# Patient Record
Sex: Female | Born: 1960 | Race: White | Hispanic: No | Smoking: Former smoker
Health system: Southern US, Community
[De-identification: ages and names within clinical notes are randomized; demographics above are authoritative.]

## PROBLEM LIST (undated history)

## (undated) DIAGNOSIS — I499 Cardiac arrhythmia, unspecified: Secondary | ICD-10-CM

## (undated) DIAGNOSIS — K589 Irritable bowel syndrome without diarrhea: Secondary | ICD-10-CM

## (undated) DIAGNOSIS — K5790 Diverticulosis of intestine, part unspecified, without perforation or abscess without bleeding: Secondary | ICD-10-CM

## (undated) DIAGNOSIS — E119 Type 2 diabetes mellitus without complications: Secondary | ICD-10-CM

## (undated) DIAGNOSIS — I10 Essential (primary) hypertension: Secondary | ICD-10-CM

## (undated) DIAGNOSIS — K219 Gastro-esophageal reflux disease without esophagitis: Secondary | ICD-10-CM

## (undated) DIAGNOSIS — E7800 Pure hypercholesterolemia, unspecified: Secondary | ICD-10-CM

## (undated) DIAGNOSIS — M797 Fibromyalgia: Secondary | ICD-10-CM

## (undated) DIAGNOSIS — I509 Heart failure, unspecified: Secondary | ICD-10-CM

## (undated) DIAGNOSIS — Q631 Lobulated, fused and horseshoe kidney: Secondary | ICD-10-CM

## (undated) DIAGNOSIS — IMO0002 Reserved for concepts with insufficient information to code with codable children: Secondary | ICD-10-CM

## (undated) DIAGNOSIS — F419 Anxiety disorder, unspecified: Secondary | ICD-10-CM

## (undated) HISTORY — PX: TONSILLECTOMY: SUR1361

## (undated) HISTORY — DX: Type 2 diabetes mellitus without complications: E11.9

## (undated) HISTORY — DX: Pure hypercholesterolemia, unspecified: E78.00

## (undated) HISTORY — DX: Lobulated, fused and horseshoe kidney: Q63.1

## (undated) HISTORY — PX: TUBAL LIGATION: SHX77

## (undated) HISTORY — PX: WRIST SURGERY: SHX841

## (undated) HISTORY — DX: Reserved for concepts with insufficient information to code with codable children: IMO0002

## (undated) HISTORY — PX: OTHER SURGICAL HISTORY: SHX169

---

## 2001-09-16 ENCOUNTER — Inpatient Hospital Stay (HOSPITAL_COMMUNITY): Admission: EM | Admit: 2001-09-16 | Discharge: 2001-09-19 | Payer: Self-pay | Admitting: Emergency Medicine

## 2001-09-16 ENCOUNTER — Encounter: Payer: Self-pay | Admitting: Emergency Medicine

## 2001-11-27 ENCOUNTER — Inpatient Hospital Stay (HOSPITAL_COMMUNITY): Admission: EM | Admit: 2001-11-27 | Discharge: 2001-11-28 | Payer: Self-pay | Admitting: Emergency Medicine

## 2001-11-28 ENCOUNTER — Encounter: Payer: Self-pay | Admitting: *Deleted

## 2001-12-31 ENCOUNTER — Ambulatory Visit (HOSPITAL_COMMUNITY): Admission: RE | Admit: 2001-12-31 | Discharge: 2001-12-31 | Payer: Self-pay | Admitting: Family Medicine

## 2001-12-31 ENCOUNTER — Encounter: Payer: Self-pay | Admitting: Family Medicine

## 2002-01-23 ENCOUNTER — Ambulatory Visit (HOSPITAL_COMMUNITY): Admission: RE | Admit: 2002-01-23 | Discharge: 2002-01-23 | Payer: Self-pay | Admitting: *Deleted

## 2002-01-23 ENCOUNTER — Encounter: Payer: Self-pay | Admitting: *Deleted

## 2002-04-23 ENCOUNTER — Encounter: Payer: Self-pay | Admitting: Family Medicine

## 2002-04-23 ENCOUNTER — Ambulatory Visit (HOSPITAL_COMMUNITY): Admission: RE | Admit: 2002-04-23 | Discharge: 2002-04-23 | Payer: Self-pay | Admitting: Family Medicine

## 2002-08-01 ENCOUNTER — Emergency Department (HOSPITAL_COMMUNITY): Admission: EM | Admit: 2002-08-01 | Discharge: 2002-08-02 | Payer: Self-pay

## 2003-01-26 ENCOUNTER — Encounter: Payer: Self-pay | Admitting: *Deleted

## 2003-01-26 ENCOUNTER — Emergency Department (HOSPITAL_COMMUNITY): Admission: EM | Admit: 2003-01-26 | Discharge: 2003-01-26 | Payer: Self-pay | Admitting: *Deleted

## 2003-06-06 ENCOUNTER — Emergency Department (HOSPITAL_COMMUNITY): Admission: EM | Admit: 2003-06-06 | Discharge: 2003-06-07 | Payer: Self-pay | Admitting: *Deleted

## 2004-02-09 ENCOUNTER — Emergency Department (HOSPITAL_COMMUNITY): Admission: EM | Admit: 2004-02-09 | Discharge: 2004-02-09 | Payer: Self-pay | Admitting: Emergency Medicine

## 2005-09-01 ENCOUNTER — Ambulatory Visit (HOSPITAL_COMMUNITY): Admission: RE | Admit: 2005-09-01 | Discharge: 2005-09-01 | Payer: Self-pay | Admitting: Emergency Medicine

## 2006-08-10 ENCOUNTER — Emergency Department (HOSPITAL_COMMUNITY): Admission: EM | Admit: 2006-08-10 | Discharge: 2006-08-11 | Payer: Self-pay | Admitting: Emergency Medicine

## 2009-06-29 ENCOUNTER — Encounter: Payer: Self-pay | Admitting: Gastroenterology

## 2009-06-29 ENCOUNTER — Ambulatory Visit: Payer: Self-pay | Admitting: Gastroenterology

## 2009-06-29 DIAGNOSIS — K219 Gastro-esophageal reflux disease without esophagitis: Secondary | ICD-10-CM | POA: Insufficient documentation

## 2009-06-29 DIAGNOSIS — J019 Acute sinusitis, unspecified: Secondary | ICD-10-CM | POA: Insufficient documentation

## 2009-06-29 DIAGNOSIS — K648 Other hemorrhoids: Secondary | ICD-10-CM

## 2009-06-29 DIAGNOSIS — K589 Irritable bowel syndrome without diarrhea: Secondary | ICD-10-CM

## 2009-06-29 DIAGNOSIS — R109 Unspecified abdominal pain: Secondary | ICD-10-CM | POA: Insufficient documentation

## 2009-06-30 ENCOUNTER — Telehealth (INDEPENDENT_AMBULATORY_CARE_PROVIDER_SITE_OTHER): Payer: Self-pay | Admitting: *Deleted

## 2009-07-01 ENCOUNTER — Ambulatory Visit: Payer: Self-pay | Admitting: Gastroenterology

## 2009-07-01 ENCOUNTER — Ambulatory Visit (HOSPITAL_COMMUNITY): Admission: RE | Admit: 2009-07-01 | Discharge: 2009-07-01 | Payer: Self-pay | Admitting: Gastroenterology

## 2009-07-01 HISTORY — PX: ESOPHAGOGASTRODUODENOSCOPY: SHX1529

## 2009-07-01 HISTORY — PX: COLONOSCOPY: SHX174

## 2009-08-17 ENCOUNTER — Encounter: Payer: Self-pay | Admitting: Gastroenterology

## 2009-10-29 ENCOUNTER — Ambulatory Visit (HOSPITAL_COMMUNITY): Admission: RE | Admit: 2009-10-29 | Discharge: 2009-10-29 | Payer: Self-pay | Admitting: Internal Medicine

## 2009-11-10 ENCOUNTER — Ambulatory Visit (HOSPITAL_COMMUNITY): Admission: RE | Admit: 2009-11-10 | Discharge: 2009-11-10 | Payer: Self-pay | Admitting: Internal Medicine

## 2009-11-12 ENCOUNTER — Emergency Department (HOSPITAL_COMMUNITY): Admission: EM | Admit: 2009-11-12 | Discharge: 2009-11-12 | Payer: Self-pay | Admitting: Emergency Medicine

## 2010-08-09 NOTE — Letter (Signed)
Summary: [facility]  NOTES [facility]   Imported By: Diana Eves 08/17/2009 16:07:03  _____________________________________________________________________  External Attachment:    Type:   Image     Comment:   External Document

## 2010-10-10 LAB — BASIC METABOLIC PANEL
BUN: 8 mg/dL (ref 6–23)
CO2: 24 mEq/L (ref 19–32)
Calcium: 9.5 mg/dL (ref 8.4–10.5)
Chloride: 108 mEq/L (ref 96–112)
Creatinine, Ser: 0.79 mg/dL (ref 0.4–1.2)
GFR calc Af Amer: >60 mL/min (ref >60)
GFR calc non Af Amer: >60 mL/min (ref >60)
Glucose, Bld: 165 mg/dL — ABNORMAL HIGH (ref 70–99)
Potassium: 3.8 mEq/L (ref 3.5–5.1)
Sodium: 139 mEq/L (ref 135–145)

## 2010-10-10 LAB — HEMOGLOBIN AND HEMATOCRIT, BLOOD
HCT: 35.8 % — ABNORMAL LOW (ref 36.0–46.0)
Hemoglobin: 12.3 g/dL (ref 12.0–15.0)

## 2010-11-23 ENCOUNTER — Emergency Department (HOSPITAL_COMMUNITY)
Admission: EM | Admit: 2010-11-23 | Discharge: 2010-11-23 | Disposition: A | Discharge status: Home / Self Care | Payer: Self-pay | Attending: Emergency Medicine | Admitting: Emergency Medicine

## 2010-11-23 DIAGNOSIS — F329 Major depressive disorder, single episode, unspecified: Secondary | ICD-10-CM | POA: Insufficient documentation

## 2010-11-23 DIAGNOSIS — Z79899 Other long term (current) drug therapy: Secondary | ICD-10-CM | POA: Insufficient documentation

## 2010-11-23 DIAGNOSIS — F411 Generalized anxiety disorder: Secondary | ICD-10-CM | POA: Insufficient documentation

## 2010-11-23 DIAGNOSIS — K589 Irritable bowel syndrome without diarrhea: Secondary | ICD-10-CM | POA: Insufficient documentation

## 2010-11-23 DIAGNOSIS — F3289 Other specified depressive episodes: Secondary | ICD-10-CM | POA: Insufficient documentation

## 2010-11-23 LAB — DIFFERENTIAL
Basophils Absolute: 0 10*3/uL (ref 0.0–0.1)
Basophils Relative: 0 % (ref 0–1)
Eosinophils Absolute: 0.1 10*3/uL (ref 0.0–0.7)
Lymphs Abs: 1.9 10*3/uL (ref 0.7–4.0)
Monocytes Absolute: 0.6 10*3/uL (ref 0.1–1.0)
Neutro Abs: 5 10*3/uL (ref 1.7–7.7)

## 2010-11-23 LAB — BASIC METABOLIC PANEL
BUN: 11 mg/dL (ref 6–23)
Calcium: 10.9 mg/dL — ABNORMAL HIGH (ref 8.4–10.5)
Chloride: 105 mEq/L (ref 96–112)
Creatinine, Ser: 0.82 mg/dL (ref 0.4–1.2)
GFR calc Af Amer: >60 mL/min (ref >60)
GFR calc non Af Amer: >60 mL/min (ref >60)
Glucose, Bld: 108 mg/dL — ABNORMAL HIGH (ref 70–99)

## 2010-11-23 LAB — CBC
Hemoglobin: 13 g/dL (ref 12.0–15.0)
MCH: 27.7 pg (ref 26.0–34.0)
RBC: 4.69 MIL/uL (ref 3.87–5.11)
WBC: 7.6 10*3/uL (ref 4.0–10.5)

## 2010-11-23 LAB — RAPID URINE DRUG SCREEN, HOSP PERFORMED
Barbiturates: NOT DETECTED
Benzodiazepines: POSITIVE — AB
Tetrahydrocannabinol: NOT DETECTED

## 2010-11-23 LAB — ETHANOL: Alcohol, Ethyl (B): <11 mg/dL — ABNORMAL HIGH (ref 0–10)

## 2010-11-25 NOTE — H&P (Signed)
[facility]  Patient:    [name], [name] Visit Number: [id number] MRN: [id number]          Service Type: MED Location: 3A Y782 01 Attending Physician:  Hilario Quarry Dictated by:   Patrica Duel, M.D. Admit Date:  09/16/2001                           History and Physical  CHIEF COMPLAINT:  Flank pain.  HISTORY OF PRESENT ILLNESS:  This is a 50 year old female with a history of irritable bowel syndrome, cervical dysplasia, horseshoe kidney, status post tonsillectomy in the distant past.  The patient presented to the emergency department with a 10- to 14-day history of increasingly severe left flank pain.  She denies significant GI symptoms except for scant hematochezia.  There is no nausea, vomiting, diarrhea, melena, hematemesis, fever, chills or urinary symptoms.  She also denies headache, neurologic deficits, chest pain, shortness of breath, cough.  Emergency room workup was essentially benign with normal laboratory which included liver functions, amylase, lipase, CBC, MET-7, etc.  A CT scan with contrast was obtained.  No urologic abnormalities were noted other than horseshoe kidney.  No evidence of obstruction.  The verbal report I received stated that there was a possible adenitis involving the left retroperitoneum.  The patient is admitted for full workup of ongoing flank and abdominal pain. Of note, stool is heme-positive in ED.  CURRENT MEDICATIONS:  None.  ALLERGIES:  CODEINE.  PAST MEDICAL HISTORY:  As noted above.  SOCIAL HISTORY:  Nonsmoker (quit three years ago).  No alcohol or illicit drugs.  FAMILY HISTORY:  Positive for colorectal cancer.  REVIEW OF SYSTEMS:  Negative except as mentioned.  PHYSICAL EXAMINATION:  GENERAL:  A very pleasant, fully alert female in no acute distress.  VITAL SIGNS:  Temperature 97.6, pulse 98 and regular, respirations 24, blood pressure 115/59.  HEENT:  Normocephalic, atraumatic.  The  pupils are equal.  There is no scleral icterus.  Ears, nose and throat are benign.  Mucous membranes are moist.  NECK:  Supple without thyromegaly or lymphadenopathy or bruits.  LUNGS:  Clear to A&P.  HEART:  Normal without murmurs, rubs, or gallops.  ABDOMEN:  Soft.  There is moderate tenderness in the left upper quadrant.  She also has some tenderness in the costochondral margin on the left.  EXTREMITIES:  No clubbing, cyanosis, or edema.  PELVIC:  Pelvic exam performed by Dr. Margarita Grizzle reportedly benign.  ASSESSMENT:  Flank and abdominal pain of questionable etiology.  Mesenteric adenitis a possibility.  Note:  Colonoscopy done by Dr. Vania Rea. Patterson in 1996 benign.  PLAN:  Admit for further workup, empiric antibiotics, GI consult.  Will follow and treat expectantly. Dictated by:   Patrica Duel, M.D. Attending Physician:  Hilario Quarry DD:  09/16/01 TD:  09/17/01 Job: [id number]

## 2010-11-25 NOTE — Procedures (Signed)
[facility]  Patient:    [name], [name] Visit Number: [id number] MRN: [id number]          Service Type: OUT Location: RAD Attending Physician:  Netta Cedars Dictated by:   Annamaria Boots, M.D. Proc. Date: 01/23/02 Admit Date:  01/23/2002 Discharge Date: 01/23/2002   CC:         Patrica Duel, M.D.   Stress Test  PROCEDURE:  Stress portion of nuclear stress test.  INDICATIONS:  [name] is a 50 year old white female who had been experiencing chest discomfort, palpitations, and fatigue.  She is subsequently referred for nuclear stress test to evaluate for the possibility of cardiac ischemia.  Persantine Technetium-Based 36m Cardiolite Myocardial perfusion Study:  The patient performed the standard  exercise/pharmocologic stress protocol.  Electrocardiographic and Hematologic Data:  Resting blood pressure was 102/76 while the heart rate was 88 beats per minute.  Baseline 12-lead electrocardiogram revealed normal sinus rhythm at 88 beats per minute with nonspecific ST changes.  Her blood pressure remained stable during the procedure.  There were no additional electrocardiographic changes during the procedure.  She did have some chest discomfort with the Persantine.  These symptoms resolved with admission of IV Aminophylline.  IMPRESSION: 1. Stress portion of nuclear stress test performed without difficulty. 2. Clinically positive for chest discomfort likely secondary to Persantine. 3. Blood pressure and heart rate remained stable. 4. Cineographic images are pending. Dictated by:   Annamaria Boots, M.D. Attending Physician:  Netta Cedars DD:  01/23/02 TD:  01/28/02 Job: [id number]

## 2010-11-25 NOTE — Discharge Summary (Signed)
[facility]  Patient:    [name], [name] Visit Number: [id number] MRN: [id number]          Service Type: MED Location: 3A A319 01 Attending Physician:  Patrica Duel Dictated by:   Patrica Duel, M.D. Admit Date:  09/16/2001 Discharge Date: 09/19/2001                             Discharge Summary  DISCHARGE DIAGNOSES: 1. Flank and abdominal pain of questionable etiology. 2. Mesenteric stranding noted on the CT. 3. Possible panniculitis, responsive to antibiotics.  BRIEF HISTORY:  Details regarding admission please refer to admitting H&P. This 50 year old female with a history of irritable bowel syndrome, cervical dysplasia, horseshoe kidney, status post tonsillectomy in the distant past presented to the emergency department with a 10-14 day history of increasingly severe left flank pain. There were no significant GI symptoms except for scant hematochezia. There was no nausea, vomiting, diarrhea, melena, hematemesis, fever, chills, and urinary symptoms. She also denies headache, neurologic deficits, chest pain, shortness of breath, and cough.  In the emergency department, her workup was essentially benign with normal labs which included liver function, amylase, lipase, cbc, MET-7, etc. A CT scan with contrast was obtained. No urologic abnormalities were noted other than horseshoe kidney and there was no evidence of obstruction. The verbal report I received stated there was a possible adenitis involving the left retroperitoneum. She was admitted for full workup of ongoing flank and abdominal pain. Of note, stool is heme-positive in ED. She also has a history of colorectal carcinoma.  HOSPITAL COURSE:  The patient was admitted and placed on empiric Cipro and Flagyl. She was also placed on PPI. She improved though she did require frequent parenteral narcotics for pain relief. Dr. Karilyn Cota was consulted, and he felt that it could possible by focal  panniculitis. Other possibilities include diverticulitis, pancreatitis, or focal hemorrhage in this area.  The patient continued to improve and was discharged on the third hospital day in much improved condition.  DISPOSITION:  Continue Cipro and Flagyl and Tylox p.r.n. She will be seen by Dr. Karilyn Cota for colonoscopy in the near future. Will follow and treat expectantly as an outpatient. Dictated by:   Patrica Duel, M.D. Attending Physician:  Patrica Duel DD:  10/22/01 TD:  10/22/01 Job: [id number]

## 2010-11-25 NOTE — Discharge Summary (Signed)
[facility]  Patient:    [name], [name] Visit Number: [id number] MRN: [id number]          Service Type: MED Location: 2A A202 01 Attending Physician:  Cassell Smiles. Dictated by:   Elfredia Nevins, M.D. Admit Date:  11/27/2001 Discharge Date: 11/28/2001                             Discharge Summary  DISCHARGE DIAGNOSES:  Chest pain.  DISCHARGE MEDICATIONS: 1. Enteric-coated aspirin 325 mg p.o. q.d. 2. Lopressor 12.5 mg p.o. b.i.d. 3. Nitroglycerin sublingual p.r.n.  SUMMARY:  The patient was admitted with chest discomfort described as intermittent and somewhat exertional in nature.  She was seen in the emergency department after being sent in by the Health Department where she normally receives her care.  She also had complaints of palpitations associated with this.  The patient ruled out effectively with serial enzymes and was seen in consultation by cardiology who agreed with discharge for subsequent outpatient stress test. Dictated by:   Elfredia Nevins, M.D. Attending Physician:  Cassell Smiles DD:  12/13/01 TD:  12/16/01 Job: [id number]

## 2010-11-25 NOTE — H&P (Signed)
[facility]  Patient:    SHARYN, BRILLIANT Visit Number: [id number] MRN: [id number]          Service Type: MED Location: 2A A202 01 Attending Physician:  Cassell Smiles. Dictated by:   Elfredia Nevins, M.D. Admit Date:  11/27/2001 Discharge Date: 11/28/2001                           History and Physical  DATE OF BIRTH:  26-Dec-1960  CHIEF COMPLAINT:  Chest pain.  HISTORY OF PRESENT ILLNESS:  The patient had chest discomfort that has been intermittent and somewhat exertional in nature.  She presented to the emergency department today after being sent in by the health department, where she normally receives her care, with complaints of an irregular heartbeat and exertional pain while mowing the lawn.  She had some shortness of breath associated with this.  No diaphoresis or syncope.  No vomiting.  PAST MEDICAL HISTORY:  Questionable arrhythmia, not documented or worked up at present.  Otherwise, benign.  SOCIAL HISTORY:  Former cigarette smoking, has not smoked in multiple years, x4.  No alcohol use.  No drug use.  FAMILY HISTORY:  Positive for coronary artery disease in several maternal and paternal grandparents.  REVIEW OF SYSTEMS:  Otherwise negative.  PHYSICAL EXAMINATION:  SKIN:  Unremarkable.  HEENT AND NECK:  No JVD or adenopathy.  Neck is supple.  CHEST:  Clear.  CARDIAC:  Regular rhythm without murmur, gallop, or rub.  ABDOMEN:  Soft.  No organomegaly or masses.  EXTREMITIES:  Without clubbing, cyanosis, or edema.  NEUROLOGIC:  Nonfocal.  LABORATORY DATA:  Initial cardiac enzymes negative.  Normal CBC.  Electrolytes unrevealing, normal calcium.  Electrocardiogram reveals normal sinus rhythm with mild nonspecific ST and T-wave changes in V1, 2, and 3.  No definite ischemic or infarction changes. No old EKG was available for comparison.  Chest x-ray report to me is negative by Dr. Rosalia Hammers.  Will review when  available.  IMPRESSION:  Chest pain, exertional precipitation, with positive risk factors. Admit for rule out MI protocol.  Close observation.  Cardiology consultation. Dictated by:   Elfredia Nevins, M.D. Attending Physician:  Cassell Smiles DD:  11/27/01 TD:  11/29/01 Job: [id number]

## 2010-12-09 ENCOUNTER — Inpatient Hospital Stay (HOSPITAL_COMMUNITY)
Admission: EM | Admit: 2010-12-09 | Discharge: 2010-12-10 | DRG: 603 | Disposition: A | Discharge status: Home / Self Care | Payer: Self-pay | Attending: Internal Medicine | Admitting: Internal Medicine

## 2010-12-09 ENCOUNTER — Emergency Department (HOSPITAL_COMMUNITY): Payer: Self-pay

## 2010-12-09 DIAGNOSIS — F329 Major depressive disorder, single episode, unspecified: Secondary | ICD-10-CM | POA: Diagnosis present

## 2010-12-09 DIAGNOSIS — T368X5A Adverse effect of other systemic antibiotics, initial encounter: Secondary | ICD-10-CM | POA: Diagnosis not present

## 2010-12-09 DIAGNOSIS — S61409A Unspecified open wound of unspecified hand, initial encounter: Secondary | ICD-10-CM | POA: Diagnosis present

## 2010-12-09 DIAGNOSIS — W268XXA Contact with other sharp object(s), not elsewhere classified, initial encounter: Secondary | ICD-10-CM | POA: Diagnosis present

## 2010-12-09 DIAGNOSIS — F411 Generalized anxiety disorder: Secondary | ICD-10-CM | POA: Diagnosis present

## 2010-12-09 DIAGNOSIS — L270 Generalized skin eruption due to drugs and medicaments taken internally: Secondary | ICD-10-CM | POA: Diagnosis not present

## 2010-12-09 DIAGNOSIS — L02519 Cutaneous abscess of unspecified hand: Principal | ICD-10-CM | POA: Diagnosis present

## 2010-12-09 DIAGNOSIS — F3289 Other specified depressive episodes: Secondary | ICD-10-CM | POA: Diagnosis present

## 2010-12-09 LAB — DIFFERENTIAL
Eosinophils Relative: 2 % (ref 0–5)
Lymphocytes Relative: 27 % (ref 12–46)
Lymphs Abs: 1.9 10*3/uL (ref 0.7–4.0)
Neutro Abs: 4.4 10*3/uL (ref 1.7–7.7)
Neutrophils Relative %: 63 % (ref 43–77)

## 2010-12-09 LAB — CBC
HCT: 37.6 % (ref 36.0–46.0)
Hemoglobin: 12.6 g/dL (ref 12.0–15.0)
MCH: 28.2 pg (ref 26.0–34.0)
Platelets: 145 10*3/uL — ABNORMAL LOW (ref 150–400)

## 2010-12-09 LAB — COMPREHENSIVE METABOLIC PANEL
BUN: 13 mg/dL (ref 6–23)
CO2: 27 mEq/L (ref 19–32)
Creatinine, Ser: 0.94 mg/dL (ref 0.4–1.2)
GFR calc Af Amer: >60 mL/min (ref >60)
Glucose, Bld: 97 mg/dL (ref 70–99)
Potassium: 3.7 mEq/L (ref 3.5–5.1)
Sodium: 139 mEq/L (ref 135–145)
Total Bilirubin: 0.3 mg/dL (ref 0.3–1.2)
Total Protein: 7 g/dL (ref 6.0–8.3)

## 2010-12-10 LAB — BASIC METABOLIC PANEL
BUN: 11 mg/dL (ref 6–23)
CO2: 25 mEq/L (ref 19–32)
Calcium: 9.9 mg/dL (ref 8.4–10.5)
GFR calc non Af Amer: >60 mL/min (ref >60)
Glucose, Bld: 141 mg/dL — ABNORMAL HIGH (ref 70–99)
Potassium: 4.1 mEq/L (ref 3.5–5.1)
Sodium: 141 mEq/L (ref 135–145)

## 2010-12-10 LAB — DIFFERENTIAL
Basophils Absolute: 0 10*3/uL (ref 0.0–0.1)
Basophils Relative: 0 % (ref 0–1)
Eosinophils Absolute: 0.1 10*3/uL (ref 0.0–0.7)
Eosinophils Relative: 1 % (ref 0–5)
Lymphocytes Relative: 12 % (ref 12–46)

## 2010-12-10 LAB — CBC
MCV: 84.5 fL (ref 78.0–100.0)
RDW: 14.9 % (ref 11.5–15.5)

## 2010-12-10 NOTE — Discharge Summary (Signed)
  [name], [name]             ACCOUNT NO.:  [id number]  MEDICAL RECORD NO.:  [id number]           PATIENT TYPE:  I  LOCATION:  A330                          FACILITY:  [facility]  PHYSICIAN:  Wilson Singer, M.D.DATE OF BIRTH:  1961-01-30  DATE OF ADMISSION:  12/09/2010 DATE OF DISCHARGE:  06/02/2012LH                              DISCHARGE SUMMARY   FINAL DISCHARGE DIAGNOSES: 1. Right hand cellulitis. 2. Puncture wound to the right hand by nail 3. Anxiety and depression.  CONDITION ON DISCHARGE:  Stable.  MEDICATIONS ON DISCHARGE:  She will continue her home medications, which include Xanax 0.5 mg at bedtime, Flexeril p.r.n., Maxalt p.r.n. for anxiety and also a new prescription of Levaquin 500 mg daily for 1 week.  HISTORY OF PRESENT ILLNESS:  This 50 year old lady was admitted yesterday after she had complained of fever and chills and pain in the right hand.  Please see initial history and physical examination done byDr. Vedia Coffer.  HOSPITAL PROGRESS:  The patient had a nail puncture Mark in her right hand and she had sustained cellulitis from this.  Her last tetanus shot was 10 years ago and she had been given an updated tetanus shot here now.  She feels much improved and her cellulitis and redness have significantly subsided.  She did have an adverse reaction to intravenous vancomycin and this should be listed in any of her allergy medications. Today, she looks well.  She feels well.  PHYSICAL EXAMINATION:  VITAL SIGNS:  Temperature 98.5, blood pressure 120/79, pulse 80, saturation 100%.  HEART:  Sounds are present.  LUNGS: Normal lung fields are clear.  Examination of the right hand show some minimal cellulitis now around the site of the puncture wound from the nail.  This is greatly improved from the extent of cellulitis previously.  INVESTIGATIONS:  Hemoglobin 12.2, white blood cell count 7.4, platelets of 134.  Sodium 141, potassium 4.1, bicarbonate 25, BUN  11, creatinine 0.68.  DISPOSITION:  The patient is stable to be discharged home, but she will need a 1-week course of antibiotics and I have given her prescription for Levaquin 500 mg daily for a week.  She needs to follow up with her primary care physician's, which I think her the Health Department within a week or so.     Wilson Singer, M.D.     NCG/MEDQ  D:  12/10/2010  T:  12/10/2010  Job:  [id number]  Electronically Signed by Lilly Cove M.D. on 12/10/2010 02:16:09 PM

## 2010-12-11 NOTE — H&P (Signed)
[name], [name]             ACCOUNT NO.:  [id number]  MEDICAL RECORD NO.:  [id number]           PATIENT TYPE:  I  LOCATION:  A330                          FACILITY:  [facility]  PHYSICIAN:  Vania Rea, M.D. DATE OF BIRTH:  02/03/1961  DATE OF ADMISSION:  12/09/2010 DATE OF DISCHARGE:  LH                             HISTORY & PHYSICAL   PRIMARY CARE PHYSICIAN:  Unassigned.  CHIEF COMPLAINT:  Pain in the right hand, fever, and chills.  HISTORY OF PRESENT ILLNESS:  This is a 50 year old Caucasian lady who reports on the night prior to admission she was beating her wall because she heard some noises in the wall and inadvertently slammed her hand into a nail that was sticking in the wall.  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  In theemergency room, the patient was seen.  A puncture mark was noted and area of the puncture marks on her hand was red and inflamed. Information was spreading up to her wrist.  She was given antibiotics and fluids but spiked a temperature and had shaking chills in the emergency room.  Her condition was discussed with the orthopedic surgeon who recommended admission for intravenous antibiotics if she was not improving, otherwise she could follow up with him as an outpatient. Hospitalist Service was called to assist with management.  The patient denies any other problems, chest pain, shortness of breath. She denies any abdominal surgery other than tubal ligation.  PAST MEDICAL HISTORY:  Anxiety and allergies, depression, migraines, irritable bowel syndrome, history of horseshoe kidney.  Past surgical history includes tonsillectomy, tubal ligation, left wrist surgery for fracture.  MEDICATIONS: 1. Xanax 0.5 mg at bedtime p.r.n. 2. Maxalt p.r.n. for anxiety. 3.  Flexeril p.r.n. 4. Sublingual nitroglycerin p.r.n. for apparently ventricular     ectopics.  Allergies to CODEINE which causes nausea.  SOCIAL HISTORY:  Denies tobacco, alcohol, or illicit drug use.  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  FAMILY HISTORY:  Significant for cardiac disease, diabetes, and thyroid disease.  REVIEW OF SYSTEMS:  Other than noted above unremarkable.  PHYSICAL EXAMINATION:  GENERAL:  Apprehensive middle-aged Caucasian lady reclining in the stretcher. VITALS:  Temperature 98.2, respiration 22, pulse 90, blood pressure 130/80, saturating at 99% on room air. HEENT:  Her pupils are round and equal.  Mucous membranes pink. Anicteric.  No cervical lymphadenopathy or thyromegaly.  No carotid bruit.  No jugular venous distention. CHEST:  Clear to auscultation bilaterally. CARDIOVASCULAR:  Regular rhythm.  No murmur. ABDOMEN:  Scaphoid, soft, nontender. EXTREMITIES:  She has a puncture wound in the left hypothenar eminence and erythema spreading up the wrist.  There is no other swelling.  She does have tenderness in the right axilla, but no discrete nodes are identified. CENTRAL NERVOUS SYSTEM:  Cranial nerves II through XII are grossly intact.  She has no focal lateralizing signs.  LABORATORY DATA:  Her white count is 7.0, hemoglobin 12.6, platelets 145.  She has a normal differential.  Complete metabolic panel is reviewed and all her labs were normal.  Her calcium is slightly elevated at 10.6, her BUN is 39, her creatinine is 0.94.  X-rays of her hand showed no acute findings, specifically no foreign body.  ASSESSMENT: 1. Cellulitis of the right upper extremity. 2. Puncture wound to the right hand nail. 3. Anxiety and depression.  PLAN: 1. We will bring this lady on observation for treatment of her     cellulitis.  We will give vancomycin and Zosyn.  If swelling and     redness has subsided significantly over the next 24  hours, she can     probably be discharge to continue on oral antibiotics therapy. 2. We will hydrate. 3. We will give p.r.n. treatment for her anxiety. 4. Other plans as per orders.  The patient had a severe allergic reaction to VANCOMYCIN.  She became red and itching all over and began to have swelling and discomfort in her throat but responded promptly to intravenous Benadryl.     Vania Rea, M.D.     LC/MEDQ  D:  12/10/2010  T:  12/10/2010  Job:  [id number]  cc:   [facility].  Electronically Signed by Vania Rea M.D. on 12/11/2010 05:25:35 AM

## 2011-05-17 ENCOUNTER — Other Ambulatory Visit (HOSPITAL_COMMUNITY): Payer: Self-pay | Admitting: Physician Assistant

## 2011-05-17 DIAGNOSIS — Z139 Encounter for screening, unspecified: Secondary | ICD-10-CM

## 2011-05-22 ENCOUNTER — Other Ambulatory Visit: Payer: Self-pay | Admitting: Obstetrics and Gynecology

## 2011-05-22 DIAGNOSIS — Z139 Encounter for screening, unspecified: Secondary | ICD-10-CM

## 2011-05-23 ENCOUNTER — Ambulatory Visit (HOSPITAL_COMMUNITY): Payer: Self-pay

## 2011-05-29 ENCOUNTER — Ambulatory Visit (HOSPITAL_COMMUNITY): Payer: Self-pay

## 2011-06-05 ENCOUNTER — Ambulatory Visit (HOSPITAL_COMMUNITY): Payer: Self-pay

## 2011-07-24 ENCOUNTER — Encounter (HOSPITAL_COMMUNITY): Payer: Self-pay | Admitting: *Deleted

## 2011-07-24 ENCOUNTER — Emergency Department (HOSPITAL_COMMUNITY)
Admission: EM | Admit: 2011-07-24 | Discharge: 2011-07-24 | Disposition: A | Discharge status: Home / Self Care | Payer: Self-pay | Attending: Physician Assistant | Admitting: Physician Assistant

## 2011-07-24 DIAGNOSIS — K589 Irritable bowel syndrome without diarrhea: Secondary | ICD-10-CM | POA: Insufficient documentation

## 2011-07-24 DIAGNOSIS — IMO0001 Reserved for inherently not codable concepts without codable children: Secondary | ICD-10-CM | POA: Insufficient documentation

## 2011-07-24 DIAGNOSIS — R059 Cough, unspecified: Secondary | ICD-10-CM | POA: Insufficient documentation

## 2011-07-24 DIAGNOSIS — J111 Influenza due to unidentified influenza virus with other respiratory manifestations: Secondary | ICD-10-CM | POA: Insufficient documentation

## 2011-07-24 DIAGNOSIS — R509 Fever, unspecified: Secondary | ICD-10-CM | POA: Insufficient documentation

## 2011-07-24 DIAGNOSIS — J3489 Other specified disorders of nose and nasal sinuses: Secondary | ICD-10-CM | POA: Insufficient documentation

## 2011-07-24 DIAGNOSIS — R51 Headache: Secondary | ICD-10-CM | POA: Insufficient documentation

## 2011-07-24 DIAGNOSIS — R05 Cough: Secondary | ICD-10-CM | POA: Insufficient documentation

## 2011-07-24 HISTORY — DX: Irritable bowel syndrome, unspecified: K58.9

## 2011-07-24 MED ORDER — PSEUDOEPHEDRINE HCL 60 MG PO TABS: ORAL_TABLET | ORAL | Status: DC

## 2011-07-24 MED ORDER — OSELTAMIVIR PHOSPHATE 75 MG PO CAPS: 75.0000 mg | ORAL_CAPSULE | Freq: Two times a day (BID) | ORAL | Status: AC

## 2011-07-24 MED ORDER — ONDANSETRON HCL 4 MG PO TABS
4.0000 mg | ORAL_TABLET | Freq: Once | ORAL | Status: AC
  Administered 2011-07-24: 4 mg via ORAL
  Filled 2011-07-24: qty 1

## 2011-07-24 MED ORDER — PROMETHAZINE-CODEINE 6.25-10 MG/5ML PO SYRP: 5.0000 mL | ORAL_SOLUTION | Freq: Four times a day (QID) | ORAL | Status: AC | PRN

## 2011-07-24 MED ORDER — OSELTAMIVIR PHOSPHATE 75 MG PO CAPS
75.0000 mg | ORAL_CAPSULE | Freq: Once | ORAL | Status: AC
  Administered 2011-07-24: 75 mg via ORAL
  Filled 2011-07-24: qty 1

## 2011-07-24 MED ORDER — HYDROCOD POLST-CHLORPHEN POLST 10-8 MG/5ML PO LQCR
5.0000 mL | Freq: Once | ORAL | Status: AC
Start: 2011-07-24 — End: 2011-07-24
  Administered 2011-07-24: 5 mL via ORAL
  Filled 2011-07-24: qty 5

## 2011-07-24 MED ORDER — IBUPROFEN 800 MG PO TABS
800.0000 mg | ORAL_TABLET | Freq: Once | ORAL | Status: AC
  Administered 2011-07-24: 800 mg via ORAL
  Filled 2011-07-24: qty 1

## 2011-07-24 NOTE — ED Notes (Signed)
Fever, chills, body aches taking robitussin without relief

## 2011-08-23 NOTE — ED Provider Notes (Signed)
History     CSN: [id number]  Arrival date & time 07/24/11  1937   None     Chief Complaint  Patient presents with  . Fever    (Consider location/radiation/quality/duration/timing/severity/associated sxs/prior treatment) Patient is a 51 y.o. female presenting with cough. The history is provided by the patient.  Cough This is a new problem. The current episode started yesterday. The problem occurs constantly. The problem has been gradually worsening. The cough is non-productive. The maximum temperature recorded prior to her arrival was 100 to 100.9 F. Associated symptoms include chills, headaches and myalgias. Pertinent negatives include no chest pain, no shortness of breath and no wheezing. She has tried nothing for the symptoms. She is not a smoker. Her past medical history is significant for bronchitis.    Past Medical History  Diagnosis Date  . Kidney disease   . Irritable bowel syndrome   . Kidney disease     Past Surgical History  Procedure Date  . Tonsillectomy   . Tubal ligation   . Orthopedic surgery     No family history on file.  History  Substance Use Topics  . Smoking status: Former Games developer  . Smokeless tobacco: Not on file  . Alcohol Use: No    OB History    Grav Para Term Preterm Abortions TAB SAB Ect Mult Living                  Review of Systems  Constitutional: Positive for chills. Negative for activity change.       All ROS Neg except as noted in HPI  HENT: Negative for nosebleeds and neck pain.   Eyes: Negative for photophobia and discharge.  Respiratory: Positive for cough. Negative for shortness of breath and wheezing.   Cardiovascular: Negative for chest pain and palpitations.  Gastrointestinal: Negative for abdominal pain and blood in stool.  Genitourinary: Negative for dysuria, frequency and hematuria.  Musculoskeletal: Positive for myalgias. Negative for back pain and arthralgias.  Skin: Negative.   Neurological: Positive for  headaches. Negative for dizziness, seizures and speech difficulty.  Psychiatric/Behavioral: Negative for hallucinations and confusion.    Allergies  Penicillins  Home Medications   Current Outpatient Rx  Name Route Sig Dispense Refill  . PSEUDOEPHEDRINE HCL 60 MG PO TABS  1 tablet 3 times a day for congestion 30 tablet 0    BP 121/90  Pulse 82  Temp(Src) 99.2 F (37.3 C) (Oral)  Resp 20  SpO2 99%  Physical Exam  Nursing note and vitals reviewed. Constitutional: She is oriented to person, place, and time. She appears well-developed and well-nourished.  Non-toxic appearance.  HENT:  Head: Normocephalic.  Right Ear: Tympanic membrane and external ear normal.  Left Ear: Tympanic membrane and external ear normal.       Nasal congestion present.  Eyes: EOM and lids are normal. Pupils are equal, round, and reactive to light.  Neck: Normal range of motion. Neck supple. Carotid bruit is not present.  Cardiovascular: Normal rate, regular rhythm, normal heart sounds, intact distal pulses and normal pulses.   Pulmonary/Chest: Breath sounds normal. No respiratory distress.  Abdominal: Soft. Bowel sounds are normal. There is no tenderness. There is no guarding.  Musculoskeletal: Normal range of motion.  Lymphadenopathy:       Head (right side): No submandibular adenopathy present.       Head (left side): No submandibular adenopathy present.    She has no cervical adenopathy.  Neurological: She is alert and oriented to  person, place, and time. She has normal strength. No cranial nerve deficit or sensory deficit.  Skin: Skin is warm and dry.  Psychiatric: She has a normal mood and affect. Her speech is normal.    ED Course  Procedures (including critical care time)  Labs Reviewed - No data to display No results found.   1. Influenza       MDM  I have reviewed nursing notes, vital signs, and all appropriate lab and imaging results for this patient.  Pt treated with Tamaflu,  Tussionex  And increase fluids. Pt to return if not improving.    Kathie Dike, [place] 08/23/11 2211

## 2011-08-28 NOTE — ED Provider Notes (Signed)
Medical screening examination/treatment/procedure(s) were performed by non-physician practitioner and as supervising physician I was immediately available for consultation/collaboration.   Shelda Jakes, MD 08/28/11 1130

## 2011-09-03 ENCOUNTER — Other Ambulatory Visit: Payer: Self-pay

## 2011-09-03 ENCOUNTER — Emergency Department (HOSPITAL_COMMUNITY)
Admission: EM | Admit: 2011-09-03 | Discharge: 2011-09-03 | Disposition: A | Discharge status: Home / Self Care | Payer: Self-pay | Attending: Emergency Medicine | Admitting: Emergency Medicine

## 2011-09-03 ENCOUNTER — Encounter (HOSPITAL_COMMUNITY): Payer: Self-pay

## 2011-09-03 ENCOUNTER — Emergency Department (HOSPITAL_COMMUNITY): Payer: Self-pay

## 2011-09-03 DIAGNOSIS — M5030 Other cervical disc degeneration, unspecified cervical region: Secondary | ICD-10-CM | POA: Insufficient documentation

## 2011-09-03 DIAGNOSIS — M542 Cervicalgia: Secondary | ICD-10-CM | POA: Insufficient documentation

## 2011-09-03 DIAGNOSIS — M25519 Pain in unspecified shoulder: Secondary | ICD-10-CM | POA: Insufficient documentation

## 2011-09-03 DIAGNOSIS — Z79899 Other long term (current) drug therapy: Secondary | ICD-10-CM | POA: Insufficient documentation

## 2011-09-03 MED ORDER — PREDNISONE 20 MG PO TABS: 20.0000 mg | ORAL_TABLET | Freq: Two times a day (BID) | ORAL | Status: AC

## 2011-09-03 MED ORDER — OXYCODONE-ACETAMINOPHEN 5-325 MG PO TABS: 1.0000 | ORAL_TABLET | ORAL | Status: AC | PRN

## 2011-09-03 NOTE — ED Provider Notes (Signed)
History     CSN: [id number]  Arrival date & time 09/03/11  1453   First MD Initiated Contact with Patient 09/03/11 1533      Chief Complaint  Patient presents with  . Shoulder Pain  . Neck Pain    (Consider location/radiation/quality/duration/timing/severity/associated sxs/prior treatment) Patient is a 51 y.o. female presenting with shoulder pain. The history is provided by the patient.  Shoulder Pain This is a new problem. The current episode started more than 2 days ago. The problem occurs constantly. The problem has been gradually worsening. Associated symptoms include headaches. The symptoms are aggravated by nothing. The symptoms are relieved by nothing. She has tried acetaminophen (ibuprofen) for the symptoms. The treatment provided no relief.   [name] is a 51 y.o. female who presents to the Emergency Department complaining of acute onset moderate to severe pain in her shoulder, neck, and head for 6 days. Pt treated with tylenol, ibuprofen, and OTC pain patches with little to no improvement. Pt has some associated nausea and  dizziness but denies any vomiting or any trouble walking. Pt also has a shooting pain down right arm. Pt has on and off chest pain about twice a day with arm pain. She has a burning sensation in legs for which she is taking arthritis medication.   Past Medical History  Diagnosis Date  . Kidney disease   . Irritable bowel syndrome   . Kidney disease     Past Surgical History  Procedure Date  . Tonsillectomy   . Tubal ligation   . Orthopedic surgery     No family history on file.  History  Substance Use Topics  . Smoking status: Former Games developer  . Smokeless tobacco: Not on file  . Alcohol Use: No    OB History    Grav Para Term Preterm Abortions TAB SAB Ect Mult Living                  Review of Systems  Neurological: Positive for headaches.   10 Systems reviewed and are negative for acute change except as noted in the  HPI.  Allergies  Penicillins  Home Medications   Current Outpatient Rx  Name Route Sig Dispense Refill  . ACETAMINOPHEN 500 MG PO TABS Oral Take 2,000 mg by mouth every 6 (six) hours as needed. pain    . CLONAZEPAM 1 MG PO TABS Oral Take 1 mg by mouth 3 (three) times daily.    . CYCLOBENZAPRINE HCL 10 MG PO TABS Oral Take 10 mg by mouth 3 (three) times daily as needed.    Marland Kitchen RIZATRIPTAN BENZOATE 10 MG PO TABS Oral Take 10 mg by mouth as needed. May repeat in 2 hours if needed    . OXYCODONE-ACETAMINOPHEN 5-325 MG PO TABS Oral Take 1 tablet by mouth every 4 (four) hours as needed for pain. 15 tablet 0  . PREDNISONE 20 MG PO TABS Oral Take 1 tablet (20 mg total) by mouth 2 (two) times daily. 10 tablet 0    BP 134/87  Pulse 89  Temp(Src) 97.9 F (36.6 C) (Oral)  Resp 20  Ht 5\' 2"  (1.575 m)  Wt 160 lb (72.576 kg)  BMI 29.26 kg/m2  SpO2 100%  Physical Exam  Nursing note and vitals reviewed. Constitutional: She is oriented to person, place, and time. She appears well-developed and well-nourished.  HENT:  Head: Normocephalic and atraumatic.  Eyes: Conjunctivae are normal. Pupils are equal, round, and reactive to light.  Neck: Normal  range of motion. Neck supple.       Tenderness of left trapezius  Cardiovascular: Normal rate, regular rhythm and normal heart sounds.   Pulmonary/Chest: Effort normal and breath sounds normal.  Abdominal: Soft. There is no tenderness.  Musculoskeletal:       Strength normal  Neurological: She is alert and oriented to person, place, and time.  Skin: Skin is warm and dry.  Psychiatric: She has a normal mood and affect. Her behavior is normal.    ED Course  Procedures (including critical care time) 5:36 PM Recheck: Scans indicate arthritis. Pt prescribed prednisone and is ready for discharge.  DIAGNOSTIC STUDIES: Oxygen Saturation is 100% on room air, normal by my interpretation.    COORDINATION OF CARE:  Medications  cyclobenzaprine  (FLEXERIL) 10 MG tablet (not administered)  rizatriptan (MAXALT) 10 MG tablet (not administered)  nabumetone (RELAFEN) 750 MG tablet (not administered)  meloxicam (MOBIC) 7.5 MG tablet (not administered)  clonazePAM (KLONOPIN) 1 MG tablet (not administered)  acetaminophen (TYLENOL) 500 MG tablet (not administered)  predniSONE (DELTASONE) 20 MG tablet (not administered)  oxyCODONE-acetaminophen (PERCOCET) 5-325 MG per tablet (not administered)      Labs Reviewed - No data to display Dg Cervical Spine Complete  09/03/2011  *RADIOLOGY REPORT*  Clinical Data: 51 year old female with neck and shoulder pain.  CERVICAL SPINE - COMPLETE 4+ VIEW  Comparison: 11/10/2009 MRI  Findings: Normal alignment is noted. There is no evidence of fracture, subluxation or prevertebral soft tissue swelling. Degenerative disc disease and spondylosis again noted, mild at C4- C5 and C5-C6 and moderate at C6-C7. No definite bony foraminal narrowing is identified. No focal bony lesions are present.  IMPRESSION: No acute abnormalities.  Degenerative changes within the cervical spine again identified.  Original Report Authenticated By: Rosendo Gros, M.D.    Date: 09/03/2011  Rate: 86  Rhythm: normal sinus rhythm  QRS Axis: normal  Intervals: normal  ST/T Wave abnormalities: normal  Conduction Disutrbances:none  Narrative Interpretation:   Old EKG Reviewed: none available   1. Degenerative disc disease, cervical       MDM  Patient's musculoskeletal discomfort is likely due to to her cervical spine degenerative disc disease and spondylosis.   I personally performed the services described in this documentation, which was scribed in my presence. The recorded information has been reviewed and considered.        Flint Melter, MD 09/03/11 [phone]

## 2011-09-03 NOTE — ED Notes (Signed)
Pt reports having left side neck pain for 6 days, pain has moved down to left shoulder, also having numbness and tingling to right arm.  Has not seen a doctor for this, pain worse today,denies any n/v or fever. No cough or chills, denies any known injury.

## 2011-09-03 NOTE — ED Notes (Signed)
Awaiting xray results.

## 2011-09-03 NOTE — ED Notes (Signed)
Pt reports pain in left side of neck radiating into head, left ear, left eye, jaw, and shoulder x 6 days.  Reports has been having nausea and dizziness.  Pt says pain is worse with movement.  Pt also reports has had some chest pain but thought was indigestion.  Reports history of arrythmia.

## 2011-09-03 NOTE — Discharge Instructions (Signed)
Use heat on the sore areas 3-4 times a day. See your primary care Dr. for a checkup in a few days.  Degenerative Disc Disease Degenerative disc disease is a condition caused by the changes that occur in the cushions of the backbone (spinal discs) as you grow older. Spinal discs are soft and compressible discs located between the bones of the spine (vertebrae). They act like shock absorbers. Degenerative disc disease can affect the wholespine. However, the neck and lower back are most commonly affected. Many changes can occur in the spinal discs with aging, such as:  The spinal discs may dry and shrink.   Small tears may occur in the tough, outer covering of the disc (annulus).   The disc space may become smaller due to loss of water.   Abnormal growths in the bone (spurs) may occur. This can put pressure on the nerve roots exiting the spinal canal, causing pain.   The spinal canal may become narrowed.  CAUSES  Degenerative disc disease is a condition caused by the changes that occur in the spinal discs with aging. The exact cause is not known, but there is a genetic basis for many patients. Degenerative changes can occur due to loss of fluid in the disc. This makes the disc thinner and reduces the space between the backbones. Small cracks can develop in the outer layer of the disc. This can lead to the breakdown of the disc. You are more likely to get degenerative disc disease if you are overweight. Smoking cigarettes and doing heavy work such as weightlifting can also increase your risk of this condition. Degenerative changes can start after a sudden injury. Growth of bone spurs can compress the nerve roots and cause pain.  SYMPTOMS  The symptoms vary from person to person. Some people may have no pain, while others have severe pain. The pain may be so severe that it can limit your activities. The location of the pain depends on the part of your backbone that is affected. You will have neck or arm  pain if a disc in the neck area is affected. You will have pain in your back, buttocks, or legs if a disc in the lower back is affected. The pain becomes worse while bending, reaching up, or with twisting movements. The pain may start gradually and then get worse as time passes. It may also start after a major or minor injury. You may feel numbness or tingling in the arms or legs.  DIAGNOSIS  Your caregiver will ask you about your symptoms and about activities or habits that may cause the pain. He or she may also ask about any injuries, diseases, ortreatments you have had earlier. Your caregiver will examine you to check for the range of movement that is possible in the affected area, to check for strength in your extremities, and to check for sensation in the areas of the arms and legs supplied by different nerve roots. An X-ray of the spine may be taken. Your caregiver may suggest other imaging tests, such as a computerized magnetic scan (MRI), if needed.  TREATMENT  Treatment includes rest, modifying your activities, and applying ice and heat. Your caregiver may prescribe medicines to reduce your pain and may ask you to do some exercises to strengthen your back. In some cases, you may need surgery. You and your caregiver will decide on the treatment that is best for you. HOME CARE INSTRUCTIONS   Follow proper lifting and walking techniques as advised by  your caregiver.   Maintain good posture.   Exercise regularly as advised.   Perform relaxation exercises.   Change your sitting, standing, and sleeping habits as advised. Change positions frequently.   Lose weight as advised.   Stop smoking if you smoke.   Wear supportive footwear.  SEEK MEDICAL CARE IF:  The pain does not go away within 1 to 4 weeks. SEEK IMMEDIATE MEDICAL CARE IF:   The pain is severe.   You notice weakness in your arms, hands, or legs.   You begin to lose control of your bladder or bowel.  MAKE SURE YOU:    Understand these instructions.   Will watch your condition.   Will get help right away if you are not doing well or get worse.  Document Released: 04/23/2007 Document Revised: 03/08/2011 Document Reviewed: 04/23/2007 [facility] Patient Information 2012 [place], [place].

## 2012-04-20 ENCOUNTER — Encounter (HOSPITAL_COMMUNITY): Payer: Self-pay | Admitting: *Deleted

## 2012-04-20 DIAGNOSIS — Z880 Allergy status to penicillin: Secondary | ICD-10-CM | POA: Insufficient documentation

## 2012-04-20 DIAGNOSIS — K589 Irritable bowel syndrome without diarrhea: Secondary | ICD-10-CM | POA: Insufficient documentation

## 2012-04-20 DIAGNOSIS — N390 Urinary tract infection, site not specified: Secondary | ICD-10-CM | POA: Insufficient documentation

## 2012-04-20 DIAGNOSIS — Z87891 Personal history of nicotine dependence: Secondary | ICD-10-CM | POA: Insufficient documentation

## 2012-04-20 DIAGNOSIS — Q638 Other specified congenital malformations of kidney: Secondary | ICD-10-CM | POA: Insufficient documentation

## 2012-04-20 NOTE — ED Notes (Signed)
Pt c/o right side flank pain. Pt states she had a "nagging" pain to right side and states has gotten worse today. Pt states she had a kidney infection a couple months ago.

## 2012-04-21 ENCOUNTER — Emergency Department (HOSPITAL_COMMUNITY)
Admission: EM | Admit: 2012-04-21 | Discharge: 2012-04-21 | Disposition: A | Discharge status: Home / Self Care | Payer: Self-pay | Attending: Emergency Medicine | Admitting: Emergency Medicine

## 2012-04-21 DIAGNOSIS — N390 Urinary tract infection, site not specified: Secondary | ICD-10-CM

## 2012-04-21 DIAGNOSIS — R109 Unspecified abdominal pain: Secondary | ICD-10-CM

## 2012-04-21 LAB — URINALYSIS, ROUTINE W REFLEX MICROSCOPIC
Hgb urine dipstick: NEGATIVE
Nitrite: NEGATIVE
Specific Gravity, Urine: 1.025 (ref 1.005–1.030)
Urobilinogen, UA: 0.2 mg/dL (ref 0.0–1.0)

## 2012-04-21 LAB — URINE MICROSCOPIC-ADD ON

## 2012-04-21 MED ORDER — HYDROCODONE-ACETAMINOPHEN 5-325 MG PO TABS: 1.0000 | ORAL_TABLET | ORAL | Status: AC | PRN

## 2012-04-21 MED ORDER — CYCLOBENZAPRINE HCL 10 MG PO TABS
10.0000 mg | ORAL_TABLET | Freq: Once | ORAL | Status: AC
  Administered 2012-04-21: 10 mg via ORAL
  Filled 2012-04-21: qty 1

## 2012-04-21 MED ORDER — CIPROFLOXACIN HCL 250 MG PO TABS
250.0000 mg | ORAL_TABLET | Freq: Once | ORAL | Status: AC
  Administered 2012-04-21: 250 mg via ORAL
  Filled 2012-04-21: qty 1

## 2012-04-21 MED ORDER — ONDANSETRON HCL 4 MG PO TABS: 4.0000 mg | ORAL_TABLET | Freq: Four times a day (QID) | ORAL | Status: DC

## 2012-04-21 MED ORDER — HYDROCODONE-ACETAMINOPHEN 5-325 MG PO TABS
1.0000 | ORAL_TABLET | Freq: Once | ORAL | Status: AC
  Administered 2012-04-21: 1 via ORAL
  Filled 2012-04-21: qty 1

## 2012-04-21 MED ORDER — IBUPROFEN 800 MG PO TABS
800.0000 mg | ORAL_TABLET | Freq: Once | ORAL | Status: AC
  Administered 2012-04-21: 800 mg via ORAL
  Filled 2012-04-21: qty 1

## 2012-04-21 MED ORDER — ONDANSETRON 4 MG PO TBDP
4.0000 mg | ORAL_TABLET | Freq: Once | ORAL | Status: AC
  Administered 2012-04-21: 4 mg via ORAL
  Filled 2012-04-21: qty 1

## 2012-04-21 MED ORDER — CIPROFLOXACIN HCL 500 MG PO TABS: 250.0000 mg | ORAL_TABLET | Freq: Two times a day (BID) | ORAL | Status: DC

## 2012-04-21 NOTE — ED Notes (Signed)
Patient is concerned about her low blood pressure.  States she does not usually run this low.

## 2012-04-21 NOTE — ED Provider Notes (Signed)
History     CSN: [id number]  Arrival date & time 04/20/12  2334   First MD Initiated Contact with Patient 04/21/12 0030      Chief Complaint  Patient presents with  . Flank Pain    (Consider location/radiation/quality/duration/timing/severity/associated sxs/prior treatment) HPI [name] is a 51 y.o. female who presents to the Emergency Department complaining of right flank pain and right side pain that began several days ago and became worse today. Pain is worse with palpation and movement. She is not aware of any injury. She works as a Lawyer in a nursing home. Denies fever, chills, urinary symptoms, nausea, vomiting or diarrhea.    Past Medical History  Diagnosis Date  . Irritable bowel syndrome   . Kidney disease   . Kidney disease     Past Surgical History  Procedure Date  . Tonsillectomy   . Tubal ligation   . Orthopedic surgery     History reviewed. No pertinent family history.  History  Substance Use Topics  . Smoking status: Former Games developer  . Smokeless tobacco: Not on file  . Alcohol Use: No    OB History    Grav Para Term Preterm Abortions TAB SAB Ect Mult Living                  Review of Systems  Constitutional: Negative for fever.       10 Systems reviewed and are negative for acute change except as noted in the HPI.  HENT: Negative for congestion.   Eyes: Negative for discharge and redness.  Respiratory: Negative for cough and shortness of breath.   Cardiovascular: Negative for chest pain.  Gastrointestinal: Negative for vomiting and abdominal pain.  Genitourinary: Positive for flank pain.  Musculoskeletal: Negative for back pain.       Right side pain  Skin: Negative for rash.  Neurological: Negative for syncope, numbness and headaches.  Psychiatric/Behavioral:       No behavior change.    Allergies  Penicillins  Home Medications   Current Outpatient Rx  Name Route Sig Dispense Refill  . ACETAMINOPHEN 500 MG PO TABS Oral  Take 2,000 mg by mouth every 6 (six) hours as needed. pain    . CLONAZEPAM 1 MG PO TABS Oral Take 1 mg by mouth 3 (three) times daily.    . CYCLOBENZAPRINE HCL 10 MG PO TABS Oral Take 10 mg by mouth 3 (three) times daily as needed.    Marland Kitchen RIZATRIPTAN BENZOATE 10 MG PO TABS Oral Take 10 mg by mouth as needed. May repeat in 2 hours if needed      BP 108/81  Pulse 92  Temp 97.4 F (36.3 C) (Oral)  Resp 18  Ht 5\' 1"  (1.549 m)  Wt 170 lb (77.111 kg)  BMI 32.12 kg/m2  SpO2 100%  Physical Exam  Nursing note and vitals reviewed. Constitutional: She appears well-developed and well-nourished.       Awake, alert, nontoxic appearance.  HENT:  Head: Atraumatic.  Eyes: Right eye exhibits no discharge. Left eye exhibits no discharge.  Neck: Neck supple.  Cardiovascular: Normal heart sounds.   Pulmonary/Chest: Effort normal and breath sounds normal. She exhibits no tenderness.  Abdominal: Soft. Bowel sounds are normal. There is no tenderness. There is no rebound.  Musculoskeletal: She exhibits no tenderness.       Baseline ROM, no obvious new focal weakness.no spinal tenderness to percussion, no paraspinal muscle tenderness to palpation.  Neurological:  Mental status and motor strength appears baseline for patient and situation.  Skin: No rash noted.  Psychiatric: She has a normal mood and affect.    ED Course  Procedures (including critical care time)  Results for orders placed during the hospital encounter of 04/21/12  URINALYSIS, ROUTINE W REFLEX MICROSCOPIC      Component Value Range   Color, Urine YELLOW  YELLOW   APPearance CLEAR  CLEAR   Specific Gravity, Urine 1.025  1.005 - 1.030   pH 6.0  5.0 - 8.0   Glucose, UA NEGATIVE  NEGATIVE mg/dL   Hgb urine dipstick NEGATIVE  NEGATIVE   Bilirubin Urine NEGATIVE  NEGATIVE   Ketones, ur NEGATIVE  NEGATIVE mg/dL   Protein, ur NEGATIVE  NEGATIVE mg/dL   Urobilinogen, UA 0.2  0.0 - 1.0 mg/dL   Nitrite NEGATIVE  NEGATIVE    Leukocytes, UA TRACE (*) NEGATIVE  URINE MICROSCOPIC-ADD ON      Component Value Range   Squamous Epithelial / LPF FEW (*) RARE   WBC, UA 3-6  <3 WBC/hpf   RBC / HPF 0-2  <3 RBC/hpf   Bacteria, UA FEW (*) RARE      MDM  Patient who works as a Lawyer here with right flank pain. Given analgesic and antiemetic with improvement. UA with trace LE and 3-6 wbc. 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 Suspect pain is muscular. Will treat beginning UTI since she has a kidney anomaly. Dx testing d/w pt and husband.  Questions answered.  Verb understanding, agreeable to d/c home with outpt f/u. Pt feels improved after observation and/or treatment in ED.Pt stable in ED with no significant deterioration in condition.The patient appears reasonably screened and/or stabilized for discharge and I doubt any other medical condition or other [facility] requiring further screening, evaluation, or treatment in the ED at this time prior to discharge.  MDM Reviewed: nursing note and vitals Interpretation: labs            Nicoletta Dress. Colon Branch, MD 04/21/12 [phone]

## 2012-04-21 NOTE — ED Notes (Addendum)
Up to bathroom    C/o nausea without emesis.  States the nausea comes and goes - was also present prior to medication

## 2012-04-21 NOTE — ED Notes (Signed)
Ambulatory to bathroom to void.

## 2012-07-26 ENCOUNTER — Emergency Department (HOSPITAL_COMMUNITY): Payer: Self-pay

## 2012-07-26 ENCOUNTER — Emergency Department (HOSPITAL_COMMUNITY)
Admission: EM | Admit: 2012-07-26 | Discharge: 2012-07-26 | Disposition: A | Discharge status: Home / Self Care | Payer: Self-pay | Attending: Emergency Medicine | Admitting: Emergency Medicine

## 2012-07-26 ENCOUNTER — Encounter (HOSPITAL_COMMUNITY): Payer: Self-pay | Admitting: *Deleted

## 2012-07-26 DIAGNOSIS — W19XXXA Unspecified fall, initial encounter: Secondary | ICD-10-CM

## 2012-07-26 DIAGNOSIS — N189 Chronic kidney disease, unspecified: Secondary | ICD-10-CM | POA: Insufficient documentation

## 2012-07-26 DIAGNOSIS — Z87891 Personal history of nicotine dependence: Secondary | ICD-10-CM | POA: Insufficient documentation

## 2012-07-26 DIAGNOSIS — M549 Dorsalgia, unspecified: Secondary | ICD-10-CM

## 2012-07-26 DIAGNOSIS — M545 Low back pain, unspecified: Secondary | ICD-10-CM | POA: Insufficient documentation

## 2012-07-26 DIAGNOSIS — Y939 Activity, unspecified: Secondary | ICD-10-CM | POA: Insufficient documentation

## 2012-07-26 DIAGNOSIS — M79609 Pain in unspecified limb: Secondary | ICD-10-CM | POA: Insufficient documentation

## 2012-07-26 DIAGNOSIS — Y92009 Unspecified place in unspecified non-institutional (private) residence as the place of occurrence of the external cause: Secondary | ICD-10-CM | POA: Insufficient documentation

## 2012-07-26 DIAGNOSIS — W010XXA Fall on same level from slipping, tripping and stumbling without subsequent striking against object, initial encounter: Secondary | ICD-10-CM | POA: Insufficient documentation

## 2012-07-26 DIAGNOSIS — Z79899 Other long term (current) drug therapy: Secondary | ICD-10-CM | POA: Insufficient documentation

## 2012-07-26 DIAGNOSIS — T148XXA Other injury of unspecified body region, initial encounter: Secondary | ICD-10-CM

## 2012-07-26 DIAGNOSIS — K589 Irritable bowel syndrome without diarrhea: Secondary | ICD-10-CM | POA: Insufficient documentation

## 2012-07-26 DIAGNOSIS — IMO0002 Reserved for concepts with insufficient information to code with codable children: Secondary | ICD-10-CM | POA: Insufficient documentation

## 2012-07-26 MED ORDER — CYCLOBENZAPRINE HCL 10 MG PO TABS: 10.0000 mg | ORAL_TABLET | Freq: Two times a day (BID) | ORAL | Status: DC | PRN

## 2012-07-26 MED ORDER — OXYCODONE-ACETAMINOPHEN 5-325 MG PO TABS
2.0000 | ORAL_TABLET | Freq: Once | ORAL | Status: AC
  Administered 2012-07-26: 2 via ORAL
  Filled 2012-07-26: qty 2

## 2012-07-26 NOTE — ED Provider Notes (Signed)
Medical screening examination/treatment/procedure(s) were performed by non-physician practitioner and as supervising physician I was immediately available for consultation/collaboration. Devoria Albe, MD, FACEP   Ward Givens, MD 07/26/12 [phone]

## 2012-07-26 NOTE — ED Provider Notes (Signed)
History     CSN: [id number]  Arrival date & time 07/26/12  4098   First MD Initiated Contact with Patient 07/26/12 0830      Chief Complaint  Patient presents with  . Back Pain    (Consider location/radiation/quality/duration/timing/severity/associated sxs/prior treatment) HPI Comments: 52 year old female presents to the emergency department complaining of low back pain after falling on Saturday. She was visiting someone in prison when she went to the bathroom and slipped on the wet bathroom floor. States she landed on her back. Denies hitting her head or loss of consciousness. Initially her back was sore, however the pain is worsened and has become more achy over the past few days. Pain rated 10 out of 10. She's tried taking Tylenol and hydrocodone without relief. Admits to associated pain radiating down the left buttock into her thigh. Denies numbness or tingling. No loss of control of bowels or bladder or saddle anesthesia.  Patient is a 52 y.o. female presenting with back pain. The history is provided by the patient.  Back Pain  Pertinent negatives include no numbness and no weakness.    Past Medical History  Diagnosis Date  . Irritable bowel syndrome   . Kidney disease   . Kidney disease     Past Surgical History  Procedure Date  . Tonsillectomy   . Tubal ligation   . Orthopedic surgery     No family history on file.  History  Substance Use Topics  . Smoking status: Former Games developer  . Smokeless tobacco: Not on file  . Alcohol Use: No    OB History    Grav Para Term Preterm Abortions TAB SAB Ect Mult Living                  Review of Systems  HENT: Negative for neck pain.   Musculoskeletal: Positive for back pain.  Skin: Negative for color change.  Neurological: Negative for weakness and numbness.  All other systems reviewed and are negative.    Allergies  Penicillins  Home Medications   Current Outpatient Rx  Name  Route  Sig  Dispense  Refill  .  ACETAMINOPHEN 500 MG PO TABS   Oral   Take 2,000 mg by mouth every 6 (six) hours as needed. pain         . CIPROFLOXACIN HCL 500 MG PO TABS   Oral   Take 0.5 tablets (250 mg total) by mouth 2 (two) times daily.   14 tablet   0   . CLONAZEPAM 1 MG PO TABS   Oral   Take 1 mg by mouth 3 (three) times daily.         . CYCLOBENZAPRINE HCL 10 MG PO TABS   Oral   Take 10 mg by mouth 3 (three) times daily as needed.         . IBUPROFEN 200 MG PO TABS   Oral   Take 200 mg by mouth every 6 (six) hours as needed.         Marland Kitchen ONDANSETRON HCL 4 MG PO TABS   Oral   Take 1 tablet (4 mg total) by mouth every 6 (six) hours.   12 tablet   0   . RIZATRIPTAN BENZOATE 10 MG PO TABS   Oral   Take 10 mg by mouth as needed. May repeat in 2 hours if needed           BP 125/76  Pulse 84  Temp 98.1 F (36.7 C) (  Oral)  Resp 20  Ht 5\' 2"  (1.575 m)  Wt 162 lb (73.483 kg)  BMI 29.63 kg/m2  SpO2 98%  Physical Exam  Nursing note and vitals reviewed. Constitutional: She is oriented to person, place, and time. She appears well-developed and well-nourished. No distress.  HENT:  Head: Normocephalic and atraumatic.  Eyes: Conjunctivae normal and EOM are normal. Pupils are equal, round, and reactive to light.  Neck: Normal range of motion. Neck supple.  Cardiovascular: Normal rate, regular rhythm, normal heart sounds and intact distal pulses.   Pulmonary/Chest: Effort normal and breath sounds normal.  Abdominal: Soft. Bowel sounds are normal. There is no tenderness.  Musculoskeletal:       Thoracic back: Normal.       Lumbar back: She exhibits tenderness, bony tenderness and spasm. She exhibits normal range of motion, no edema and normal pulse.  Neurological: She is alert and oriented to person, place, and time. She has normal strength. No sensory deficit. Gait normal.  Skin: Skin is warm and dry. No bruising and no ecchymosis noted.  Psychiatric: She has a normal mood and affect. Her  behavior is normal.    ED Course  Procedures (including critical care time)  Labs Reviewed - No data to display Dg Lumbar Spine Complete  07/26/2012  *RADIOLOGY REPORT*  Clinical Data: Back pain  LUMBAR SPINE - COMPLETE 4+ VIEW  Comparison: 08/11/1980  Findings: Normal alignment of the lumbar spine.  The vertebral body heights are well preserved.  There is mild disc space narrowing and ventral endplate spurring noted. Most notable at L5-S1.  No fractures identified.  IMPRESSION:  1.  Lumbar spondylosis.   Original Report Authenticated By: Signa Kell, M.D.      1. Fall   2. Bone bruise   3. Back pain       MDM  52 y/o female with back pain s/p fall. Xray without any acute bony abnormality. Pain improved with percocet. She needs a note and copies of records for potential law suit. Rx flexeril. Advised rest, ice and ibuprofen. Return precautions discussed. No red flags concerning patient's back pain. No s/s of central cord compression or cauda equina. Lower extremities are neurovascularly intact and patient is ambulating without difficulty. Patient states understanding of plan and is agreeable.         Trevor Mace, PA-C 07/26/12 [phone]

## 2012-07-26 NOTE — ED Notes (Signed)
C/o lower back pain s/p fall 6 days ago; ambulatory to tx area with steady gait in no apparent distress.

## 2012-07-26 NOTE — ED Notes (Signed)
Patient was in Room 5.  Advised patient she could not be visiting other patients.  Returned patient to her room.

## 2012-12-17 ENCOUNTER — Other Ambulatory Visit (HOSPITAL_COMMUNITY): Payer: Self-pay | Admitting: Family Medicine

## 2012-12-17 DIAGNOSIS — M5416 Radiculopathy, lumbar region: Secondary | ICD-10-CM

## 2012-12-19 ENCOUNTER — Ambulatory Visit (HOSPITAL_COMMUNITY)
Admission: RE | Admit: 2012-12-19 | Discharge: 2012-12-19 | Disposition: A | Discharge status: Home / Self Care | Payer: BC Managed Care – PPO | Source: Ambulatory Visit | Attending: Family Medicine | Admitting: Family Medicine

## 2012-12-19 DIAGNOSIS — M545 Low back pain, unspecified: Secondary | ICD-10-CM | POA: Insufficient documentation

## 2012-12-19 DIAGNOSIS — M5416 Radiculopathy, lumbar region: Secondary | ICD-10-CM

## 2012-12-19 DIAGNOSIS — M5126 Other intervertebral disc displacement, lumbar region: Secondary | ICD-10-CM | POA: Insufficient documentation

## 2013-02-15 ENCOUNTER — Emergency Department (HOSPITAL_COMMUNITY)
Admission: EM | Admit: 2013-02-15 | Discharge: 2013-02-16 | Disposition: A | Discharge status: Home / Self Care | Payer: BC Managed Care – PPO | Attending: Emergency Medicine | Admitting: Emergency Medicine

## 2013-02-15 ENCOUNTER — Encounter (HOSPITAL_COMMUNITY): Payer: Self-pay | Admitting: *Deleted

## 2013-02-15 ENCOUNTER — Emergency Department (HOSPITAL_COMMUNITY): Payer: BC Managed Care – PPO

## 2013-02-15 DIAGNOSIS — Z87448 Personal history of other diseases of urinary system: Secondary | ICD-10-CM | POA: Insufficient documentation

## 2013-02-15 DIAGNOSIS — M549 Dorsalgia, unspecified: Secondary | ICD-10-CM | POA: Insufficient documentation

## 2013-02-15 DIAGNOSIS — R112 Nausea with vomiting, unspecified: Secondary | ICD-10-CM | POA: Insufficient documentation

## 2013-02-15 DIAGNOSIS — R109 Unspecified abdominal pain: Secondary | ICD-10-CM | POA: Insufficient documentation

## 2013-02-15 DIAGNOSIS — Z79899 Other long term (current) drug therapy: Secondary | ICD-10-CM | POA: Insufficient documentation

## 2013-02-15 DIAGNOSIS — Z87891 Personal history of nicotine dependence: Secondary | ICD-10-CM | POA: Insufficient documentation

## 2013-02-15 DIAGNOSIS — R911 Solitary pulmonary nodule: Secondary | ICD-10-CM | POA: Insufficient documentation

## 2013-02-15 DIAGNOSIS — Z8719 Personal history of other diseases of the digestive system: Secondary | ICD-10-CM | POA: Insufficient documentation

## 2013-02-15 DIAGNOSIS — R1012 Left upper quadrant pain: Secondary | ICD-10-CM | POA: Insufficient documentation

## 2013-02-15 LAB — CBC WITH DIFFERENTIAL/PLATELET
Basophils Absolute: 0 10*3/uL (ref 0.0–0.1)
Basophils Relative: 0 % (ref 0–1)
HCT: 41 % (ref 36.0–46.0)
Hemoglobin: 14.7 g/dL (ref 12.0–15.0)
Lymphocytes Relative: 25 % (ref 12–46)
Monocytes Absolute: 0.7 10*3/uL (ref 0.1–1.0)
Monocytes Relative: 8 % (ref 3–12)
Neutro Abs: 5.6 10*3/uL (ref 1.7–7.7)
Neutrophils Relative %: 64 % (ref 43–77)
RDW: 14.2 % (ref 11.5–15.5)
WBC: 8.7 10*3/uL (ref 4.0–10.5)

## 2013-02-15 LAB — URINALYSIS, ROUTINE W REFLEX MICROSCOPIC
Bilirubin Urine: NEGATIVE
Nitrite: NEGATIVE
Specific Gravity, Urine: 1.015 (ref 1.005–1.030)
Urobilinogen, UA: 0.2 mg/dL (ref 0.0–1.0)
pH: 5.5 (ref 5.0–8.0)

## 2013-02-15 LAB — URINE MICROSCOPIC-ADD ON

## 2013-02-15 LAB — COMPREHENSIVE METABOLIC PANEL
BUN: 14 mg/dL (ref 6–23)
CO2: 25 mEq/L (ref 19–32)
Calcium: 10.5 mg/dL (ref 8.4–10.5)
Creatinine, Ser: 0.88 mg/dL (ref 0.50–1.10)
GFR calc Af Amer: 86 mL/min — ABNORMAL LOW (ref >90)
GFR calc non Af Amer: 74 mL/min — ABNORMAL LOW (ref >90)
Glucose, Bld: 139 mg/dL — ABNORMAL HIGH (ref 70–99)
Total Protein: 7 g/dL (ref 6.0–8.3)

## 2013-02-15 MED ORDER — MORPHINE SULFATE 4 MG/ML IJ SOLN
4.0000 mg | Freq: Once | INTRAMUSCULAR | Status: AC
  Administered 2013-02-15: 4 mg via INTRAVENOUS
  Filled 2013-02-15: qty 1

## 2013-02-15 MED ORDER — HYDROMORPHONE HCL PF 1 MG/ML IJ SOLN
1.0000 mg | Freq: Once | INTRAMUSCULAR | Status: AC
  Administered 2013-02-16: 1 mg via INTRAVENOUS
  Filled 2013-02-15: qty 1

## 2013-02-15 MED ORDER — SODIUM CHLORIDE 0.9 % IV BOLUS (SEPSIS)
1000.0000 mL | Freq: Once | INTRAVENOUS | Status: AC
  Administered 2013-02-15: 1000 mL via INTRAVENOUS

## 2013-02-15 MED ORDER — GI COCKTAIL ~~LOC~~
30.0000 mL | Freq: Once | ORAL | Status: AC
  Administered 2013-02-16: 30 mL via ORAL
  Filled 2013-02-15: qty 30

## 2013-02-15 MED ORDER — ONDANSETRON HCL 4 MG/2ML IJ SOLN
4.0000 mg | Freq: Once | INTRAMUSCULAR | Status: AC
  Administered 2013-02-15: 4 mg via INTRAVENOUS
  Filled 2013-02-15: qty 2

## 2013-02-15 NOTE — ED Notes (Addendum)
2145  Pt arrives to the room via wheelchair with family.  Pt is tearful and C/O pain in the abdominal area midabdominal on the left side with pain that radiates to the back.  Pt is also nauseated at this time.  Will continue to monitor the pt.  2300  Pt off the floor to CT.

## 2013-02-15 NOTE — ED Provider Notes (Signed)
CSN: [id number]     Arrival date & time 02/15/13  2055 History     First MD Initiated Contact with Patient 02/15/13 2149     Chief Complaint  Patient presents with  . Abdominal Pain   (Consider location/radiation/quality/duration/timing/severity/associated sxs/prior Treatment) HPI 52 year old female with history of irritable bowel syndrome, or should any, and presents with left upper quadrant pain radiating to the back. Patient reports pain and suddenly about 3 hours ago and is severe in nature, tender 10. Reports that it feels "like labor pains". Reports that she tried using suppositories and muscle relaxants however has gotten no relief. Denies any constipation, diarrhea, bloody or black bowel movements, hematemesis. She is nauseous and has vomited twice. Denies any other vaginal discharge or vaginal bleeding. Denies dysuria or fever.  Past Medical History  Diagnosis Date  . Irritable bowel syndrome   . Kidney disease   . Kidney disease    Past Surgical History  Procedure Laterality Date  . Tonsillectomy    . Tubal ligation    . Orthopedic surgery     No family history on file. History  Substance Use Topics  . Smoking status: Former Games developer  . Smokeless tobacco: Not on file  . Alcohol Use: No   OB History   Grav Para Term Preterm Abortions TAB SAB Ect Mult Living                 Review of Systems  Constitutional: Negative for fever.  HENT: Negative for sore throat and neck pain.   Eyes: Negative for visual disturbance.  Respiratory: Negative for cough and shortness of breath.   Cardiovascular: Negative for chest pain.  Gastrointestinal: Positive for nausea, vomiting and abdominal pain. Negative for diarrhea and constipation.  Genitourinary: Positive for flank pain. Negative for vaginal bleeding, vaginal discharge and difficulty urinating.  Musculoskeletal: Positive for back pain.  Skin: Negative for rash.  Neurological: Negative for syncope and headaches.     Allergies  Penicillins  Home Medications   Current Outpatient Rx  Name  Route  Sig  Dispense  Refill  . acetaminophen (TYLENOL) 650 MG CR tablet   Oral   Take 1,300 mg by mouth daily as needed for pain.         Marland Kitchen aspirin-acetaminophen-caffeine (EXCEDRIN MIGRAINE) 250-250-65 MG per tablet   Oral   Take 2 tablets by mouth daily as needed (for headache).         . bisacodyl (DULCOLAX) 10 MG suppository   Rectal   Place 10 mg rectally daily as needed for constipation.         . clonazePAM (KLONOPIN) 1 MG tablet   Oral   Take 1 mg by mouth 3 (three) times daily as needed for anxiety.          . cyclobenzaprine (FLEXERIL) 10 MG tablet   Oral   Take 10 mg by mouth 3 (three) times daily as needed for muscle spasms.          . pravastatin (PRAVACHOL) 40 MG tablet   Oral   Take 40 mg by mouth every morning.         . trolamine salicylate (ASPERCREME) 10 % cream   Topical   Apply 1 application topically as needed (for muscle pain).          BP 136/90  Pulse 89  Temp(Src) 97.2 F (36.2 C) (Oral)  Resp 20  SpO2 97% Physical Exam  Nursing note and vitals reviewed. Constitutional: She is  oriented to person, place, and time. She appears well-developed and well-nourished. No distress.  HENT:  Head: Normocephalic and atraumatic.  Eyes: Conjunctivae and EOM are normal.  Neck: Normal range of motion.  Cardiovascular: Normal rate, regular rhythm, normal heart sounds and intact distal pulses.  Exam reveals no gallop and no friction rub.   No murmur heard. Pulmonary/Chest: Effort normal and breath sounds normal. No respiratory distress. She has no wheezes. She has no rales.  Abdominal: Soft. She exhibits no distension. There is tenderness (LUQ). There is CVA tenderness. There is no guarding.  Musculoskeletal: She exhibits no edema and no tenderness.  Neurological: She is alert and oriented to person, place, and time.  Skin: Skin is warm and dry. No rash noted. She  is not diaphoretic. No erythema.    ED Course   Procedures (including critical care time)  Labs Reviewed  COMPREHENSIVE METABOLIC PANEL - Abnormal; Notable for the following:    Glucose, Bld 139 (*)    GFR calc non Af Amer 74 (*)    GFR calc Af Amer 86 (*)    All other components within normal limits  CBC WITH DIFFERENTIAL - Abnormal; Notable for the following:    Platelets 138 (*)    All other components within normal limits  URINALYSIS, ROUTINE W REFLEX MICROSCOPIC - Abnormal; Notable for the following:    APPearance CLOUDY (*)    Leukocytes, UA TRACE (*)    All other components within normal limits  URINE MICROSCOPIC-ADD ON   No results found. No diagnosis found.  MDM  52 year old female with history of irritable bowel syndrome, or should any, and presents with left upper quadrant pain radiating to the back. Differential diagnosis includes pancreatitis, peptic ulcer disease, nephrolithiasis, pyelonephritis, diverticulitis. Urinalysis does not suggest urinary tract infection, and suspicion for pyelonephritis is low. Lipase is within normal limits. CMP shows electrolytes within normal limits. 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 Patient's pain improved with Dilaudid, Zofran, gi cocktail.  Patient denies chest pain, shortness of breath, has LUQ tenderness and a low suspicion that her LUQ pain indicates cardiac cause.  Other etiologies of pain include gastritis, GERD and recommended that patient take over-the-counter antacids. Patient was otherwise told to monitor for other symptoms, follow up with her primary care physician and return to ED if symptoms worse.  Patient discharged in stable condition with understanding of reasons to return.  Care discussed with Dr. Silverio Lay.  Rhae Lerner, MD 02/16/13 0120

## 2013-02-15 NOTE — ED Notes (Addendum)
Pt c/o LUQ abdominal pain, and epigastric pain with N/V. N/V x 2. LBM 02/15/2013

## 2013-02-15 NOTE — ED Provider Notes (Signed)
I have supervised the resident on the management of this patient and agree with the note above. I personally interviewed and examined the patient and my addendum is below.   [name] is a 52 y.o. female hx of IBS here with abdominal pain. LUQ pain radiate to the back for the last 3 hours. Nauseous, no vomiting. On exam, tender LUQ, L CVAT. Will do CT ab/pel to r/o kidney stone.   CT showed no acute changes. She has horse shoe kidney (congenital). Felt better after pain meds and GI cocktail. No evidence of kidney stone.   Results for orders placed during the hospital encounter of 02/15/13  COMPREHENSIVE METABOLIC PANEL      Result Value Range   Sodium 137  135 - 145 mEq/L   Potassium 3.7  3.5 - 5.1 mEq/L   Chloride 102  96 - 112 mEq/L   CO2 25  19 - 32 mEq/L   Glucose, Bld 139 (*) 70 - 99 mg/dL   BUN 14  6 - 23 mg/dL   Creatinine, Ser 1.61  0.50 - 1.10 mg/dL   Calcium 09.6  8.4 - 04.5 mg/dL   Total Protein 7.0  6.0 - 8.3 g/dL   Albumin 3.9  3.5 - 5.2 g/dL   AST 17  0 - 37 U/L   ALT 15  0 - 35 U/L   Alkaline Phosphatase 89  39 - 117 U/L   Total Bilirubin 0.3  0.3 - 1.2 mg/dL   GFR calc non Af Amer 74 (*) >90 mL/min   GFR calc Af Amer 86 (*) >90 mL/min  CBC WITH DIFFERENTIAL      Result Value Range   WBC 8.7  4.0 - 10.5 K/uL   RBC 4.67  3.87 - 5.11 MIL/uL   Hemoglobin 14.7  12.0 - 15.0 g/dL   HCT 40.9  81.1 - 91.4 %   MCV 87.8  78.0 - 100.0 fL   MCH 31.5  26.0 - 34.0 pg   MCHC 35.9  30.0 - 36.0 g/dL   RDW 78.2  95.6 - 21.3 %   Platelets 138 (*) 150 - 400 K/uL   Neutrophils Relative % 64  43 - 77 %   Neutro Abs 5.6  1.7 - 7.7 K/uL   Lymphocytes Relative 25  12 - 46 %   Lymphs Abs 2.2  0.7 - 4.0 K/uL   Monocytes Relative 8  3 - 12 %   Monocytes Absolute 0.7  0.1 - 1.0 K/uL   Eosinophils Relative 3  0 - 5 %   Eosinophils Absolute 0.2  0.0 - 0.7 K/uL   Basophils Relative 0  0 - 1 %   Basophils Absolute 0.0  0.0 - 0.1 K/uL  URINALYSIS, ROUTINE W REFLEX MICROSCOPIC       Result Value Range   Color, Urine YELLOW  YELLOW   APPearance CLOUDY (*) CLEAR   Specific Gravity, Urine 1.015  1.005 - 1.030   pH 5.5  5.0 - 8.0   Glucose, UA NEGATIVE  NEGATIVE mg/dL   Hgb urine dipstick NEGATIVE  NEGATIVE   Bilirubin Urine NEGATIVE  NEGATIVE   Ketones, ur NEGATIVE  NEGATIVE mg/dL   Protein, ur NEGATIVE  NEGATIVE mg/dL   Urobilinogen, UA 0.2  0.0 - 1.0 mg/dL   Nitrite NEGATIVE  NEGATIVE   Leukocytes, UA TRACE (*) NEGATIVE  URINE MICROSCOPIC-ADD ON      Result Value Range   Squamous Epithelial / LPF FEW (*) RARE  WBC, UA 0-2  <3 WBC/hpf   Bacteria, UA FEW (*) RARE   Urine-Other MUCOUS PRESENT    LIPASE, BLOOD      Result Value Range   Lipase 19  11 - 59 U/L   Ct Abdomen Pelvis Wo Contrast  02/15/2013   *RADIOLOGY REPORT*  Clinical Data: Left upper quadrant and flank pain with nausea and vomiting.  History of irritable bowel syndrome.  CT ABDOMEN AND PELVIS WITHOUT CONTRAST  Technique:  Multidetector CT imaging of the abdomen and pelvis was performed following the standard protocol without intravenous contrast.  Comparison: Abdominal pelvic CT 08/11/2006.  Findings: Images through the lung bases demonstrate stable nodularity with a 3 mm subpleural right middle lobe nodule on image number 11.  The left lower lobe, there is a 6 mm nodule on image number two which was not previously imaged.  There is no pleural or pericardial effusion.  There is mild nodularity of the breast parenchyma which is similar to the prior study.  The liver, gallbladder, spleen and pancreas appear normal.  There is no adrenal mass.  Horseshoe kidney has a stable appearance.  No renal calculi or hydronephrosis demonstrated.  There are mild diverticular changes in the distal colon without surrounding inflammation.  The stomach and small bowel appear normal.  There are no enlarged lymph nodes.  There is mild aorto iliac atherosclerosis. The uterus, ovaries and bladder appear unremarkable.  Degenerative  disc disease at L5-S1 has progressed.  No acute osseous findings are demonstrated.  IMPRESSION:  1.  No acute findings or explanation for left flank pain and vomiting demonstrated. 2.  Stable appearance of the horseshoe kidney without hydronephrosis or inflammatory change. 3.  Progressive degenerative disc disease at L5-S1. 4.  Nodularity in both lung bases.  Those visualized previously are stable.  There is a 6 mm left lower lobe nodule which was not previously imaged. If the patient is at high risk for bronchogenic carcinoma, follow-up chest CT at 6-12 months is recommended.  If the patient is at low risk for bronchogenic carcinoma, follow-up chest CT at 12 months is recommended.  This recommendation follows the consensus statement: Guidelines for Management of Small Pulmonary Nodules Detected on CT Scans: A Statement from the Fleischner Society as published in Radiology 2005; 237:395-400.   Original Report Authenticated By: Carey Bullocks, M.D.       Richardean Canal, MD 02/16/13 [phone]

## 2013-03-04 ENCOUNTER — Encounter: Payer: Self-pay | Admitting: Gastroenterology

## 2013-03-05 ENCOUNTER — Ambulatory Visit (INDEPENDENT_AMBULATORY_CARE_PROVIDER_SITE_OTHER): Payer: BC Managed Care – PPO | Admitting: Gastroenterology

## 2013-03-05 ENCOUNTER — Encounter: Payer: Self-pay | Admitting: Gastroenterology

## 2013-03-05 VITALS — BP 113/73 | HR 85 | Temp 98.5°F | Ht 62.0 in | Wt 153.6 lb

## 2013-03-05 DIAGNOSIS — R1310 Dysphagia, unspecified: Secondary | ICD-10-CM

## 2013-03-05 DIAGNOSIS — R198 Other specified symptoms and signs involving the digestive system and abdomen: Secondary | ICD-10-CM

## 2013-03-05 DIAGNOSIS — R194 Change in bowel habit: Secondary | ICD-10-CM

## 2013-03-05 MED ORDER — PEG-KCL-NACL-NASULF-NA ASC-C 100 G PO SOLR: 1.0000 | ORAL | Status: DC

## 2013-03-05 MED ORDER — DEXLANSOPRAZOLE 60 MG PO CPDR: 60.0000 mg | DELAYED_RELEASE_CAPSULE | Freq: Every day | ORAL | Status: DC

## 2013-03-05 MED ORDER — HYOSCYAMINE SULFATE 0.125 MG SL SUBL: 0.1250 mg | SUBLINGUAL_TABLET | SUBLINGUAL | Status: DC | PRN

## 2013-03-05 NOTE — Patient Instructions (Addendum)
We have scheduled you for a colonoscopy and upper endoscopy with dilation with Dr. Darrick Penna in the near future.  Start taking Dexilant each morning. This is for reflux. I have provided samples and forms for assistance.  I have sent to the pharmacy a medication called Levsin for abdominal pain. Make sure you are eating a high fiber diet and avoiding any fatty, greasy foods. Please see attached.  High-Fiber Diet Fiber is found in fruits, vegetables, and grains. A high-fiber diet encourages the addition of more whole grains, legumes, fruits, and vegetables in your diet. The recommended amount of fiber for adult males is 38 g per day. For adult females, it is 25 g per day. Pregnant and lactating women should get 28 g of fiber per day. If you have a digestive or bowel problem, ask your caregiver for advice before adding high-fiber foods to your diet. Eat a variety of high-fiber foods instead of only a select few type of foods.  PURPOSE  To increase stool bulk.  To make bowel movements more regular to prevent constipation.  To lower cholesterol.  To prevent overeating. WHEN IS THIS DIET USED?  It may be used if you have constipation and hemorrhoids.  It may be used if you have uncomplicated diverticulosis (intestine condition) and irritable bowel syndrome.  It may be used if you need help with weight management.  It may be used if you want to add it to your diet as a protective measure against atherosclerosis, diabetes, and cancer. SOURCES OF FIBER  Whole-grain breads and cereals.  Fruits, such as apples, oranges, bananas, berries, prunes, and pears.  Vegetables, such as green peas, carrots, sweet potatoes, beets, broccoli, cabbage, spinach, and artichokes.  Legumes, such split peas, soy, lentils.  Almonds. FIBER CONTENT IN FOODS Starches and Grains / Dietary Fiber (g)  Cheerios, 1 cup / 3 g  Corn Flakes cereal, 1 cup / 0.7 g  Rice crispy treat cereal, 1 cup / 0.3 g  Instant  oatmeal (cooked),  cup / 2 g  Frosted wheat cereal, 1 cup / 5.1 g  Brown, long-grain rice (cooked), 1 cup / 3.5 g  White, long-grain rice (cooked), 1 cup / 0.6 g  Enriched macaroni (cooked), 1 cup / 2.5 g Legumes / Dietary Fiber (g)  Baked beans (canned, plain, or vegetarian),  cup / 5.2 g  Kidney beans (canned),  cup / 6.8 g  Pinto beans (cooked),  cup / 5.5 g Breads and Crackers / Dietary Fiber (g)  Plain or honey graham crackers, 2 squares / 0.7 g  Saltine crackers, 3 squares / 0.3 g  Plain, salted pretzels, 10 pieces / 1.8 g  Whole-wheat bread, 1 slice / 1.9 g  White bread, 1 slice / 0.7 g  Raisin bread, 1 slice / 1.2 g  Plain bagel, 3 oz / 2 g  Flour tortilla, 1 oz / 0.9 g  Corn tortilla, 1 small / 1.5 g  Hamburger or hotdog bun, 1 small / 0.9 g Fruits / Dietary Fiber (g)  Apple with skin, 1 medium / 4.4 g  Sweetened applesauce,  cup / 1.5 g  Banana,  medium / 1.5 g  Grapes, 10 grapes / 0.4 g  Orange, 1 small / 2.3 g  Raisin, 1.5 oz / 1.6 g  Melon, 1 cup / 1.4 g Vegetables / Dietary Fiber (g)  Green beans (canned),  cup / 1.3 g  Carrots (cooked),  cup / 2.3 g  Broccoli (cooked),  cup / 2.8  g  Peas (cooked),  cup / 4.4 g  Mashed potatoes,  cup / 1.6 g  Lettuce, 1 cup / 0.5 g  Corn (canned),  cup / 1.6 g  Tomato,  cup / 1.1 g Document Released: 06/26/2005 Document Revised: 12/26/2011 Document Reviewed: 09/28/2011 [facility] Patient Information 2014 [place], [place].

## 2013-03-05 NOTE — Progress Notes (Signed)
Primary Care Physician:  Isabella Stalling, MD Primary Gastroenterologist:  Dr. Darrick Penna   Chief Complaint  Patient presents with  . Dysphagia    HPI:   Dysphagia progressing. Solid food dysphagia. "gets stuck" cervical area. Pill dysphagia. Has to drink behind it. Tries to eat soft foods so it will slide on down. Abdominal cramping, staying on commode "too much". Dealing with more constipation than diarrhea. Starts cramping, "getting ready", usually 5-30 minutes after eating something will finally trigger her. Has to be near a bathroom or she is incontinent. Denies loose stool. Hemorrhoid stays out. "push up all the time". Gets real hot on the commode. Almost feels like labor. Sometimes will feel so nauseated she will throw up. BM usually once to every other day. Legs will get numb when on toilet. Hollers out.  Intermittent nausea, unrelated to bowel habits. Epigastric "funny feelings", discomfort. Not associated with eating/drinking. No melena. Takes Tums all the time.   Has taken Omeprazole in past. No insurance for medication.   Past Medical History  Diagnosis Date  . Irritable bowel syndrome   . Horseshoe kidney   . DDD (degenerative disc disease)     spinal injections  . Hypercholesterolemia     Past Surgical History  Procedure Laterality Date  . Tonsillectomy    . Tubal ligation    . Wrist surgery    . Colonoscopy  07/01/2009    [id number] seen in the ascending and sigmoid colon/small internal hemorrhoids/6-mm sessile polyp/4-mm sessile polyp.3-mm sessile polyp removed. simple adenomas  . 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 Cervical dysplasia      Current Outpatient Prescriptions  Medication Sig Dispense Refill  . acetaminophen (TYLENOL) 650 MG CR tablet Take 1,300 mg by mouth daily as needed for pain.      Marland Kitchen aspirin-acetaminophen-caffeine (EXCEDRIN MIGRAINE) 250-250-65 MG per tablet  Take 2 tablets by mouth daily as needed (for headache).      . clonazePAM (KLONOPIN) 1 MG tablet Take 1 mg by mouth 3 (three) times daily as needed for anxiety.       . cyclobenzaprine (FLEXERIL) 10 MG tablet Take 10 mg by mouth 3 (three) times daily as needed for muscle spasms.       Marland Kitchen PARoxetine (PAXIL) 20 MG tablet Take 20 mg by mouth every morning.      . pravastatin (PRAVACHOL) 40 MG tablet Take 40 mg by mouth every morning.      Marland Kitchen dexlansoprazole (DEXILANT) 60 MG capsule Take 1 capsule (60 mg total) by mouth daily.  14 capsule  3  . hyoscyamine (LEVSIN SL) 0.125 MG SL tablet Place 1 tablet (0.125 mg total) under the tongue every 4 (four) hours as needed for cramping.  30 tablet  0  . peg 3350 powder (MOVIPREP) 100 G SOLR Take 1 kit (200 g total) by mouth as directed.  1 kit  0   No current facility-administered medications for this visit.    Allergies as of 03/05/2013 - Review Complete 03/05/2013  Allergen Reaction Noted  . Penicillins  07/24/2011    Family History  Problem Relation Age of Onset  . Colon cancer Brother     mid 66s, deceased  . Colon cancer Father     diagnosed at 32, deceased    History   Social History  . Marital Status: Legally Separated    Spouse Name: N/A    Number of Children: N/A  . Years  of Education: N/A   Occupational History  . [employer]     3rd shift   Social History Main Topics  . Smoking status: Former Games developer  . Smokeless tobacco: Not on file  . Alcohol Use: No  . Drug Use: No  . Sexual Activity: Not on file   Other Topics Concern  . Not on file   Social History Narrative  . No narrative on file    Review of Systems: Gen: Denies any fever, chills, fatigue, weight loss, lack of appetite.  CV: Denies chest pain, heart palpitations, peripheral edema, syncope.  Resp: Denies shortness of breath at rest or with exertion. Denies wheezing or cough.  GI: Denies dysphagia or odynophagia. Denies jaundice, hematemesis, fecal  incontinence. GU : Denies urinary burning, urinary frequency, urinary hesitancy MS: Denies joint pain, muscle weakness, cramps, or limitation of movement.  Derm: Denies rash, itching, dry skin Psych: Denies depression, anxiety, memory loss, and confusion Heme: Denies bruising, bleeding, and enlarged lymph nodes.  Physical Exam: BP 113/73  Pulse 85  Temp(Src) 98.5 F (36.9 C) (Oral)  Ht 5\' 2"  (1.575 m)  Wt 153 lb 9.6 oz (69.673 kg)  BMI 28.09 kg/m2 General:   Alert and oriented. Pleasant and cooperative. Well-nourished and well-developed.  Head:  Normocephalic and atraumatic. Eyes:  Without icterus, sclera clear and conjunctiva pink.  Ears:  Normal auditory acuity. Nose:  No deformity, discharge,  or lesions. Mouth:  No deformity or lesions, oral mucosa pink.  Neck:  Supple, without mass or thyromegaly. Lungs:  Clear to auscultation bilaterally. No wheezes, rales, or rhonchi. No distress.  Heart:  S1, S2 present without murmurs appreciated.  Abdomen:  +BS, soft, non-tender and non-distended. No HSM noted. No guarding or rebound. No masses appreciated.  Rectal:  Deferred  Msk:  Symmetrical without gross deformities. Normal posture. Pulses:  Normal pulses noted. Extremities:  Without clubbing or edema. Neurologic:  Alert and  oriented x4;  grossly normal neurologically. Skin:  Intact without significant lesions or rashes. Cervical Nodes:  No significant cervical adenopathy. Psych:  Alert and cooperative. Normal mood and affect.

## 2013-03-06 ENCOUNTER — Ambulatory Visit: Payer: BC Managed Care – PPO | Admitting: Gastroenterology

## 2013-03-06 ENCOUNTER — Encounter: Payer: Self-pay | Admitting: Gastroenterology

## 2013-03-07 DIAGNOSIS — R1310 Dysphagia, unspecified: Secondary | ICD-10-CM | POA: Insufficient documentation

## 2013-03-07 DIAGNOSIS — R194 Change in bowel habit: Secondary | ICD-10-CM | POA: Insufficient documentation

## 2013-03-07 NOTE — Assessment & Plan Note (Signed)
Recurrent dysphagia to solid food and pills. Intermittent GERD without a PPI. Lack of insurance impeding regular dosing of PPI. 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   Will proceed with EGD/ED with Dr. Darrick Penna in the near future. Risks and benefits discussed in detail with stated understanding. Dexilant daily. Patient assistance forms provided. BPE if EGD negative.

## 2013-03-07 NOTE — Progress Notes (Signed)
Primary Care Physician:  Isabella Stalling, MD Primary Gastroenterologist:  Dr. Darrick Penna   Chief Complaint  Patient presents with  . Dysphagia    HPI:   52 year old female presents today due to concerns of worsening dysphagia. Last EGD in 2010 by Dr. Darrick Penna with normal esophagus, empiric dilation with 16mm Savary dilator due to possibility of proximal cervical web. Dysphagia progressing. Solid food dysphagia. "gets stuck" cervical area. Pill dysphagia. Has to drink behind it. Tries to eat soft foods so it will slide on down. Abdominal cramping, staying on commode "too much". Dealing with more constipation than diarrhea. Starts cramping, "getting ready", usually 5-30 minutes after eating something will finally trigger her. Has to be near a bathroom or she is incontinent. Denies loose stool. Hemorrhoid stays out. "push up all the time". Gets real hot on the commode. Almost feels like labor. Sometimes will feel so nauseated she will throw up. BM usually once to every other day. Legs will get numb when on toilet. Hollers out.   Intermittent nausea, unrelated to bowel habits. Epigastric "funny feelings", discomfort. Not associated with eating/drinking. No melena. Takes Tums all the time.   Has taken Omeprazole in past. No insurance for medication.   Past Medical History  Diagnosis Date  . Irritable bowel syndrome   . Horseshoe kidney   . DDD (degenerative disc disease)     spinal injections  . Hypercholesterolemia     Past Surgical History  Procedure Laterality Date  . Tonsillectomy    . Tubal ligation    . Wrist surgery    . Colonoscopy  07/01/2009    [id number] seen in the ascending and sigmoid colon/small internal hemorrhoids/6-mm sessile polyp/4-mm sessile polyp.3-mm sessile polyp removed. simple adenomas  . 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 Cervical dysplasia      Current Outpatient  Prescriptions  Medication Sig Dispense Refill  . acetaminophen (TYLENOL) 650 MG CR tablet Take 1,300 mg by mouth daily as needed for pain.      Marland Kitchen aspirin-acetaminophen-caffeine (EXCEDRIN MIGRAINE) 250-250-65 MG per tablet Take 2 tablets by mouth daily as needed (for headache).      . clonazePAM (KLONOPIN) 1 MG tablet Take 1 mg by mouth 3 (three) times daily as needed for anxiety.       . cyclobenzaprine (FLEXERIL) 10 MG tablet Take 10 mg by mouth 3 (three) times daily as needed for muscle spasms.       Marland Kitchen PARoxetine (PAXIL) 20 MG tablet Take 20 mg by mouth every morning.      . pravastatin (PRAVACHOL) 40 MG tablet Take 40 mg by mouth every morning.      Marland Kitchen dexlansoprazole (DEXILANT) 60 MG capsule Take 1 capsule (60 mg total) by mouth daily.  14 capsule  3  . hyoscyamine (LEVSIN SL) 0.125 MG SL tablet Place 1 tablet (0.125 mg total) under the tongue every 4 (four) hours as needed for cramping.  30 tablet  0  . peg 3350 powder (MOVIPREP) 100 G SOLR Take 1 kit (200 g total) by mouth as directed.  1 kit  0   No current facility-administered medications for this visit.    Allergies as of 03/05/2013 - Review Complete 03/05/2013  Allergen Reaction Noted  . Penicillins  07/24/2011    Family History  Problem Relation Age of Onset  . Colon cancer Brother     mid 31s, deceased  . Colon cancer Father  diagnosed at 23, deceased    History   Social History  . Marital Status: Legally Separated    Spouse Name: N/A    Number of Children: N/A  . Years of Education: N/A   Occupational History  . [employer]     3rd shift   Social History Main Topics  . Smoking status: Former Games developer  . Smokeless tobacco: Not on file  . Alcohol Use: No  . Drug Use: No  . Sexual Activity: Not on file   Other Topics Concern  . Not on file   Social History Narrative  . No narrative on file    Review of Systems: Negative unless mentioned in HPI.    Physical Exam: BP 113/73  Pulse 85  Temp(Src)  98.5 F (36.9 C) (Oral)  Ht 5\' 2"  (1.575 m)  Wt 153 lb 9.6 oz (69.673 kg)  BMI 28.09 kg/m2 General:   Alert and oriented. Pleasant and cooperative. Well-nourished and well-developed.  Head:  Normocephalic and atraumatic. Eyes:  Without icterus, sclera clear and conjunctiva pink.  Ears:  Normal auditory acuity. Nose:  No deformity, discharge,  or lesions. Mouth:  No deformity or lesions, oral mucosa pink.  Neck:  Supple, without mass or thyromegaly. Lungs:  Clear to auscultation bilaterally. No wheezes, rales, or rhonchi. No distress.  Heart:  S1, S2 present without murmurs appreciated.  Abdomen:  +BS, soft, non-tender and non-distended. No HSM noted. No guarding or rebound. No masses appreciated.  Rectal:  Deferred  Msk:  Symmetrical without gross deformities. Normal posture. Extremities:  Without clubbing or edema. Neurologic:  Alert and  oriented x4;  grossly normal neurologically. Skin:  Intact without significant lesions or rashes. Cervical Nodes:  No significant cervical adenopathy. Psych:  Alert and cooperative. Normal mood and affect.

## 2013-03-07 NOTE — Assessment & Plan Note (Signed)
Likely IBS. Last TCS in 2010 with simple adenomas. Abdominal pain noted after BMs. May benefit from Linzess or Amitiza. However, she now tells me both her father and brother succumbed to colon cancer at fairly young ages (45s and 4s respectively). Due to bowel habit changes, worsening symptoms, proceed with TCS at time of EGD. Risks and benefits discussion in detail with stated understanding. High fiber diet provided.

## 2013-03-11 NOTE — Progress Notes (Signed)
CC'd to PCP 

## 2013-03-13 ENCOUNTER — Other Ambulatory Visit: Payer: Self-pay | Admitting: Gastroenterology

## 2013-03-13 ENCOUNTER — Telehealth: Payer: Self-pay | Admitting: Gastroenterology

## 2013-03-13 MED ORDER — PEG 3350-KCL-NA BICARB-NACL 420 G PO SOLR: 4000.0000 mL | ORAL | Status: DC

## 2013-03-13 NOTE — Telephone Encounter (Signed)
Patient called an stated that she can not afford the MoviePrep and could we please send in something cheaper, I sent Trilyte and changed her instructions and mailed them to her

## 2013-03-14 ENCOUNTER — Encounter (HOSPITAL_COMMUNITY): Payer: Self-pay | Admitting: Pharmacy Technician

## 2013-03-25 ENCOUNTER — Encounter (HOSPITAL_COMMUNITY): Payer: Self-pay | Admitting: *Deleted

## 2013-03-25 ENCOUNTER — Ambulatory Visit (HOSPITAL_COMMUNITY)
Admission: RE | Admit: 2013-03-25 | Discharge: 2013-03-25 | Disposition: A | Discharge status: Home Health | Payer: BC Managed Care – PPO | Source: Ambulatory Visit | Attending: Gastroenterology | Admitting: Gastroenterology

## 2013-03-25 ENCOUNTER — Other Ambulatory Visit: Payer: Self-pay | Admitting: Gastroenterology

## 2013-03-25 ENCOUNTER — Telehealth: Payer: Self-pay | Admitting: Gastroenterology

## 2013-03-25 ENCOUNTER — Encounter (HOSPITAL_COMMUNITY)
Admission: RE | Disposition: A | Discharge status: Home Health | Payer: Self-pay | Source: Ambulatory Visit | Attending: Gastroenterology

## 2013-03-25 DIAGNOSIS — D126 Benign neoplasm of colon, unspecified: Secondary | ICD-10-CM | POA: Insufficient documentation

## 2013-03-25 DIAGNOSIS — K5710 Diverticulosis of small intestine without perforation or abscess without bleeding: Secondary | ICD-10-CM

## 2013-03-25 DIAGNOSIS — K5730 Diverticulosis of large intestine without perforation or abscess without bleeding: Secondary | ICD-10-CM

## 2013-03-25 DIAGNOSIS — Z800 Family history of malignant neoplasm of digestive organs: Secondary | ICD-10-CM | POA: Insufficient documentation

## 2013-03-25 DIAGNOSIS — K648 Other hemorrhoids: Secondary | ICD-10-CM | POA: Insufficient documentation

## 2013-03-25 DIAGNOSIS — K222 Esophageal obstruction: Secondary | ICD-10-CM | POA: Insufficient documentation

## 2013-03-25 DIAGNOSIS — IMO0002 Reserved for concepts with insufficient information to code with codable children: Secondary | ICD-10-CM

## 2013-03-25 DIAGNOSIS — K2960 Other gastritis without bleeding: Secondary | ICD-10-CM

## 2013-03-25 DIAGNOSIS — R1310 Dysphagia, unspecified: Secondary | ICD-10-CM | POA: Insufficient documentation

## 2013-03-25 DIAGNOSIS — R198 Other specified symptoms and signs involving the digestive system and abdomen: Secondary | ICD-10-CM | POA: Insufficient documentation

## 2013-03-25 DIAGNOSIS — N816 Rectocele: Secondary | ICD-10-CM

## 2013-03-25 DIAGNOSIS — K2940 Chronic atrophic gastritis without bleeding: Secondary | ICD-10-CM | POA: Insufficient documentation

## 2013-03-25 DIAGNOSIS — R194 Change in bowel habit: Secondary | ICD-10-CM

## 2013-03-25 DIAGNOSIS — R109 Unspecified abdominal pain: Secondary | ICD-10-CM

## 2013-03-25 HISTORY — PX: COLONOSCOPY: SHX5424

## 2013-03-25 HISTORY — PX: ESOPHAGOGASTRODUODENOSCOPY (EGD) WITH ESOPHAGEAL DILATION: SHX5812

## 2013-03-25 SURGERY — COLONOSCOPY
Anesthesia: Moderate Sedation

## 2013-03-25 MED ORDER — PROMETHAZINE HCL 25 MG/ML IJ SOLN
INTRAMUSCULAR | Status: AC
  Filled 2013-03-25: qty 1

## 2013-03-25 MED ORDER — PROMETHAZINE HCL 25 MG/ML IJ SOLN
INTRAMUSCULAR | Status: DC | PRN
  Administered 2013-03-25: 12.5 mg via INTRAVENOUS

## 2013-03-25 MED ORDER — SODIUM CHLORIDE 0.9 % IV SOLN
INTRAVENOUS | Status: DC
  Administered 2013-03-25: 09:00:00 via INTRAVENOUS

## 2013-03-25 MED ORDER — MINERAL OIL PO OIL
TOPICAL_OIL | ORAL | Status: AC
  Filled 2013-03-25: qty 30

## 2013-03-25 MED ORDER — MIDAZOLAM HCL 5 MG/5ML IJ SOLN
INTRAMUSCULAR | Status: DC | PRN
  Administered 2013-03-25 (×2): 1 mg via INTRAVENOUS
  Administered 2013-03-25: 2 mg via INTRAVENOUS
  Administered 2013-03-25: 1 mg via INTRAVENOUS
  Administered 2013-03-25: 2 mg via INTRAVENOUS

## 2013-03-25 MED ORDER — MEPERIDINE HCL 100 MG/ML IJ SOLN
INTRAMUSCULAR | Status: DC | PRN
  Administered 2013-03-25 (×2): 50 mg via INTRAVENOUS
  Administered 2013-03-25: 25 mg via INTRAVENOUS

## 2013-03-25 MED ORDER — BUTAMBEN-TETRACAINE-BENZOCAINE 2-2-14 % EX AERO
INHALATION_SPRAY | CUTANEOUS | Status: DC | PRN
  Administered 2013-03-25: 2 via TOPICAL

## 2013-03-25 MED ORDER — MEPERIDINE HCL 100 MG/ML IJ SOLN
INTRAMUSCULAR | Status: AC
  Filled 2013-03-25: qty 2

## 2013-03-25 MED ORDER — STERILE WATER FOR IRRIGATION IR SOLN
Status: DC | PRN
  Administered 2013-03-25: 10:00:00

## 2013-03-25 MED ORDER — MIDAZOLAM HCL 5 MG/5ML IJ SOLN
INTRAMUSCULAR | Status: AC
  Filled 2013-03-25: qty 10

## 2013-03-25 NOTE — Telephone Encounter (Signed)
Referral has been made.

## 2013-03-25 NOTE — Telephone Encounter (Signed)
Message copied by Glendora Score on Tue Mar 25, 2013  4:11 PM ------      Message from: West Bali      Created: Tue Mar 25, 2013  3:40 PM       SEE A dr. Emelda [facility] FOR EVALUATION FOR A RECTOCELE/CYSTOCELE. ------

## 2013-03-25 NOTE — Op Note (Signed)
[facility] [address] [place], [address]   COLONOSCOPY PROCEDURE REPORT  PATIENT: [name], [name]  MR#: [id number] BIRTHDATE: 1961-06-10 , 52  yrs. old GENDER: Female ENDOSCOPIST: Jonette Eva, MD REFERRED [id number], Loreli Dollar PROCEDURE DATE:  03/25/2013 PROCEDURE:   Colonoscopy with snare polypectomy and Colonoscopy with cold biopsy polypectomy INDICATIONS:Change in bowel habits and Patient's immediate family history of colon cancer.  SOFT BMs BUT HARD TO PASS STOOL & STOOL ARE SMALL CALIBER. ONE VAGINA DELIVERY WITH TEAR. MEDICATIONS: Demerol 100 mg IV, Versed 5 mg IV, and Promethazine (Phenergan) 12.5mg  IV  DESCRIPTION OF PROCEDURE:    Physical exam was performed.  Informed consent was obtained from the patient after explaining the benefits, risks, and alternatives to procedure.  The patient was connected to monitor and placed in left lateral position. Continuous oxygen was provided by nasal cannula and IV medicine administered through an indwelling cannula.  After administration of sedation and rectal exam, the patients rectum was intubated and the EC-[id number] ([id number])  colonoscope was advanced under direct visualization to the ileum.  The scope was removed slowly by carefully examining the color, texture, anatomy, and integrity mucosa on the way out.  The patient was recovered in endoscopy and discharged home in satisfactory condition.    COLON FINDINGS: Two sessile polyps measuring 2-4 mm in size were found in the distal transverse colon.  A polypectomy was performed with a cold snare.  , Two sessile polyps measuring 2-4 mm in size were found in the descending colon.  A polypectomy was performed with cold forceps.  , A sessile polyp measuring 6-7 mm in size was found in the sigmoid colon.  A polypectomy was performed using snare cautery.  , Diverticulosis was noted in the transverse colon, descending colon, and sigmoid colon.  ,  Moderate sized internal hemorrhoids were found.  , and The mucosa appeared normal in the terminal ileum.  PREP QUALITY: good.   CECAL W/D TIME: 12 minutes     COMPLICATIONS: None  ENDOSCOPIC IMPRESSION: 1.   FIVE COLONPOLYPS REMOVED 2.   MODERATE Diverticulosis in the transverse colon, descending colon, and sigmoid colon 3.   Moderate sized internal hemorrhoids  RECOMMENDATIONS: ONLY USE LEVSIN FOR DIARRHEA.  IT CAN CAUSE CONSTIPATION. TAKE A PROBIOTIC DAILY FOR ONE MONTH. IF IT'S NOT MAKING A DIFFERENCE, PT CAN STOP TAKING IT AFTER ONE MONTH. CONTINUE DEXILANT. AVOID ASPIRIN, IBUPROFEN, MOTRIN, ALEVE, BC/GOODY POWDERS FOR 2 WEEKS.  USE TYLENOL FOR PAIN. FOLLOW A HIGH FIBER/LOW FAT DIET.  AVOID ITEMS THAT CAUSE BLOATING.  BIOPSY RESULTS SHOULD BE BACK IN 7 DAYS. SEE A GYN FOR EVALUATION FOR A RECTOCELE/CYSTOCELE. FOLLOW UP IN 3 MOS. Next colonoscopy in 5 years WITH PHENERGAN IN PREOP.       _______________________________ eSignedJonette Eva, MD 03/25/2013 3:31 PM     PATIENT NAME:  [name], [name] MR#: [id number]

## 2013-03-25 NOTE — H&P (Signed)
Primary Care Physician:  Isabella Stalling, MD Primary Gastroenterologist:  Dr. Darrick Penna  Pre-Procedure History & Physical: HPI:  [name] is a 52 y.o. female here for  DYSPHAGIA/CHANGE IN BOWEL HABITS.  Past Medical History  Diagnosis Date  . Irritable bowel syndrome   . Horseshoe kidney   . DDD (degenerative disc disease)     spinal injections  . Hypercholesterolemia     Past Surgical History  Procedure Laterality Date  . Tonsillectomy    . Tubal ligation    . Wrist surgery    . Colonoscopy  07/01/2009    [id number] seen in the ascending and sigmoid colon/small internal hemorrhoids/6-mm sessile polyp/4-mm sessile polyp.3-mm sessile polyp removed. simple adenomas  . 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 Cervical dysplasia      Prior to Admission medications   Medication Sig Start Date End Date Taking? Authorizing Provider  acetaminophen (TYLENOL) 500 MG tablet Take 1,000 mg by mouth every 6 (six) hours as needed for pain.   Yes Historical Provider, MD  clonazePAM (KLONOPIN) 1 MG tablet Take 1 mg by mouth 3 (three) times daily as needed for anxiety.    Yes Historical Provider, MD  peg 3350 powder (MOVIPREP) 100 G SOLR Take 1 kit (200 g total) by mouth as directed. 03/05/13  Yes West Bali, MD  polyethylene glycol-electrolytes (TRILYTE) 420 G solution Take 4,000 mLs by mouth as directed. 03/13/13  Yes [place] Bali, MD  cyclobenzaprine (FLEXERIL) 10 MG tablet Take 10 mg by mouth 3 (three) times daily as needed for muscle spasms.     Historical Provider, MD  dexlansoprazole (DEXILANT) 60 MG capsule Take 1 capsule (60 mg total) by mouth daily. 03/05/13   Nira Retort, NP  hydroxypropyl methylcellulose (ISOPTO TEARS) 2.5 % ophthalmic solution Place 1 drop into both eyes daily as needed (Dry Eyes).    Historical Provider, MD  hyoscyamine (LEVSIN SL) 0.125 MG SL tablet Place 1 tablet (0.125 mg  total) under the tongue every 4 (four) hours as needed for cramping. 03/05/13   Nira Retort, NP  PARoxetine (PAXIL) 20 MG tablet Take 20 mg by mouth every morning.    Historical Provider, MD  pravastatin (PRAVACHOL) 40 MG tablet Take 40 mg by mouth every morning.    Historical Provider, MD    Allergies as of 03/05/2013 - Review Complete 03/05/2013  Allergen Reaction Noted  . Penicillins  07/24/2011    Family History  Problem Relation Age of Onset  . Colon cancer Brother     mid 45s, deceased  . Colon cancer Father     diagnosed at 40, deceased    History   Social History  . Marital Status: Legally Separated    Spouse Name: N/A    Number of Children: N/A  . Years of Education: N/A   Occupational History  . [employer]     3rd shift   Social History Main Topics  . Smoking status: Former Games developer  . Smokeless tobacco: Not on file  . Alcohol Use: No  . Drug Use: No  . Sexual Activity: Not on file   Other Topics Concern  . Not on file   Social History Narrative  . No narrative on file    Review of Systems: See HPI, otherwise negative ROS   Physical Exam: BP 118/79  Pulse 85  Temp(Src) 98.5 F (36.9 C) (Oral)  Resp 19  Ht 5\' 2"  (  1.575 m)  Wt 153 lb (69.4 kg)  BMI 27.98 kg/m2  SpO2 95% General:   Alert,  pleasant and cooperative in NAD Head:  Normocephalic and atraumatic. Neck:  Supple; Lungs:  Clear throughout to auscultation.    Heart:  Regular rate and rhythm. Abdomen:  Soft, nontender and nondistended. Normal bowel sounds, without guarding, and without rebound.   Neurologic:  Alert and  oriented x4;  grossly normal neurologically.  Impression/Plan:     DYSPHAGIA/CHANGE IN BOWEL HABITS.   PLAN:  EGD/DIL/tcs TODAY

## 2013-03-25 NOTE — Op Note (Signed)
[facility] [address] [place], [address]   ENDOSCOPY PROCEDURE REPORT  PATIENT: Raven, Lane  MR#: [id number] BIRTHDATE: [birth year] , 52  yrs. old GENDER: Female  ENDOSCOPIST: Jonette Eva, MD REFFERED [id number] Janna Arch, M.D.  Christin Bach, M.D.  PROCEDURE DATE:  03/25/2013 PROCEDURE:   EGD with biopsy and EGD with dilatation over guidewire   INDICATIONS:1.  dyspepsia.   2.  dyspepsia. MEDICATIONS: TCS+ Demerol 25 mg IV and Versed 2 mg IV TOPICAL ANESTHETIC: Cetacaine Spray  DESCRIPTION OF PROCEDURE:   After the risks benefits and alternatives of the procedure were thoroughly explained, informed consent was obtained.  The [id number] ([id number])  endoscope was introduced through the mouth and advanced to the second portion of the duodenum. The instrument was slowly withdrawn as the mucosa was carefully examined.  Prior to withdrawal of the scope, the guidwire was placed.  The esophagus was dilated successfully.  The patient was recovered in endoscopy and discharged home in satisfactory condition.   ESOPHAGUS: A Schatzki ring was found at the gastroesophageal junction.   The esophagus was otherwise normal.   STOMACH: Moderate erosive gastritis (inflammation) was found in the gastric fundus and gastric antrum.  Multiple biopsies were performed using cold forceps.   DUODENUM: The duodenal mucosa showed no abnormalities in the bulb. duodenal diverticulum in the second portion of the duodenum.   Dilation was then performed at the gastroesphageal junction Dilator: Savary over guidewire Size(s): 15-16 MM Resistance: minimal Heme: yes  COMPLICATIONS: There were no complications.  ENDOSCOPIC IMPRESSION: 1.   Schatzki ring at the gastroesophageal junction 2.   MODERATE Erosive gastritis 3.  SINGLE DUODENAL DIVERTICULUM 4.  ABDOMINAL PAIN/CHANGE IN BOWEL HABITS DUE TO GASTRITIS/IBS, less likely SIBO  RECOMMENDATIONS: ONLY USE LEVSIN FOR DIARRHEA.  IT  CAN CAUSE CONSTIPATION. TAKE A PROBIOTIC DAILY FOR ONE MONTH.  IF IT'S NOT MAKING A DIFFERENCE, PT CAN STOP TAKING IT AFTER ONE MONTH. CONTINUE DEXILANT. AVOID ASPIRIN, IBUPROFEN, MOTRIN, ALEVE, BC/GOODY POWDERS FOR 2 WEEKS.  USE TYLENOL FOR PAIN. FOLLOW A HIGH FIBER/LOW FAT DIET.  AVOID ITEMS THAT CAUSE BLOATING.  BIOPSY RESULTS SHOULD BE BACK IN 7 DAYS. SEE A GYN FOR EVALUATION FOR A RECTOCELE/CYSTOCELE. FOLLOW UP IN 3 MOS. CONSIDER HBT FOR SIBO IF SX NOT RESOLVED. Next colonoscopy in 5 years WITH PHENERGAN IN PREOP.      _______________________________ eSignedJonette Eva, MD 03/25/2013 3:39 PM      PATIENT NAME:  Raven, Lane MR#: [id number]

## 2013-03-25 NOTE — Progress Notes (Signed)
REVIEWED.  

## 2013-03-27 ENCOUNTER — Encounter (HOSPITAL_COMMUNITY): Payer: Self-pay | Admitting: Gastroenterology

## 2013-03-29 ENCOUNTER — Telehealth: Payer: Self-pay | Admitting: Gastroenterology

## 2013-03-29 NOTE — Telephone Encounter (Signed)
Please call pt. She had simple adenomas removed from her colon. HER stomach Bx shows gastritis.    ONLY USE LEVSIN FOR DIARRHEA. IT CAN CAUSE CONSTIPATION.  TAKE A PROBIOTIC DAILY FOR ONE MONTH ([employer] BRAND, [employer]ON HE[employer], ALIGN). IF YOU FEEL IT'S NOT MAKING A DIFFERENCE, YOU CAN STOP TAKING IT AFTER ONE MONTH.[name] DEXILANT.   AVOID ASPIRIN, IBUPROFEN, MOTRIN, ALEVE, BC/GOODY POWDERS FOR 2 WEEKS. USE TYLENOL FOR PAIN.  FOLLOW A HIGH FIBER/LOW FAT DIET. AVOID ITEMS THAT CAUSE BLOATING.  SEE A GYN FOR EVALUATION FOR A RECTOCELE OR CYSTOCELE.  FOLLOW UP IN 3 MOS W/ AS E30 DYSPHAGIA/CHANGE IN BOWEL HABITS.  Next colonoscopy in 5 years.

## 2013-03-31 NOTE — Telephone Encounter (Signed)
Tried to call with no answer  

## 2013-03-31 NOTE — Telephone Encounter (Signed)
REMINDER APPT MADE 

## 2013-04-01 NOTE — Telephone Encounter (Signed)
Pt returned call and was informed.  

## 2013-04-01 NOTE — Telephone Encounter (Signed)
LMOM to call.

## 2013-04-10 ENCOUNTER — Encounter: Payer: Self-pay | Admitting: Obstetrics and Gynecology

## 2013-06-19 ENCOUNTER — Encounter: Payer: Self-pay | Admitting: Gastroenterology

## 2013-09-30 NOTE — Progress Notes (Signed)
Results: TCS SEP 2014 SIMPLE ADNEOMA(3), TICS IH  EGD/DIL SEP 2014 GASTRITIS

## 2013-12-13 ENCOUNTER — Encounter (HOSPITAL_COMMUNITY): Payer: Self-pay | Admitting: Emergency Medicine

## 2013-12-13 ENCOUNTER — Emergency Department (HOSPITAL_COMMUNITY)
Admission: EM | Admit: 2013-12-13 | Discharge: 2013-12-13 | Disposition: A | Discharge status: Home / Self Care | Payer: Self-pay | Attending: Emergency Medicine | Admitting: Emergency Medicine

## 2013-12-13 ENCOUNTER — Emergency Department (HOSPITAL_COMMUNITY): Payer: BC Managed Care – PPO

## 2013-12-13 ENCOUNTER — Emergency Department (HOSPITAL_COMMUNITY): Payer: Self-pay

## 2013-12-13 DIAGNOSIS — Z9889 Other specified postprocedural states: Secondary | ICD-10-CM | POA: Insufficient documentation

## 2013-12-13 DIAGNOSIS — Z862 Personal history of diseases of the blood and blood-forming organs and certain disorders involving the immune mechanism: Secondary | ICD-10-CM | POA: Insufficient documentation

## 2013-12-13 DIAGNOSIS — R1013 Epigastric pain: Secondary | ICD-10-CM | POA: Insufficient documentation

## 2013-12-13 DIAGNOSIS — Z8719 Personal history of other diseases of the digestive system: Secondary | ICD-10-CM | POA: Insufficient documentation

## 2013-12-13 DIAGNOSIS — R1012 Left upper quadrant pain: Secondary | ICD-10-CM | POA: Insufficient documentation

## 2013-12-13 DIAGNOSIS — Z791 Long term (current) use of non-steroidal anti-inflammatories (NSAID): Secondary | ICD-10-CM | POA: Insufficient documentation

## 2013-12-13 DIAGNOSIS — Q638 Other specified congenital malformations of kidney: Secondary | ICD-10-CM | POA: Insufficient documentation

## 2013-12-13 DIAGNOSIS — R079 Chest pain, unspecified: Secondary | ICD-10-CM | POA: Insufficient documentation

## 2013-12-13 DIAGNOSIS — Z8639 Personal history of other endocrine, nutritional and metabolic disease: Secondary | ICD-10-CM | POA: Insufficient documentation

## 2013-12-13 DIAGNOSIS — Z79899 Other long term (current) drug therapy: Secondary | ICD-10-CM | POA: Insufficient documentation

## 2013-12-13 DIAGNOSIS — Z87891 Personal history of nicotine dependence: Secondary | ICD-10-CM | POA: Insufficient documentation

## 2013-12-13 DIAGNOSIS — Z8739 Personal history of other diseases of the musculoskeletal system and connective tissue: Secondary | ICD-10-CM | POA: Insufficient documentation

## 2013-12-13 DIAGNOSIS — Z880 Allergy status to penicillin: Secondary | ICD-10-CM | POA: Insufficient documentation

## 2013-12-13 DIAGNOSIS — F329 Major depressive disorder, single episode, unspecified: Secondary | ICD-10-CM | POA: Insufficient documentation

## 2013-12-13 DIAGNOSIS — F3289 Other specified depressive episodes: Secondary | ICD-10-CM | POA: Insufficient documentation

## 2013-12-13 DIAGNOSIS — Z9851 Tubal ligation status: Secondary | ICD-10-CM | POA: Insufficient documentation

## 2013-12-13 HISTORY — DX: Diverticulosis of intestine, part unspecified, without perforation or abscess without bleeding: K57.90

## 2013-12-13 LAB — CBC WITH DIFFERENTIAL/PLATELET
BASOS PCT: 0 % (ref 0–1)
Basophils Absolute: 0 10*3/uL (ref 0.0–0.1)
Eosinophils Absolute: 0.2 10*3/uL (ref 0.0–0.7)
Eosinophils Relative: 2 % (ref 0–5)
HCT: 42.1 % (ref 36.0–46.0)
HEMOGLOBIN: 14.1 g/dL (ref 12.0–15.0)
Lymphocytes Relative: 27 % (ref 12–46)
Lymphs Abs: 1.8 10*3/uL (ref 0.7–4.0)
MCH: 29.6 pg (ref 26.0–34.0)
MCHC: 33.5 g/dL (ref 30.0–36.0)
MCV: 88.3 fL (ref 78.0–100.0)
Monocytes Absolute: 0.4 10*3/uL (ref 0.1–1.0)
Monocytes Relative: 6 % (ref 3–12)
NEUTROS ABS: 4.3 10*3/uL (ref 1.7–7.7)
NEUTROS PCT: 65 % (ref 43–77)
Platelets: 142 10*3/uL — ABNORMAL LOW (ref 150–400)
RBC: 4.77 MIL/uL (ref 3.87–5.11)
RDW: 14.1 % (ref 11.5–15.5)
WBC: 6.7 10*3/uL (ref 4.0–10.5)

## 2013-12-13 LAB — HEPATIC FUNCTION PANEL
ALBUMIN: 4.2 g/dL (ref 3.5–5.2)
ALT: 13 U/L (ref 0–35)
AST: 16 U/L (ref 0–37)
Alkaline Phosphatase: 81 U/L (ref 39–117)
BILIRUBIN TOTAL: 0.4 mg/dL (ref 0.3–1.2)
Bilirubin, Direct: <0.2 mg/dL (ref 0.0–0.3)
TOTAL PROTEIN: 7.3 g/dL (ref 6.0–8.3)

## 2013-12-13 LAB — BASIC METABOLIC PANEL
BUN: 9 mg/dL (ref 6–23)
CHLORIDE: 101 meq/L (ref 96–112)
CO2: 26 mEq/L (ref 19–32)
Calcium: 10.3 mg/dL (ref 8.4–10.5)
Creatinine, Ser: 0.96 mg/dL (ref 0.50–1.10)
GFR calc non Af Amer: 66 mL/min — ABNORMAL LOW (ref >90)
GFR, EST AFRICAN AMERICAN: 77 mL/min — AB (ref >90)
Glucose, Bld: 141 mg/dL — ABNORMAL HIGH (ref 70–99)
POTASSIUM: 3.4 meq/L — AB (ref 3.7–5.3)
Sodium: 140 mEq/L (ref 137–147)

## 2013-12-13 LAB — LIPASE, BLOOD: Lipase: 18 U/L (ref 11–59)

## 2013-12-13 LAB — TROPONIN I: Troponin I: <0.3 ng/mL (ref <0.30)

## 2013-12-13 MED ORDER — FENTANYL CITRATE 0.05 MG/ML IJ SOLN
100.0000 ug | INTRAMUSCULAR | Status: DC | PRN
  Administered 2013-12-13 (×2): 100 ug via INTRAVENOUS
  Filled 2013-12-13 (×2): qty 2

## 2013-12-13 MED ORDER — ONDANSETRON HCL 4 MG/2ML IJ SOLN
4.0000 mg | Freq: Once | INTRAMUSCULAR | Status: AC
  Administered 2013-12-13: 4 mg via INTRAVENOUS
  Filled 2013-12-13: qty 2

## 2013-12-13 MED ORDER — ONDANSETRON HCL 8 MG PO TABS: 8.0000 mg | ORAL_TABLET | Freq: Three times a day (TID) | ORAL | Status: DC | PRN

## 2013-12-13 MED ORDER — HYDROCODONE-ACETAMINOPHEN 5-325 MG PO TABS: 1.0000 | ORAL_TABLET | ORAL | Status: DC | PRN

## 2013-12-13 NOTE — Discharge Instructions (Signed)
Abdominal Pain, Adult °Many things can cause abdominal pain. Usually, abdominal pain is not caused by a disease and will improve without treatment. It can often be observed and treated at home. Your health care provider will do a physical exam and possibly order blood tests and X-rays to help determine the seriousness of your pain. However, in many cases, more time must pass before a clear cause of the pain can be found. Before that point, your health care provider may not know if you need more testing or further treatment. °HOME CARE INSTRUCTIONS  °Monitor your abdominal pain for any changes. The following actions may help to alleviate any discomfort you are experiencing: °· Only take over-the-counter or prescription medicines as directed by your health care provider. °· Do not take laxatives unless directed to do so by your health care provider. °· Try a clear liquid diet (broth, tea, or water) as directed by your health care provider. Slowly move to a bland diet as tolerated. °SEEK MEDICAL CARE IF: °· You have unexplained abdominal pain. °· You have abdominal pain associated with nausea or diarrhea. °· You have pain when you urinate or have a bowel movement. °· You experience abdominal pain that wakes you in the night. °· You have abdominal pain that is worsened or improved by eating food. °· You have abdominal pain that is worsened with eating fatty foods. °SEEK IMMEDIATE MEDICAL CARE IF:  °· Your pain does not go away within 2 hours. °· You have a fever. °· You keep throwing up (vomiting). °· Your pain is felt only in portions of the abdomen, such as the right side or the left lower portion of the abdomen. °· You pass bloody or black tarry stools. °MAKE SURE YOU: °· Understand these instructions.   °· Will watch your condition.   °· Will get help right away if you are not doing well or get worse.   °Document Released: 04/05/2005 Document Revised: 04/16/2013 Document Reviewed: 03/05/2013 °[employer]® Patient  Information ©2014 [employer]. ° °

## 2013-12-13 NOTE — ED Provider Notes (Signed)
CSN: [id number]     Arrival date & time 12/13/13  1439 History   First MD Initiated Contact with Patient 12/13/13 1515     Chief Complaint  Patient presents with  . Chest Pain     (Consider location/radiation/quality/duration/timing/severity/associated sxs/prior Treatment) Patient is a 53 y.o. female presenting with chest pain. The history is provided by the patient.  Chest Pain  She complains of upper abdominal pain, for 2 weeks. The pain is persistent,. The pain waxes and wanes. Several days ago. The pain was more right sided upper abdomen, but is now in the center of her upper abdomen. The pain radiates to the chest bilaterally. She saw her PCP, had labs done, and was recommended to use Naprosyn for her pain. She has not had imaging, yet. She does not have any known gallbladder problems. She's never had abdominal surgery. She has IBS, but is not currently taking medicines, for it. She denies use of alcohol or tobacco products. There been no fever, chills, dysuria, urinary frequency, constipation, or diarrhea. She's had 2 episodes of vomiting times several episodes of dry heaves. She tried an enema to help with a sensation of gas, and gas pills, both without relief. There are no other known modifying factors.  Past Medical History  Diagnosis Date  . Irritable bowel syndrome   . Horseshoe kidney   . DDD (degenerative disc disease)     spinal injections  . Hypercholesterolemia   . Diverticular disease   . DDD (degenerative disc disease), lumbar    Past Surgical History  Procedure Laterality Date  . Tonsillectomy    . Tubal ligation    . Wrist surgery    . Colonoscopy  07/01/2009    DGU:[id number] seen in the ascending and sigmoid colon/small internal hemorrhoids/6-mm sessile polyp/4-mm sessile polyp.3-mm sessile polyp removed. simple adenomas  . Esophagogastroduodenoscopy  07/01/2009    GLO:VFIEPP esophagus/dilation to 16 mm possible proximal cervical web  . Cervical dysplasia    .  Colonoscopy N/A 03/25/2013    Procedure: COLONOSCOPY;  Surgeon: Danie Binder, MD;  [facility]: [facility];  Service: Endoscopy;  Laterality: N/A;  10:15  . Esophagogastroduodenoscopy (egd) with esophageal dilation N/A 03/25/2013    Procedure: ESOPHAGOGASTRODUODENOSCOPY (EGD) WITH ESOPHAGEAL DILATION;  Surgeon: Danie Binder, MD;  Location: [facility];  Service: Endoscopy;  Laterality: N/A;   Family History  Problem Relation Age of Onset  . Colon cancer Brother     mid 24s, deceased  . Colon cancer Father     diagnosed at 38, deceased   History  Substance Use Topics  . Smoking status: Former Research scientist (life sciences)  . Smokeless tobacco: Not on file  . Alcohol Use: No   OB History   Grav Para Term Preterm Abortions TAB SAB Ect Mult Living                 Review of Systems  Cardiovascular: Positive for chest pain.  All other systems reviewed and are negative.     Allergies  Penicillins  Home Medications   Prior to Admission medications   Medication Sig Start Date End Date Taking? Authorizing Provider  acetaminophen (TYLENOL) 500 MG tablet Take 1,000 mg by mouth every 6 (six) hours as needed for pain.   Yes Historical Provider, MD  Iron-FA-B Cmp-C-Biot-Probiotic (FUSION PLUS PO) Take 1 tablet by mouth daily.   Yes Historical Provider, MD  naproxen (NAPROSYN) 500 MG tablet Take 500 mg by mouth 2 (two) times daily.   Yes Historical  Provider, MD  Simethicone (GAS-X EXTRA STRENGTH PO) Take 2 tablets by mouth daily as needed. Gas/stomach discomfort   Yes Historical Provider, MD   BP 141/107  Pulse 102  Temp(Src) 98.2 F (36.8 C) (Oral)  Resp 20  Ht 5\' 2"  (1.575 m)  Wt 142 lb (64.411 kg)  BMI 25.97 kg/m2  SpO2 100% Physical Exam  Nursing note and vitals reviewed. Constitutional: She is oriented to person, place, and time. She appears well-developed and well-nourished. She appears distressed (she is tearful).  HENT:  Head: Normocephalic and atraumatic.  Eyes: Conjunctivae and EOM are  normal. Pupils are equal, round, and reactive to light.  Neck: Normal range of motion and phonation normal. Neck supple.  Cardiovascular: Normal rate, regular rhythm and intact distal pulses.   Pulmonary/Chest: Effort normal and breath sounds normal. She exhibits no tenderness.  Abdominal: Soft. She exhibits no distension and no mass. There is tenderness (Epigastric, moderate. There is also pain in the right upper quadrant, and left upper quadrant, that is mild). There is no rebound and no guarding.  Musculoskeletal: Normal range of motion.  Neurological: She is alert and oriented to person, place, and time. She exhibits normal muscle tone.  Skin: Skin is warm and dry.  Psychiatric: Her behavior is normal. Judgment and thought content normal.  Depressed and nervous Affect.    ED Course  Procedures (including critical care time)  Medications  fentaNYL (SUBLIMAZE) injection 100 mcg (100 mcg Intravenous Given 12/13/13 1543)  ondansetron (ZOFRAN) injection 4 mg (4 mg Intravenous Given 12/13/13 1543)    Patient Vitals for the past 24 hrs:  BP Temp Temp src Pulse Resp SpO2 Height Weight  12/13/13 1448 141/107 mmHg 98.2 F (36.8 C) Oral 102 20 100 % 5\' 2"  (1.575 m) 142 lb (64.411 kg)    7:25 PM Reevaluation with update and discussion. After initial assessment and treatment, an updated evaluation reveals She is comfortable now. Findings discussed with pt, all questions answered.Richarda Blade   Labs Review Labs Reviewed  CBC WITH DIFFERENTIAL - Abnormal; Notable for the following:    Platelets 142 (*)    All other components within normal limits  BASIC METABOLIC PANEL - Abnormal; Notable for the following:    Potassium 3.4 (*)    Glucose, Bld 141 (*)    GFR calc non Af Amer 66 (*)    GFR calc Af Amer 77 (*)    All other components within normal limits  TROPONIN I  HEPATIC FUNCTION PANEL  LIPASE, BLOOD    Imaging Review Dg Chest Portable 1 View  12/13/2013   CLINICAL DATA:  Chest  pain.  Shortness of breath.  Hypertension.  EXAM: PORTABLE CHEST - 1 VIEW  COMPARISON:  None.  FINDINGS: The heart size and mediastinal contours are within normal limits. Both lungs are clear. The visualized skeletal structures are unremarkable.  IMPRESSION: No acute findings.   Electronically Signed   By: Earle Gell M.D.   On: 12/13/2013 15:10     EKG Interpretation   Date/Time:  Saturday December 13 2013 14:48:05 EDT Ventricular Rate:  93 PR Interval:  160 QRS Duration: 82 QT Interval:  363 QTC Calculation: 451 R Axis:   37 Text Interpretation:  Sinus rhythm since last tracing no significant  change Confirmed by Rio Kidane  MD, Karn Derk ([id number]) on 12/13/2013 3:16:02 PM      MDM   Final diagnoses:  Abdominal pain, epigastric    Nonspecific upper abdominal pain, without evidence for acute  gallbladder problems pancreatic problems or change in her chronic kidney status. Doubt ACS, PE, pneumonia or metabolic instability. Possible esophageal problems, including esophageal reflux. She has previously had an esophageal dilatation. She does not currently have dysphasia.  Nursing Notes Reviewed/ Care Coordinated Applicable Imaging Reviewed Interpretation of Laboratory Data incorporated into ED treatment  The patient appears reasonably screened and/or stabilized for discharge and I doubt any other medical condition or other [facility] requiring further screening, evaluation, or treatment in the ED at this time prior to discharge.  Plan: Home Medications- Norco, Zofran, Stop Naprosyn; Home Treatments- rest; return here if the recommended treatment, does not improve the symptoms; Recommended follow up- GI f/u asap     Richarda Blade, MD 12/13/13 1942

## 2013-12-13 NOTE — ED Notes (Signed)
"  gas pains" x 2 wks ago with belching and nausea.  Taking OTC tums and gas ex with no relief.  Seen PCP last week.  Now c/o epigastric pain that started today, worsening, causing n/v, sob and pain radiates to back.  Pt anxious and tearful upon assessment.

## 2014-05-18 ENCOUNTER — Encounter: Payer: Self-pay | Admitting: Gastroenterology

## 2014-09-21 DIAGNOSIS — R197 Diarrhea, unspecified: Secondary | ICD-10-CM | POA: Insufficient documentation

## 2014-09-21 DIAGNOSIS — Z8739 Personal history of other diseases of the musculoskeletal system and connective tissue: Secondary | ICD-10-CM | POA: Insufficient documentation

## 2014-09-21 DIAGNOSIS — Z79899 Other long term (current) drug therapy: Secondary | ICD-10-CM | POA: Insufficient documentation

## 2014-09-21 DIAGNOSIS — Z8639 Personal history of other endocrine, nutritional and metabolic disease: Secondary | ICD-10-CM | POA: Insufficient documentation

## 2014-09-21 DIAGNOSIS — R110 Nausea: Secondary | ICD-10-CM | POA: Insufficient documentation

## 2014-09-21 DIAGNOSIS — R1013 Epigastric pain: Secondary | ICD-10-CM | POA: Insufficient documentation

## 2014-09-21 DIAGNOSIS — Q631 Lobulated, fused and horseshoe kidney: Secondary | ICD-10-CM | POA: Insufficient documentation

## 2014-09-21 DIAGNOSIS — Z9851 Tubal ligation status: Secondary | ICD-10-CM | POA: Insufficient documentation

## 2014-09-21 DIAGNOSIS — Z9889 Other specified postprocedural states: Secondary | ICD-10-CM | POA: Insufficient documentation

## 2014-09-21 DIAGNOSIS — R1011 Right upper quadrant pain: Secondary | ICD-10-CM | POA: Insufficient documentation

## 2014-09-21 DIAGNOSIS — Z880 Allergy status to penicillin: Secondary | ICD-10-CM | POA: Insufficient documentation

## 2014-09-21 DIAGNOSIS — K5900 Constipation, unspecified: Secondary | ICD-10-CM | POA: Insufficient documentation

## 2014-09-22 ENCOUNTER — Encounter (HOSPITAL_COMMUNITY): Payer: Self-pay | Admitting: Emergency Medicine

## 2014-09-22 ENCOUNTER — Emergency Department (HOSPITAL_COMMUNITY): Payer: Self-pay

## 2014-09-22 ENCOUNTER — Emergency Department (HOSPITAL_COMMUNITY)
Admission: EM | Admit: 2014-09-22 | Discharge: 2014-09-22 | Disposition: A | Discharge status: Home / Self Care | Payer: Self-pay | Attending: Emergency Medicine | Admitting: Emergency Medicine

## 2014-09-22 DIAGNOSIS — R109 Unspecified abdominal pain: Secondary | ICD-10-CM

## 2014-09-22 DIAGNOSIS — R197 Diarrhea, unspecified: Secondary | ICD-10-CM

## 2014-09-22 DIAGNOSIS — R1013 Epigastric pain: Secondary | ICD-10-CM

## 2014-09-22 LAB — COMPREHENSIVE METABOLIC PANEL
ALBUMIN: 4.2 g/dL (ref 3.5–5.2)
ALK PHOS: 82 U/L (ref 39–117)
ALT: 13 U/L (ref 0–35)
ANION GAP: 9 (ref 5–15)
AST: 18 U/L (ref 0–37)
BUN: 6 mg/dL (ref 6–23)
CHLORIDE: 108 mmol/L (ref 96–112)
CO2: 23 mmol/L (ref 19–32)
Calcium: 10.4 mg/dL (ref 8.4–10.5)
Creatinine, Ser: 0.71 mg/dL (ref 0.50–1.10)
GFR calc Af Amer: >90 mL/min (ref >90)
GFR calc non Af Amer: >90 mL/min (ref >90)
Glucose, Bld: 111 mg/dL — ABNORMAL HIGH (ref 70–99)
POTASSIUM: 3.9 mmol/L (ref 3.5–5.1)
SODIUM: 140 mmol/L (ref 135–145)
Total Bilirubin: 0.4 mg/dL (ref 0.3–1.2)
Total Protein: 7.2 g/dL (ref 6.0–8.3)

## 2014-09-22 LAB — URINALYSIS, ROUTINE W REFLEX MICROSCOPIC
BILIRUBIN URINE: NEGATIVE
Glucose, UA: NEGATIVE mg/dL
HGB URINE DIPSTICK: NEGATIVE
KETONES UR: NEGATIVE mg/dL
Leukocytes, UA: NEGATIVE
Nitrite: NEGATIVE
PROTEIN: NEGATIVE mg/dL
SPECIFIC GRAVITY, URINE: 1.015 (ref 1.005–1.030)
Urobilinogen, UA: 0.2 mg/dL (ref 0.0–1.0)
pH: 6.5 (ref 5.0–8.0)

## 2014-09-22 LAB — CBC WITH DIFFERENTIAL/PLATELET
Basophils Absolute: 0 10*3/uL (ref 0.0–0.1)
Basophils Relative: 0 % (ref 0–1)
EOS PCT: 3 % (ref 0–5)
Eosinophils Absolute: 0.2 10*3/uL (ref 0.0–0.7)
HEMATOCRIT: 42.1 % (ref 36.0–46.0)
Hemoglobin: 14.2 g/dL (ref 12.0–15.0)
LYMPHS ABS: 1.8 10*3/uL (ref 0.7–4.0)
Lymphocytes Relative: 23 % (ref 12–46)
MCH: 30.1 pg (ref 26.0–34.0)
MCHC: 33.7 g/dL (ref 30.0–36.0)
MCV: 89.2 fL (ref 78.0–100.0)
MONO ABS: 0.5 10*3/uL (ref 0.1–1.0)
Monocytes Relative: 7 % (ref 3–12)
NEUTROS ABS: 5.1 10*3/uL (ref 1.7–7.7)
NEUTROS PCT: 67 % (ref 43–77)
PLATELETS: 151 10*3/uL (ref 150–400)
RBC: 4.72 MIL/uL (ref 3.87–5.11)
RDW: 13.9 % (ref 11.5–15.5)
WBC: 7.6 10*3/uL (ref 4.0–10.5)

## 2014-09-22 LAB — TROPONIN I: Troponin I: <0.03 ng/mL (ref <0.031)

## 2014-09-22 LAB — LIPASE, BLOOD: LIPASE: 18 U/L (ref 11–59)

## 2014-09-22 MED ORDER — MORPHINE SULFATE 4 MG/ML IJ SOLN
4.0000 mg | Freq: Once | INTRAMUSCULAR | Status: AC
  Administered 2014-09-22: 4 mg via INTRAVENOUS
  Filled 2014-09-22: qty 1

## 2014-09-22 MED ORDER — ONDANSETRON HCL 8 MG PO TABS
8.0000 mg | ORAL_TABLET | Freq: Three times a day (TID) | ORAL | Status: DC | PRN
Start: 2014-09-22 — End: 2015-01-25

## 2014-09-22 MED ORDER — IOHEXOL 300 MG/ML  SOLN
100.0000 mL | Freq: Once | INTRAMUSCULAR | Status: AC | PRN
  Administered 2014-09-22: 100 mL via INTRAVENOUS

## 2014-09-22 MED ORDER — GI COCKTAIL ~~LOC~~
30.0000 mL | Freq: Once | ORAL | Status: DC
  Filled 2014-09-22: qty 30

## 2014-09-22 MED ORDER — IOHEXOL 300 MG/ML  SOLN
50.0000 mL | Freq: Once | INTRAMUSCULAR | Status: AC | PRN
  Administered 2014-09-22: 50 mL via ORAL

## 2014-09-22 MED ORDER — HYDROCODONE-ACETAMINOPHEN 5-325 MG PO TABS: 1.0000 | ORAL_TABLET | ORAL | Status: DC | PRN

## 2014-09-22 MED ORDER — ONDANSETRON HCL 4 MG/2ML IJ SOLN
4.0000 mg | Freq: Once | INTRAMUSCULAR | Status: AC
  Administered 2014-09-22: 4 mg via INTRAMUSCULAR
  Filled 2014-09-22: qty 2

## 2014-09-22 MED ORDER — ONDANSETRON HCL 4 MG/2ML IJ SOLN
4.0000 mg | Freq: Once | INTRAMUSCULAR | Status: AC
  Administered 2014-09-22: 4 mg via INTRAVENOUS
  Filled 2014-09-22: qty 2

## 2014-09-22 NOTE — ED Notes (Signed)
Patient complaining of upper abdominal pain that started yesterday. States is now spreading into epigastric area and back. States pain comes and goes. Also complaining of nausea.

## 2014-09-22 NOTE — Discharge Instructions (Signed)
Abdominal Pain, Women °Abdominal (stomach, pelvic, or belly) pain can be caused by many things. It is important to tell your doctor: °· The location of the pain. °· Does it come and go or is it present all the time? °· Are there things that start the pain (eating certain foods, exercise)? °· Are there other symptoms associated with the pain (fever, nausea, vomiting, diarrhea)? °All of this is helpful to know when trying to find the cause of the pain. °CAUSES  °· Stomach: virus or bacteria infection, or ulcer. °· Intestine: appendicitis (inflamed appendix), regional ileitis (Crohn's disease), ulcerative colitis (inflamed colon), irritable bowel syndrome, diverticulitis (inflamed diverticulum of the colon), or cancer of the stomach or intestine. °· Gallbladder disease or stones in the gallbladder. °· Kidney disease, kidney stones, or infection. °· Pancreas infection or cancer. °· Fibromyalgia (pain disorder). °· Diseases of the female organs: °¨ Uterus: fibroid (non-cancerous) tumors or infection. °¨ Fallopian tubes: infection or tubal pregnancy. °¨ Ovary: cysts or tumors. °¨ Pelvic adhesions (scar tissue). °¨ Endometriosis (uterus lining tissue growing in the pelvis and on the pelvic organs). °¨ Pelvic congestion syndrome (female organs filling up with blood just before the menstrual period). °¨ Pain with the menstrual period. °¨ Pain with ovulation (producing an egg). °¨ Pain with an IUD (intrauterine device, birth control) in the uterus. °¨ Cancer of the female organs. °· Functional pain (pain not caused by a disease, may improve without treatment). °· Psychological pain. °· Depression. °DIAGNOSIS  °Your doctor will decide the seriousness of your pain by doing an examination. °· Blood tests. °· X-rays. °· Ultrasound. °· CT scan (computed tomography, special type of X-ray). °· MRI (magnetic resonance imaging). °· Cultures, for infection. °· Barium enema (dye inserted in the large intestine, to better view it with  X-rays). °· Colonoscopy (looking in intestine with a lighted tube). °· Laparoscopy (minor surgery, looking in abdomen with a lighted tube). °· Major abdominal exploratory surgery (looking in abdomen with a large incision). °TREATMENT  °The treatment will depend on the cause of the pain.  °· Many cases can be observed and treated at home. °· Over-the-counter medicines recommended by your caregiver. °· Prescription medicine. °· Antibiotics, for infection. °· Birth control pills, for painful periods or for ovulation pain. °· Hormone treatment, for endometriosis. °· Nerve blocking injections. °· Physical therapy. °· Antidepressants. °· Counseling with a psychologist or psychiatrist. °· Minor or major surgery. °HOME CARE INSTRUCTIONS  °· Do not take laxatives, unless directed by your caregiver. °· Take over-the-counter pain medicine only if ordered by your caregiver. Do not take aspirin because it can cause an upset stomach or bleeding. °· Try a clear liquid diet (broth or water) as ordered by your caregiver. Slowly move to a bland diet, as tolerated, if the pain is related to the stomach or intestine. °· Have a thermometer and take your temperature several times a day, and record it. °· Bed rest and sleep, if it helps the pain. °· Avoid sexual intercourse, if it causes pain. °· Avoid stressful situations. °· Keep your follow-up appointments and tests, as your caregiver orders. °· If the pain does not go away with medicine or surgery, you may try: °¨ Acupuncture. °¨ Relaxation exercises (yoga, meditation). °¨ Group therapy. °¨ Counseling. °SEEK MEDICAL CARE IF:  °· You notice certain foods cause stomach pain. °· Your home care treatment is not helping your pain. °· You need stronger pain medicine. °· You want your IUD removed. °· You feel faint or   lightheaded. °· You develop nausea and vomiting. °· You develop a rash. °· You are having side effects or an allergy to your medicine. °SEEK IMMEDIATE MEDICAL CARE IF:  °· Your  pain does not go away or gets worse. °· You have a fever. °· Your pain is felt only in portions of the abdomen. The right side could possibly be appendicitis. The left lower portion of the abdomen could be colitis or diverticulitis. °· You are passing blood in your stools (bright red or black tarry stools, with or without vomiting). °· You have blood in your urine. °· You develop chills, with or without a fever. °· You pass out. °MAKE SURE YOU:  °· Understand these instructions. °· Will watch your condition. °· Will get help right away if you are not doing well or get worse. °Document Released: 04/23/2007 Document Revised: 11/10/2013 Document Reviewed: 05/13/2009 °ExitCare® Patient Information ©2015 [employer]. This information is not intended to replace advice given to you by your health care provider. Make sure you discuss any questions you have with your health care provider. ° °

## 2014-09-22 NOTE — ED Provider Notes (Signed)
CSN: [id number]     Arrival date & time 09/21/14  2354 History   First MD Initiated Contact with Patient 09/22/14 0022     Chief Complaint  Patient presents with  . Abdominal Pain     (Consider location/radiation/quality/duration/timing/severity/associated sxs/prior Treatment) The history is provided by the patient.   [name] is a 53 y.o. female with a history of constipation predominant IBS, Jerrye Bushy with prior esophageal dilitation procedure and diverticular disese presenting with upper abdominal pain starting yesterday, gradually which has become much more severe tonight while at work tonight and has now migrated to her epigastric area with radiation into her mid back.  Her pain is constant with occasional worse sharp stabs of pain. Denies chest pain and sob but deep inspiration worsens pain.  It is accompanied by nausea without emesis.  She denies diarrhea, her last bm was yesterday with her normal constipated stools. She reports passing a large amount of blood with a bowel movement last week, but none since.   She has had episodes of feeling hot, but could be just an increase in her hot flashes.  No documented fevers. She last ate breakfast this am which worsened pain.  She has found no alleviators.      Past Medical History  Diagnosis Date  . Irritable bowel syndrome   . Horseshoe kidney   . DDD (degenerative disc disease)     spinal injections  . Hypercholesterolemia   . Diverticular disease   . DDD (degenerative disc disease), lumbar    Past Surgical History  Procedure Laterality Date  . Tonsillectomy    . Tubal ligation    . Wrist surgery    . Colonoscopy  07/01/2009    VQX:[id number] seen in the ascending and sigmoid colon/small internal hemorrhoids/6-mm sessile polyp/4-mm sessile polyp.3-mm sessile polyp removed. simple adenomas  . Esophagogastroduodenoscopy  07/01/2009    LKJ:ZPHXTA esophagus/dilation to 16 mm possible proximal cervical web  . Cervical dysplasia     . Colonoscopy N/A 03/25/2013    Procedure: COLONOSCOPY;  Surgeon: Danie Binder, MD;  [facility]: [facility];  Service: Endoscopy;  Laterality: N/A;  10:15  . Esophagogastroduodenoscopy (egd) with esophageal dilation N/A 03/25/2013    Procedure: ESOPHAGOGASTRODUODENOSCOPY (EGD) WITH ESOPHAGEAL DILATION;  Surgeon: Danie Binder, MD;  [facility]: [facility];  Service: Endoscopy;  Laterality: N/A;   Family History  Problem Relation Age of Onset  . Colon cancer Brother     mid 21s, deceased  . Colon cancer Father     diagnosed at 47, deceased   History  Substance Use Topics  . Smoking status: Former Research scientist (life sciences)  . Smokeless tobacco: Not on file  . Alcohol Use: No   OB History    No data available     Review of Systems  Constitutional: Negative for fever and chills.  HENT: Negative for congestion and sore throat.   Eyes: Negative.   Respiratory: Negative for chest tightness and shortness of breath.   Cardiovascular: Negative for chest pain.  Gastrointestinal: Positive for nausea, abdominal pain and constipation. Negative for vomiting.  Genitourinary: Negative.  Negative for dysuria.  Musculoskeletal: Negative for joint swelling, arthralgias and neck pain.  Skin: Negative.  Negative for rash and wound.  Neurological: Negative for dizziness, weakness, light-headedness, numbness and headaches.  Psychiatric/Behavioral: Negative.       Allergies  Penicillins and Codeine  Home Medications   Prior to Admission medications   Medication Sig Start Date End Date Taking? Authorizing Provider  acetaminophen (TYLENOL) 500 MG tablet Take 1,000 mg by mouth every 6 (six) hours as needed for pain.   Yes Historical Provider, MD  Simethicone (GAS-X EXTRA STRENGTH PO) Take 2 tablets by mouth daily as needed. Gas/stomach discomfort   Yes Historical Provider, MD  HYDROcodone-acetaminophen (NORCO) 5-325 MG per tablet Take 1 tablet by mouth every 4 (four) hours as needed. 12/13/13   Daleen Bo, MD   Iron-FA-B Cmp-C-Biot-Probiotic (FUSION PLUS PO) Take 1 tablet by mouth daily.    Historical Provider, MD  ondansetron (ZOFRAN) 8 MG tablet Take 1 tablet (8 mg total) by mouth every 8 (eight) hours as needed for nausea or vomiting. 12/13/13   Daleen Bo, MD   BP 120/92 mmHg  Pulse 76  Temp(Src) 97.6 F (36.4 C) (Oral)  Resp 20  Ht 5\' 2"  (1.575 m)  Wt 145 lb (65.772 kg)  BMI 26.51 kg/m2  SpO2 97% Physical Exam  Constitutional: She appears well-developed and well-nourished.  HENT:  Head: Normocephalic and atraumatic.  Eyes: Conjunctivae are normal.  Neck: Normal range of motion.  Cardiovascular: Normal rate, regular rhythm, normal heart sounds and intact distal pulses.   Pulmonary/Chest: Effort normal and breath sounds normal. She has no wheezes.  Abdominal: Soft. Bowel sounds are normal. She exhibits distension. She exhibits no abdominal bruit. There is tenderness in the right upper quadrant and epigastric area. There is no guarding, no CVA tenderness and negative Murphy's sign.  Mild abdominal distention without increased tympany. Bowel sounds normoactive.  Musculoskeletal: Normal range of motion.  Neurological: She is alert.  Skin: Skin is warm and dry.  Psychiatric: She has a normal mood and affect.  Nursing note and vitals reviewed.   ED Course  Procedures (including critical care time) Labs Review Labs Reviewed  COMPREHENSIVE METABOLIC PANEL - Abnormal; Notable for the following:    Glucose, Bld 111 (*)    All other components within normal limits  TROPONIN I  CBC WITH DIFFERENTIAL/PLATELET  LIPASE, BLOOD  URINALYSIS, ROUTINE W REFLEX MICROSCOPIC    Imaging Review No results found.   EKG Interpretation   Date/Time:  Tuesday September 22 2014 00:14:21 EDT Ventricular Rate:  78 PR Interval:  154 QRS Duration: 72 QT Interval:  370 QTC Calculation: 421 R Axis:   53 Text Interpretation:  Normal sinus rhythm Normal ECG Confirmed by  Lita Mains  MD, DAVID ([id number]) on  09/22/2014 2:13:52 AM      MDM   Final diagnoses:  Abdominal pain    Patients labs and/or radiological studies were reviewed and considered during the medical decision making and disposition process.  Results were also discussed with patient.    Pt still with pain but improved with morphine given, zofran and GI cocktail helped with nausea, but still present.  Discussed with Dr Lita Mains.  CT abd/pelvis ordered at this time.        Evalee Jefferson, PA-C 09/22/14 0215  Evalee Jefferson, PA-C 09/22/14 [id number]  Julianne Rice, MD 09/22/14 [phone]

## 2014-12-02 ENCOUNTER — Telehealth: Payer: Self-pay | Admitting: Gastroenterology

## 2014-12-02 NOTE — Telephone Encounter (Signed)
Reviewed

## 2014-12-02 NOTE — Telephone Encounter (Signed)
Patient's husband came to front office window saying that SF had told them to stop by and get a financial assistance paper for her to fill out because she was in dire need of assistance with her IBS and needed financial assistance. I handed him a [place] Form for her to complete and he said that he would bring it back to [place] once she had completed it.

## 2015-01-20 ENCOUNTER — Other Ambulatory Visit: Payer: Self-pay

## 2015-01-20 ENCOUNTER — Ambulatory Visit (INDEPENDENT_AMBULATORY_CARE_PROVIDER_SITE_OTHER): Payer: Self-pay | Admitting: Gastroenterology

## 2015-01-20 ENCOUNTER — Encounter: Payer: Self-pay | Admitting: Gastroenterology

## 2015-01-20 VITALS — BP 112/73 | HR 88 | Temp 98.0°F | Ht 62.0 in | Wt 148.6 lb

## 2015-01-20 DIAGNOSIS — K648 Other hemorrhoids: Secondary | ICD-10-CM

## 2015-01-20 DIAGNOSIS — K589 Irritable bowel syndrome without diarrhea: Secondary | ICD-10-CM

## 2015-01-20 DIAGNOSIS — K219 Gastro-esophageal reflux disease without esophagitis: Secondary | ICD-10-CM

## 2015-01-20 DIAGNOSIS — R1314 Dysphagia, pharyngoesophageal phase: Secondary | ICD-10-CM

## 2015-01-20 DIAGNOSIS — R1310 Dysphagia, unspecified: Secondary | ICD-10-CM

## 2015-01-20 MED ORDER — OMEPRAZOLE 20 MG PO CPDR: DELAYED_RELEASE_CAPSULE | ORAL | Status: DC

## 2015-01-20 MED ORDER — LINACLOTIDE 145 MCG PO CAPS: ORAL_CAPSULE | ORAL | Status: DC

## 2015-01-20 NOTE — Assessment & Plan Note (Addendum)
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 DISCUSSED BENEFITS, RISKS, AND MANAGEMENT OF REFLUX.IF SHE TAKES HER MEDICINES SHE WILL NEED FEWER ENDOSCOPIES TO STRETCH HER ESOPHAGUS. SOFT MECH/LOW FAT DIET ADD OMEPRAZOLE DAILY LOSE 10 LBS FOLLOW UP IN 4 MOS.

## 2015-01-20 NOTE — Progress Notes (Signed)
Subjective:    Patient ID: [name], female    DOB: 1960-07-25, 54 y.o.   MRN: [id number]  Raven Curet, MD  HPI IBS CONSTIPATION BEEN BAD AND IN THE ED TWICE THIS YEAR WITH SEVERE RUQ ABD PAIN AND CONSTIPATION. PROBLEM SWALLOWING: SOLIDS AND LIQUIDS, PILLS GET CAUGHT.  WEIGHT: 153 LBS 2014 AND NOW 148 LBS. WAS UP TO REPORTEDLY 187 LBS DUE TO DEPRESSION/EATING.  CHEST PAIN COMES AND GOES-STRESS/FOOD. HEARTBURN: THROAT TO TOP OF STOMACH. COMES AND GOES ESPECIALLY AFTER BURPING. TAKES TUMS. HASN'T BEEN TO OFC AND NO MEDS GIVEN BY PCP.  HASN'T TRIED LINZESS OR AMITIZA. BLOOD IN STOOL: COUPLE TIMES A MO. MAY POUR OUT. LAST CBC MAR 2016 STABLE FOR 1 YEAR. NAUSEA: SEVERAL TIMES A WEEK FROM STOOL SITTING IN THERE.   PT DENIES FEVER, CHILLS, vomiting, melena, diarrhea, SHORTNESS OF BREATH,  OR problems with sedation.   Past Medical History  Diagnosis Date  . Irritable bowel syndrome   . Horseshoe kidney   . DDD (degenerative disc disease)     spinal injections  . Hypercholesterolemia   . Diverticular disease   . DDD (degenerative disc disease), lumbar    Past Surgical History  Procedure Laterality Date  . Tonsillectomy    . Tubal ligation    . Wrist surgery    . Colonoscopy  07/01/2009    WLN:[id number] seen in the ascending and sigmoid colon/small internal hemorrhoids/6-mm sessile polyp/4-mm sessile polyp.3-mm sessile polyp removed. simple adenomas  . Esophagogastroduodenoscopy  07/01/2009    YCX:KGYJEH esophagus/dilation to 16 mm possible proximal cervical web  . Cervical dysplasia    . Colonoscopy N/A 03/25/2013    Procedure: COLONOSCOPY;  Surgeon: Danie Binder, MD;  [facility];  Service: Endoscopy;  Laterality: N/A;  10:15  . Esophagogastroduodenoscopy (egd) with esophageal dilation N/A 03/25/2013    Procedure: ESOPHAGOGASTRODUODENOSCOPY (EGD) WITH ESOPHAGEAL DILATION;  Surgeon: Danie Binder, MD;  [facility];  Service: Endoscopy;   Laterality: N/A;   Allergies  Allergen Reactions  . Penicillins Anaphylaxis and Itching  . Codeine Nausea And Vomiting   Current Outpatient Prescriptions  Medication Sig Dispense Refill  . acetaminophen (TYLENOL) 500 MG tablet Take 1,000 mg by mouth every 6 (six) hours as needed for pain.    .      . Iron-FA-B Cmp-C-Biot-Probiotic (FUSION PLUS PO) Take 1 tablet by mouth daily.    .      . Simethicone (GAS-X EXTRA STRENGTH PO) Take 2 tablets by mouth daily as needed. Gas/stomach discomfort     Family History  Problem Relation Age of Onset  . Colon cancer Brother     mid 29s, deceased  . Colon cancer Father     diagnosed at 95, deceased   Review of Systems PER HPI OTHERWISE ALL SYSTEMS ARE NEGATIVE.     Objective:   Physical Exam  Constitutional: She is oriented to person, place, and time. She appears well-developed and well-nourished. No distress.  HENT:  Head: Normocephalic and atraumatic.  Mouth/Throat: Oropharynx is clear and moist. No oropharyngeal exudate.  Eyes: Pupils are equal, round, and reactive to light. No scleral icterus.  Neck: Normal range of motion. Neck supple.  Cardiovascular: Normal rate, regular rhythm and normal heart sounds.   Pulmonary/Chest: Effort normal and breath sounds normal. No respiratory distress.  Abdominal: Soft. Bowel sounds are normal. She exhibits no distension. There is no tenderness.  Musculoskeletal: She exhibits no edema.  Lymphadenopathy:    She has no  cervical adenopathy.  Neurological: She is alert and oriented to person, place, and time.  NO  NEW FOCAL DEFICITS   Psychiatric:  SLIGHTLY DEPRESSED MOOD,  FLAT AFFECT   Vitals reviewed.         Assessment & Plan:

## 2015-01-20 NOTE — Patient Instructions (Signed)
DRINK WATER TO KEEP YOUR URINE LIGHT YELLOW.  FOLLOW A LOW FAT/SOFT MECHANICAL DIET. MEATS SHOULD BE BAKED, BROILED, OR BOILED.  AVOID FRIED FOODS. MEATS SHOULD BE CHOPPED OR GROUND ONLY. DO NOT EAT CHUNKS OF ANYTHING. SEE INFO BELOW.  ADD LINZESS 30 MINS PRIOR TO BREAKFAST. IT MAY CAUSE EXPLOSIVE DIARRHEA.  ADD OMEPRAZOLE.  TAKE 30 MINUTES PRIOR TO YOUR FIRST MEAL.  LOSE 10 LBS.  YOU WILL HAVE AN UPPER ENDOSCOPY TO STRETCH YOUR ESOPHAGUS ON JUL 28.  FOLLOW UP IN 4 MOS.     SOFT MECHANICAL DIET This SOFT MECHANICAL DIET is restricted to:  Foods that are moist, soft-textured, and easy to chew and swallow.   Meats that are ground or are minced no larger than one-quarter inch pieces. Meats are moist with gravy or sauce added.   Foods that do not include bread or bread-like textures except soft pancakes, well-moistened with syrup or sauce.   Textures with some chewing ability required.   Casseroles without rice.   Cooked vegetables that are less than half an inch in size and easily mashed with a fork. No cooked corn, peas, broccoli, cauliflower, cabbage, Brussels sprouts, asparagus, or other fibrous, non-tender or rubbery cooked vegetables.   Canned fruit except for pineapple. Fruit must be cut into pieces no larger than half an inch in size.   Foods that do not include nuts, seeds, coconut, or sticky textures.   FOOD TEXTURES FOR DYSPHAGIA DIET LEVEL 2 -SOFT MECHANICAL DIET (includes all foods on Dysphagia Diet Level 1 - Pureed, in addition to the foods listed below)  FOOD GROUP: Breads. RECOMMENDED: Soft pancakes, well-moistened with syrup or sauce.  AVOID: All others.  FOOD GROUP: Cereals.  RECOMMENDED: Cooked cereals with little texture, including oatmeal. Unprocessed wheat bran stirred into cereals for bulk. Note: If thin liquids are restricted, it is important that all of the liquid is absorbed into the cereal.  AVOID: All dry cereals and any cooked cereals that may  contain flax seeds or other seeds or nuts. Whole-grain, dry, or coarse cereals. Cereals with nuts, seeds, dried fruit, and/or coconut.  FOOD GROUP: Desserts. RECOMMENDED: Pudding, custard. Soft fruit pies with bottom crust only. Canned fruit (excluding pineapple). Soft, moist cakes with icing.Frozen malts, milk shakes, frozen yogurt, eggnog, nutritional supplements, ice cream, sherbet, regular or sugar-free gelatin, or any foods that become thin liquid at either room (70 F) or body temperature (98 F).  AVOID: Dry, coarse cakes and cookies. Anything with nuts, seeds, coconut, pineapple, or dried fruit. Breakfast yogurt with nuts. Rice or bread pudding.  FOOD GROUP: Fats. RECOMMENDED: Butter, margarine, cream for cereal (depending on liquid consistency recommendations), gravy, cream sauces, sour cream, sour cream dips with soft additives, mayonnaise, salad dressings, cream cheese, cream cheese spreads with soft additives, whipped toppings.  AVOID: All fats with coarse or chunky additives.  FOOD GROUP: Fruits. RECOMMENDED: Soft drained, canned, or cooked fruits without seeds or skin. Fresh soft and ripe banana. Fruit juices with a small amount of pulp. If thin liquids are restricted, fruit juices should be thickened to appropriate consistency.  AVOID: Fresh or frozen fruits. Cooked fruit with skin or seeds. Dried fruits. Fresh, canned, or cooked pineapple.  FOOD GROUP: Meats and Meat Substitutes. (Meat pieces should not exceed 1/4 of an inch cube and should be tender.) RECOMMENDED: Moistened ground or cooked meat, poultry, or fish. Moist ground or tender meat may be served with gravy or sauce. Casseroles without rice. Moist macaroni and cheese, well-cooked pasta with  meat sauce, tuna noodle casserole, soft, moist lasagna. Moist meatballs, meatloaf, or fish loaf. Protein salads, such as tuna or egg without large chunks, celery, or onion. Cottage cheese, smooth quiche without large chunks. Poached,  scrambled, or soft-cooked eggs (egg yolks should not be "runny" but should be moist and able to be mashed with butter, margarine, or other moisture added to them). (Cook eggs to 160 F or use pasteurized eggs for safety.) Souffls may have small, soft chunks. Tofu. Well-cooked, slightly mashed, moist legumes, such as baked beans. All meats or protein substitutes should be served with sauces or moistened to help maintain cohesiveness in the oral cavity.  AVOID: Dry meats, tough meats (such as bacon, sausage, hot dogs, bratwurst). Dry casseroles or casseroles with rice or large chunks. Peanut butter. Cheese slices and cubes. Hard-cooked or crisp fried eggs. Sandwiches.Pizza.  FOOD GROUP: Potatoes and Starches. RECOMMENDED: Well-cooked, moistened, boiled, baked, or mashed potatoes. Well-cooked shredded hash brown potatoes that are not crisp. (All potatoes need to be moist and in sauces.)Well-cooked noodles in sauce. Spaetzel or soft dumplings that have been moistened with butter or gravy.  AVOID: Potato skins and chips. Fried or French-fried potatoes. Rice.  FOOD GROUP: Soups. RECOMMENDED: Soups with easy-to-chew or easy-to-swallow meats or vegetables: Particle sizes in soups should be less than 1/2 inch. Soups will need to be thickened to appropriate consistency if soup is thinner than prescribed liquid consistency.  AVOID: Soups with large chunks of meat and vegetables. Soups with rice, corn, peas.  FOOD GROUP: Vegetables. RECOMMENDED: All soft, well-cooked vegetables. Vegetables should be less than a half inch. Should be easily mashed with a fork.  AVOID: Cooked corn and peas. Broccoli, cabbage, Brussels sprouts, asparagus, or other fibrous, non-tender or rubbery cooked vegetables.  FOOD GROUP: Miscellaneous. RECOMMENDED: Jams and preserves without seeds, jelly. Sauces, salsas, etc., that may have small tender chunks less than 1/2 inch. Soft, smooth chocolate bars that are easily  chewed.  AVOID: Seeds, nuts, coconut, or sticky foods. Chewy candies such as caramels or licorice.

## 2015-01-20 NOTE — Assessment & Plan Note (Addendum)
NO WARNING SIGNS/SYMPTOMS. TCS UP TO DATE. SYMPTOMS MOST LIKELY DUE EXACERBATED BY CONSTIPATION   DRINK WATER EAT FIBER AVOID CONSTIPATION ADD LINZESS 30 MINS PRIOR TO BREAKFAST.  PREPARATION H PRN. FOLLOW UP IN 4 MOS.

## 2015-01-20 NOTE — Assessment & Plan Note (Addendum)
SYMPTOMS NOT WELL CONTROLLED WITH TUMS.  ADD OMEPRAZOLE DAILY LOW FAT DIET LOSE 10 LBS DISCUSSED BENEFITS, RISKS, AND MANAGEMENT OF REFLUX.IF SHE TAKES HER MEDICINES SHE WILL NEED FEWER ENDOSCOPIES TO STRETCH HER ESOPHAGUS. FOLLOW UP IN 4 MOS.

## 2015-01-20 NOTE — Assessment & Plan Note (Signed)
SYMPTOMS NOT CONTROLLED.   DRINK WATER EAT FIBER AVOID CONSTIPATION ADD LINZESS 30 MINS PRIOR TO BREAKFAST.  PREPARATION H PRN. FOLLOW UP IN 4 MOS.

## 2015-01-25 NOTE — Progress Notes (Signed)
CC'ED TO PCP 

## 2015-02-04 ENCOUNTER — Encounter (HOSPITAL_COMMUNITY)
Admission: RE | Disposition: A | Discharge status: Home / Self Care | Payer: Self-pay | Source: Ambulatory Visit | Attending: Gastroenterology

## 2015-02-04 ENCOUNTER — Ambulatory Visit (HOSPITAL_COMMUNITY)
Admission: RE | Admit: 2015-02-04 | Discharge: 2015-02-04 | Disposition: A | Discharge status: Home / Self Care | Payer: Self-pay | Source: Ambulatory Visit | Attending: Gastroenterology | Admitting: Gastroenterology

## 2015-02-04 ENCOUNTER — Encounter (HOSPITAL_COMMUNITY): Payer: Self-pay | Admitting: *Deleted

## 2015-02-04 DIAGNOSIS — Z800 Family history of malignant neoplasm of digestive organs: Secondary | ICD-10-CM | POA: Insufficient documentation

## 2015-02-04 DIAGNOSIS — E780 Pure hypercholesterolemia: Secondary | ICD-10-CM | POA: Insufficient documentation

## 2015-02-04 DIAGNOSIS — K5710 Diverticulosis of small intestine without perforation or abscess without bleeding: Secondary | ICD-10-CM | POA: Insufficient documentation

## 2015-02-04 DIAGNOSIS — R1314 Dysphagia, pharyngoesophageal phase: Secondary | ICD-10-CM | POA: Insufficient documentation

## 2015-02-04 DIAGNOSIS — K222 Esophageal obstruction: Secondary | ICD-10-CM | POA: Insufficient documentation

## 2015-02-04 DIAGNOSIS — R1310 Dysphagia, unspecified: Secondary | ICD-10-CM | POA: Insufficient documentation

## 2015-02-04 DIAGNOSIS — K2950 Unspecified chronic gastritis without bleeding: Secondary | ICD-10-CM | POA: Insufficient documentation

## 2015-02-04 DIAGNOSIS — R109 Unspecified abdominal pain: Secondary | ICD-10-CM | POA: Insufficient documentation

## 2015-02-04 DIAGNOSIS — K2970 Gastritis, unspecified, without bleeding: Secondary | ICD-10-CM

## 2015-02-04 HISTORY — PX: ESOPHAGEAL DILATION: SHX303

## 2015-02-04 HISTORY — PX: ESOPHAGOGASTRODUODENOSCOPY: SHX5428

## 2015-02-04 SURGERY — EGD (ESOPHAGOGASTRODUODENOSCOPY)
Anesthesia: Moderate Sedation

## 2015-02-04 MED ORDER — LIDOCAINE VISCOUS 2 % MT SOLN
OROMUCOSAL | Status: DC | PRN
  Administered 2015-02-04: 1 via OROMUCOSAL

## 2015-02-04 MED ORDER — PROMETHAZINE HCL 25 MG/ML IJ SOLN: 12.5000 mg | Freq: Once | INTRAMUSCULAR | Status: DC

## 2015-02-04 MED ORDER — MEPERIDINE HCL 100 MG/ML IJ SOLN
INTRAMUSCULAR | Status: DC | PRN
  Administered 2015-02-04 (×4): 25 mg via INTRAVENOUS

## 2015-02-04 MED ORDER — SODIUM CHLORIDE 0.9 % IJ SOLN
INTRAMUSCULAR | Status: AC
  Filled 2015-02-04: qty 3

## 2015-02-04 MED ORDER — LIDOCAINE VISCOUS 2 % MT SOLN
OROMUCOSAL | Status: AC
  Filled 2015-02-04: qty 15

## 2015-02-04 MED ORDER — MIDAZOLAM HCL 5 MG/5ML IJ SOLN
INTRAMUSCULAR | Status: DC | PRN
  Administered 2015-02-04 (×2): 1 mg via INTRAVENOUS
  Administered 2015-02-04 (×2): 2 mg via INTRAVENOUS

## 2015-02-04 MED ORDER — PROMETHAZINE HCL 25 MG/ML IJ SOLN
INTRAMUSCULAR | Status: DC | PRN
  Administered 2015-02-04: 12.5 mg via INTRAVENOUS

## 2015-02-04 MED ORDER — MIDAZOLAM HCL 5 MG/5ML IJ SOLN
INTRAMUSCULAR | Status: AC
  Filled 2015-02-04: qty 10

## 2015-02-04 MED ORDER — PROMETHAZINE HCL 25 MG/ML IJ SOLN
INTRAMUSCULAR | Status: AC
  Filled 2015-02-04: qty 1

## 2015-02-04 MED ORDER — SODIUM CHLORIDE 0.9 % IV SOLN
INTRAVENOUS | Status: DC
  Administered 2015-02-04: 08:00:00 via INTRAVENOUS

## 2015-02-04 MED ORDER — MINERAL OIL PO OIL
TOPICAL_OIL | ORAL | Status: AC
  Filled 2015-02-04: qty 30

## 2015-02-04 MED ORDER — MEPERIDINE HCL 100 MG/ML IJ SOLN
INTRAMUSCULAR | Status: AC
  Filled 2015-02-04: qty 2

## 2015-02-04 NOTE — Interval H&P Note (Signed)
History and Physical Interval Note:  02/04/2015 9:12 AM  [name]  has presented today for surgery, with the diagnosis of dysphagia  The various methods of treatment have been discussed with the patient and family. After consideration of risks, benefits and other options for treatment, the patient has consented to  Procedure(s) with comments: ESOPHAGOGASTRODUODENOSCOPY (EGD) (N/A) - [phone]-moved to [phone] Office to notify pt ESOPHAGEAL DILATION (N/A) as a surgical intervention .  The patient's history has been reviewed, patient examined, no change in status, stable for surgery.  I have reviewed the patient's chart and labs.  Questions were answered to the patient's satisfaction.     [employer]

## 2015-02-04 NOTE — Discharge Instructions (Signed)
I dilated your esophagus. You have a stricture near the base of your esophagus.  You have  A SMALL HIATAL HERNIA, gastritis & A SMALL DUODENAL DIVERTICULUM. I biopsied your stomach.   DRINK WATER TO KEEP YOUR URINE LIGHT YELLOW.  FOLLOW A LOW FAT DIET. MEATS SHOULD BE BAKED, BROILED, OR BOILED. AVOID FRIED FOODS.   ADD LINZESS 30 MINS PRIOR TO BREAKFAST. IT MAY CAUSE EXPLOSIVE DIARRHEA.  ADD OMEPRAZOLE. TAKE 30 MINUTES PRIOR TO YOUR FIRST MEAL TO TREAT GASTRITIS AND REFLUX.  YOUR BIOPSY RESULTS WILL BE AVAILABLE IN MY CHART AFTER JUL 30 AND MY OFFICE WILL CONTACT YOU IN 10-14 DAYS WITH YOUR RESULTS.   FOLLOW UP IN NOV 2016.    UPPER ENDOSCOPY AFTER CARE Read the instructions outlined below and refer to this sheet in the next week. These discharge instructions provide you with general information on caring for yourself after you leave the hospital. While your treatment has been planned according to the most current medical practices available, unavoidable complications occasionally occur. If you have any problems or questions after discharge, call DR. Melchizedek Espinola, [phone].  ACTIVITY  You may resume your regular activity, but move at a slower pace for the next 24 hours.   Take frequent rest periods for the next 24 hours.   Walking will help get rid of the air and reduce the bloated feeling in your belly (abdomen).   No driving for 24 hours (because of the medicine (anesthesia) used during the test).   You may shower.   Do not sign any important legal documents or operate any machinery for 24 hours (because of the anesthesia used during the test).    NUTRITION  Drink plenty of fluids.   You may resume your normal diet as instructed by your doctor.   Begin with a light meal and progress to your normal diet. Heavy or fried foods are harder to digest and may make you feel sick to your stomach (nauseated).   Avoid alcoholic beverages for 24 hours or as instructed.     MEDICATIONS  You may resume your normal medications.   WHAT YOU CAN EXPECT TODAY  Some feelings of bloating in the abdomen.   Passage of more gas than usual.    IF YOU HAD A BIOPSY TAKEN DURING THE UPPER ENDOSCOPY:  Eat a soft diet IF YOU HAVE NAUSEA, BLOATING, ABDOMINAL PAIN, OR VOMITING.    FINDING OUT THE RESULTS OF YOUR TEST Not all test results are available during your visit. DR. Oneida Alar WILL CALL YOU WITHIN 14 DAYS OF YOUR PROCEDUE WITH YOUR RESULTS. Do not assume everything is normal if you have not heard from DR. Tarhonda Hollenberg, CALL HER OFFICE AT [phone].  SEEK IMMEDIATE MEDICAL ATTENTION AND CALL THE OFFICE: [phone] IF:  You have more than a spotting of blood in your stool.   Your belly is swollen (abdominal distention).   You are nauseated or vomiting.   You have a temperature over 101F.   You have abdominal pain or discomfort that is severe or gets worse throughout the day.  Gastritis  Gastritis is an inflammation (the body's way of reacting to injury and/or infection) of the stomach. It is often caused by viral or bacterial (germ) infections. It can also be caused BY ASPIRIN, BC/GOODY POWDER'S, (IBUPROFEN) MOTRIN, OR ALEVE (NAPROXEN), chemicals (including alcohol), SPICY FOODS, and medications. This illness may be associated with generalized malaise (feeling tired, not well), UPPER ABDOMINAL STOMACH cramps, and fever. One common bacterial cause of gastritis is  an organism known as H. Pylori. This can be treated with antibiotics.   ESOPHAGEAL STRICTURE  Esophageal strictures can be caused by stomach acid backing up into the tube that carries food from the mouth down to the stomach (lower esophagus).  TREATMENT There are a number of  medicines used to treat reflux/stricture, including: Antacids.  Proton-pump inhibitors: OMEPRAZOLE  HOME CARE INSTRUCTIONS Eat 2-3 hours before going to bed.  Try to reach and maintain a healthy weight.  Do not eat just a  few very large meals. Instead, eat 4 TO 6 smaller meals throughout the day.  Try to identify foods and beverages that make your symptoms worse, and avoid these.  Avoid tight clothing.  Do not exercise right after eating.

## 2015-02-04 NOTE — H&P (View-Only) (Signed)
Subjective:    Patient ID: [name], female    DOB: 1961/01/25, 54 y.o.   MRN: [id number]  Raven Curet, MD  HPI IBS CONSTIPATION BEEN BAD AND IN THE ED TWICE THIS YEAR WITH SEVERE RUQ ABD PAIN AND CONSTIPATION. PROBLEM SWALLOWING: SOLIDS AND LIQUIDS, PILLS GET CAUGHT.  WEIGHT: 153 LBS 2014 AND NOW 148 LBS. WAS UP TO REPORTEDLY 187 LBS DUE TO DEPRESSION/EATING.  CHEST PAIN COMES AND GOES-STRESS/FOOD. HEARTBURN: THROAT TO TOP OF STOMACH. COMES AND GOES ESPECIALLY AFTER BURPING. TAKES TUMS. HASN'T BEEN TO OFC AND NO MEDS GIVEN BY PCP.  HASN'T TRIED LINZESS OR AMITIZA. BLOOD IN STOOL: COUPLE TIMES A MO. MAY POUR OUT. LAST CBC MAR 2016 STABLE FOR 1 YEAR. NAUSEA: SEVERAL TIMES A WEEK FROM STOOL SITTING IN THERE.   PT DENIES FEVER, CHILLS, vomiting, melena, diarrhea, SHORTNESS OF BREATH,  OR problems with sedation.   Past Medical History  Diagnosis Date  . Irritable bowel syndrome   . Horseshoe kidney   . DDD (degenerative disc disease)     spinal injections  . Hypercholesterolemia   . Diverticular disease   . DDD (degenerative disc disease), lumbar    Past Surgical History  Procedure Laterality Date  . Tonsillectomy    . Tubal ligation    . Wrist surgery    . Colonoscopy  07/01/2009    EUM:[id number] seen in the ascending and sigmoid colon/small internal hemorrhoids/6-mm sessile polyp/4-mm sessile polyp.3-mm sessile polyp removed. simple adenomas  . Esophagogastroduodenoscopy  07/01/2009    MGQ:QPYPPJ esophagus/dilation to 16 mm possible proximal cervical web  . Cervical dysplasia    . Colonoscopy N/A 03/25/2013    Procedure: COLONOSCOPY;  Surgeon: Danie Binder, MD;  Location: [facility];  Service: Endoscopy;  Laterality: N/A;  10:15  . Esophagogastroduodenoscopy (egd) with esophageal dilation N/A 03/25/2013    Procedure: ESOPHAGOGASTRODUODENOSCOPY (EGD) WITH ESOPHAGEAL DILATION;  Surgeon: Danie Binder, MD;  Location: [facility];  Service: Endoscopy;   Laterality: N/A;   Allergies  Allergen Reactions  . Penicillins Anaphylaxis and Itching  . Codeine Nausea And Vomiting   Current Outpatient Prescriptions  Medication Sig Dispense Refill  . acetaminophen (TYLENOL) 500 MG tablet Take 1,000 mg by mouth every 6 (six) hours as needed for pain.    .      . Iron-FA-B Cmp-C-Biot-Probiotic (FUSION PLUS PO) Take 1 tablet by mouth daily.    .      . Simethicone (GAS-X EXTRA STRENGTH PO) Take 2 tablets by mouth daily as needed. Gas/stomach discomfort     Family History  Problem Relation Age of Onset  . Colon cancer Brother     mid 84s, deceased  . Colon cancer Father     diagnosed at 69, deceased   Review of Systems PER HPI OTHERWISE ALL SYSTEMS ARE NEGATIVE.     Objective:   Physical Exam  Constitutional: She is oriented to person, place, and time. She appears well-developed and well-nourished. No distress.  HENT:  Head: Normocephalic and atraumatic.  Mouth/Throat: Oropharynx is clear and moist. No oropharyngeal exudate.  Eyes: Pupils are equal, round, and reactive to light. No scleral icterus.  Neck: Normal range of motion. Neck supple.  Cardiovascular: Normal rate, regular rhythm and normal heart sounds.   Pulmonary/Chest: Effort normal and breath sounds normal. No respiratory distress.  Abdominal: Soft. Bowel sounds are normal. She exhibits no distension. There is no tenderness.  Musculoskeletal: She exhibits no edema.  Lymphadenopathy:    She has no  cervical adenopathy.  Neurological: She is alert and oriented to person, place, and time.  NO  NEW FOCAL DEFICITS   Psychiatric:  SLIGHTLY DEPRESSED MOOD,  FLAT AFFECT   Vitals reviewed.         Assessment & Plan:

## 2015-02-04 NOTE — Op Note (Addendum)
[facility] [address] [place], [address]   ENDOSCOPY PROCEDURE REPORT  PATIENT: [name], [name]  MR#: [id number] BIRTHDATE: Dec 23, 1960 , 72  yrs. old GENDER: female  ENDOSCOPIST: Danie Binder, MD REFFERED NT:IRWERXV Cindie Laroche, M.D.  PROCEDURE DATE:  2015-03-02 PROCEDURE:   EGD with biopsy and EGD with dilatation over guidewire   INDICATIONS:1.  dysphagia.   2.  abdominal pain. MEDICATIONS: Promethazine (Phenergan) 12.5 mg IV, Demerol 100 mg IV, and Versed 6 mg IV TOPICAL ANESTHETIC: Viscous Xylocaine  DESCRIPTION OF PROCEDURE:   After the risks benefits and alternatives of the procedure were thoroughly explained, informed consent was obtained.  The [id number] ([id number])  endoscope was introduced through the mouth and advanced to the second portion of the duodenum. The instrument was slowly withdrawn as the mucosa was carefully examined.  Prior to withdrawal of the scope, the guidwire was placed.  The esophagus was dilated successfully.  The patient was recovered in endoscopy and discharged home in satisfactory condition. Estimated blood loss is zero unless otherwise noted in this procedure report.   ESOPHAGUS: A stricture was found at the gastroesophageal junction. The stenosis was traversable with the endoscope.   STOMACH: Moderate erosive gastritis (inflammation) was found in the gastric antrum.  Multiple biopsies were performed using cold forceps. DUODENUM: A medium sized diverticulum was found AT THE JUNCTION D1/D2.   The duodenal mucosa showed no abnormalities in the 2nd part of the duodenum.   Dilation was then performed at the gastroesphageal junction Dilator: Savary over guidewire Size(s): 14-17 mm Resistance: moderate Heme: yes Appearance: adequate  COMPLICATIONS: .  PT AGITATED AFTER DEM 75 AND V5.  DOSE LIMITED BY PTS BP IN THE 90s.  ENDOSCOPIC IMPRESSION: 1.   Stricture at the gastroesophageal junction 2.   MODERATE Erosive  gastritis 3.   SINGLE DUODENAL Diverticulum  RECOMMENDATIONS: DRINK WATER. FOLLOW A LOW FAT DIET.  MEATS SHOULD BE BAKED, BROILED, OR BOILED. AVOID FRIED FOODS. LINZESS 30 MINS PRIOR TO BREAKFAST. OMEPRAZOLE 30 MINUTES PRIOR TO FIRST MEAL. AWAIT BIOPSY RESULTS. FOLLOW UP IN NOV 2016.   _______________________________ eSignedDanie Binder, MD March 02, 2015 10:23 AM Revised: March 02, 2015 10:23 AM  CPT CODES: ICD CODES:  The ICD and CPT codes recommended by this software are interpretations from the data that the clinical staff has captured with the software.  The verification of the translation of this report to the ICD and CPT codes and modifiers is the sole responsibility of the health care institution and practicing physician where this report was generated.  [employer] will not be held responsible for the validity of the ICD and CPT codes included on this report.  AMA assumes no liability for data contained or not contained herein. CPT is a Designer, television/film set of the [employer].

## 2015-02-06 ENCOUNTER — Telehealth: Payer: Self-pay | Admitting: Gastroenterology

## 2015-02-06 NOTE — Telephone Encounter (Signed)
Please call pt. HER stomach Bx shows gastritis.    DRINK WATER TO KEEP YOUR URINE LIGHT YELLOW.  FOLLOW A LOW FAT DIET. MEATS SHOULD BE BAKED, BROILED, OR BOILED. AVOID FRIED FOODS.   ADD LINZESS 30 MINS PRIOR TO BREAKFAST. IT MAY CAUSE EXPLOSIVE DIARRHEA.  ADD OMEPRAZOLE. TAKE 30 MINUTES PRIOR TO YOUR FIRST MEAL TO TREAT GASTRITIS AND REFLUX.  CALL IN 2 MOS IF SHE IS STILL HAVING TROUBLE SWALLOWING AND WE CAN STRETCH HER AGAIN.  FOLLOW UP IN NOV 2016 E30 DYSPHAGIA/GERD.

## 2015-02-08 ENCOUNTER — Encounter (HOSPITAL_COMMUNITY): Payer: Self-pay | Admitting: Gastroenterology

## 2015-02-08 NOTE — Telephone Encounter (Signed)
PT called and is aware of results.  

## 2015-02-08 NOTE — Telephone Encounter (Signed)
Vm not set up. Letter mailed to call.

## 2015-02-08 NOTE — Telephone Encounter (Signed)
ON RECALL LIST  °

## 2015-03-17 ENCOUNTER — Encounter: Payer: Self-pay | Admitting: Gastroenterology

## 2015-04-27 IMAGING — US US ABDOMEN COMPLETE
1 series · 13 of 25 positions shown · non-contrast
Comparison: No priors.

CLINICAL DATA: Abdominal pain for the past week, acutely worsening
today.

EXAM:
ULTRASOUND ABDOMEN COMPLETE

[Series 1: us abdomen complete · 0.14mm/px · 13 of 134 slices shown]
[im 1/134]
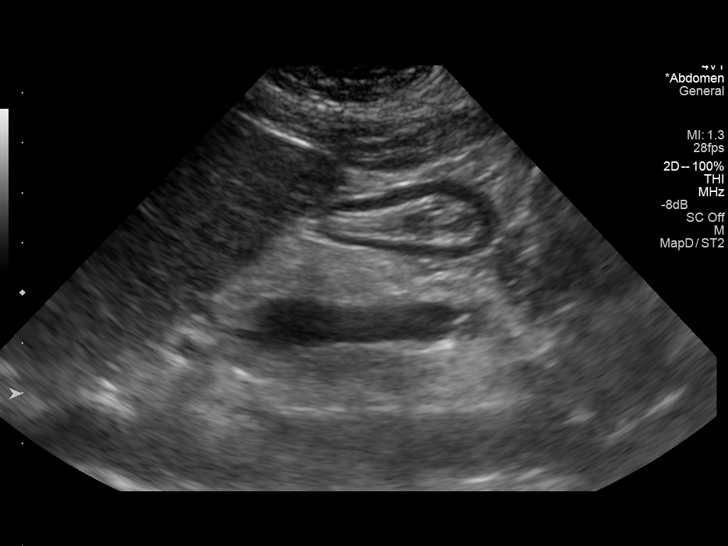
[im 12/134]
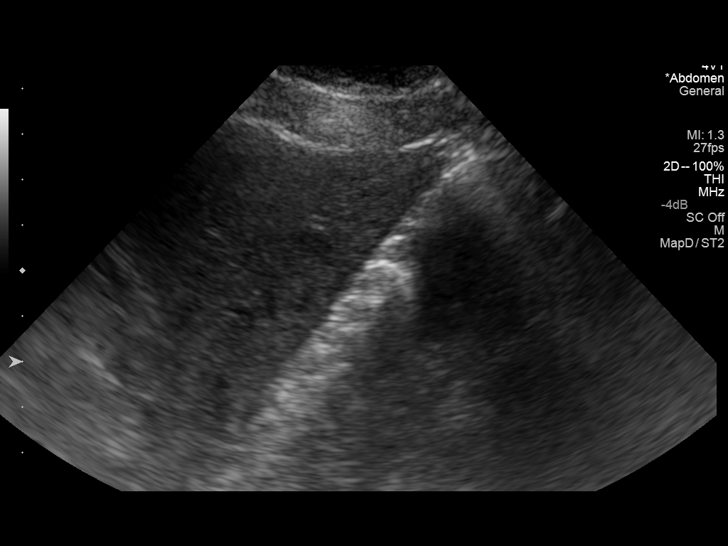
[im 23/134]
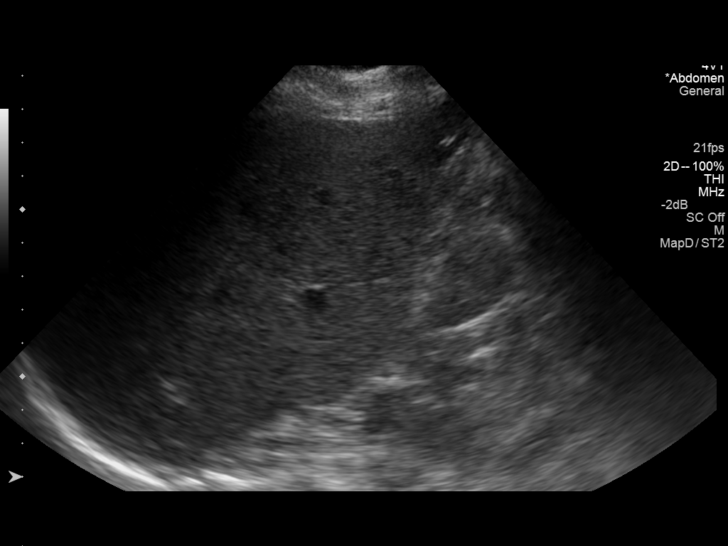
[im 34/134]
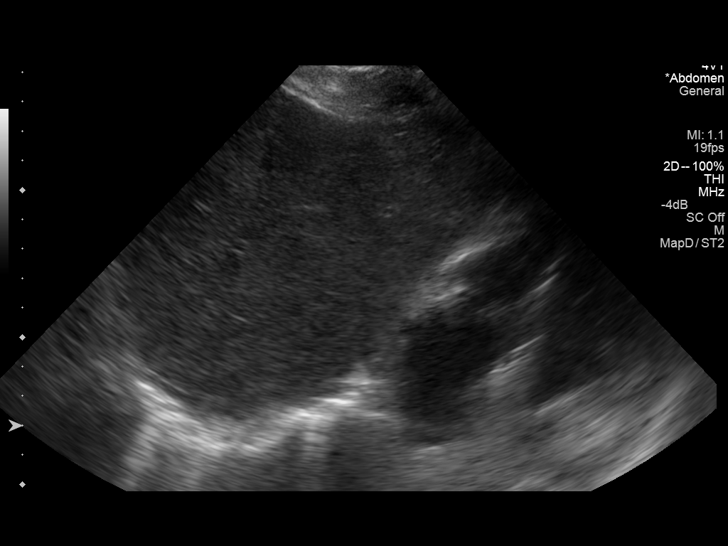
[im 45/134]
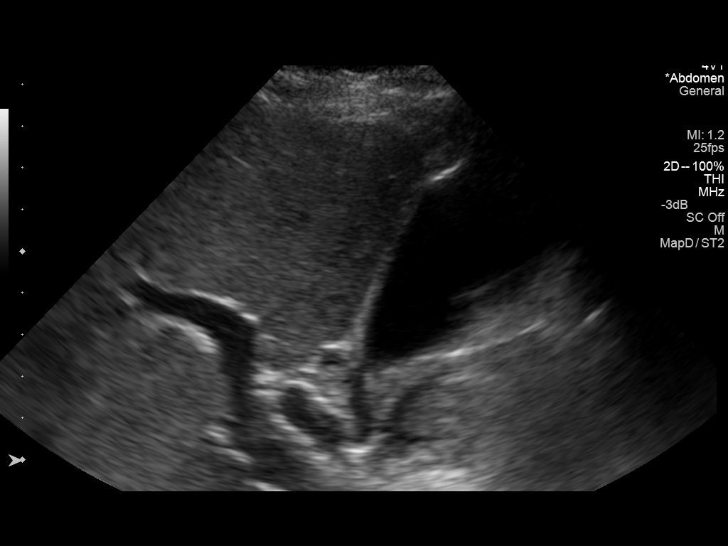
[im 56/134]
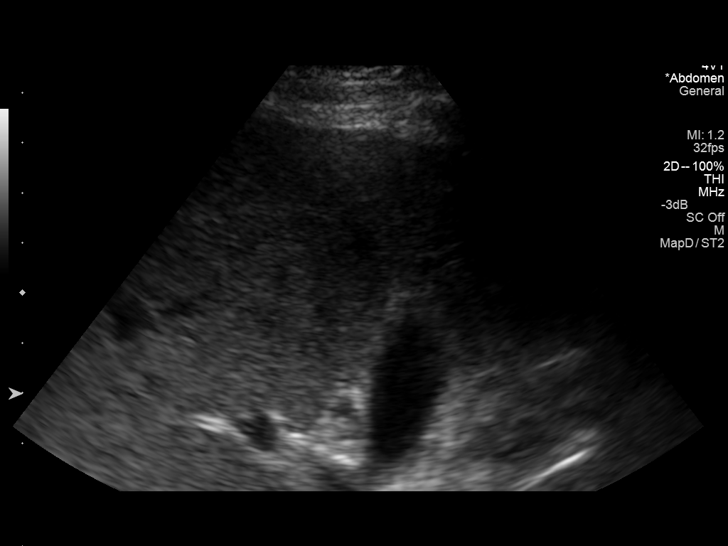
[im 67/134]
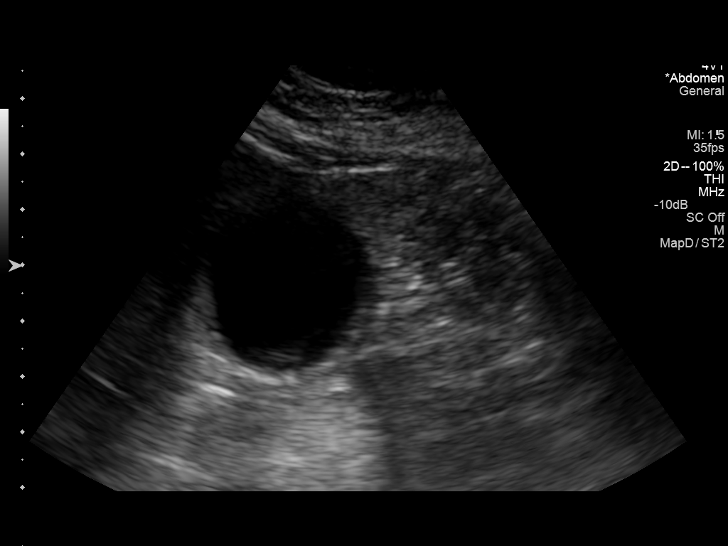
[im 78/134]
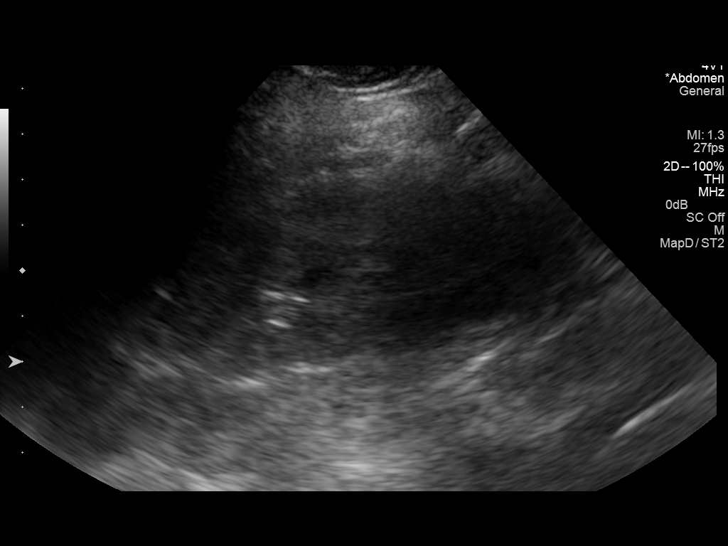
[im 89/134]
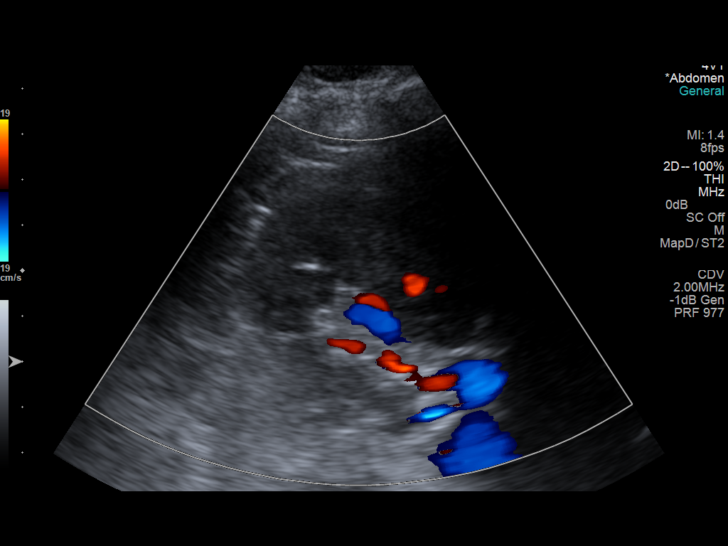
[im 100/134]
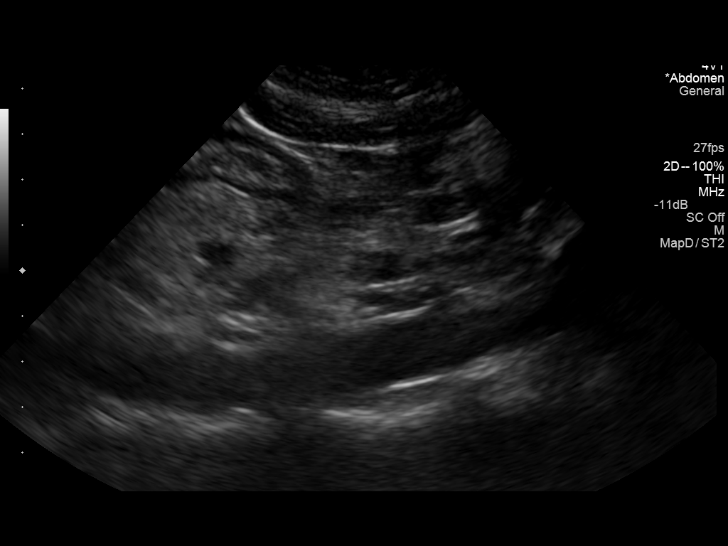
[im 111/134]
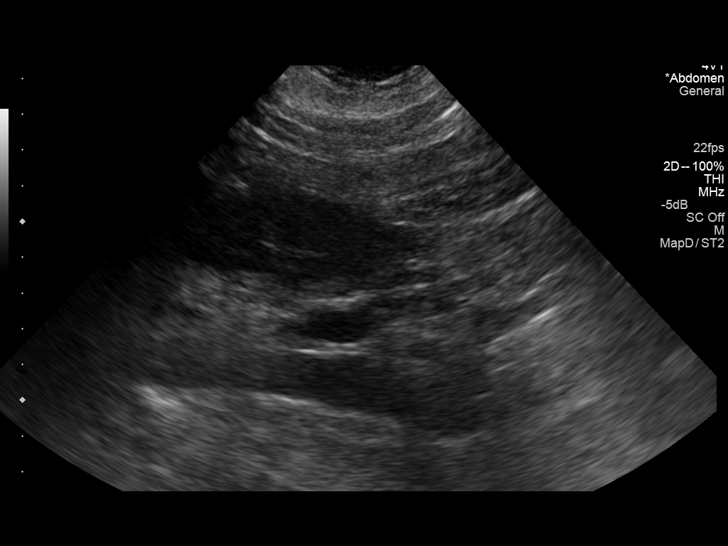
[im 122/134]
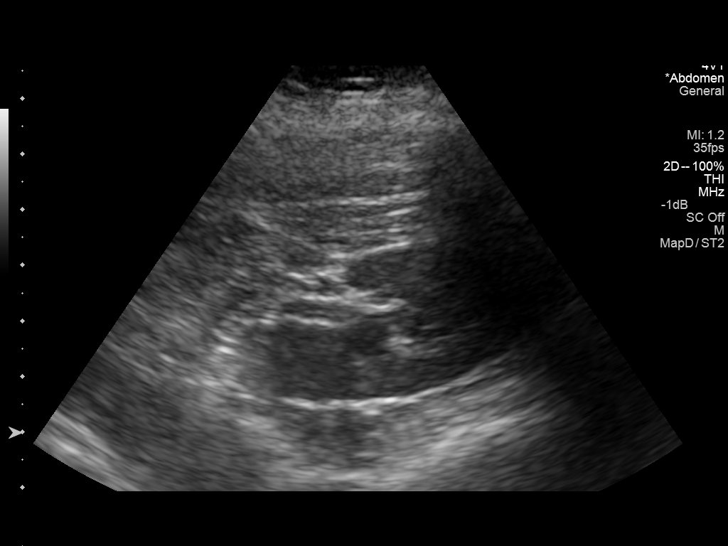
[im 134/134]
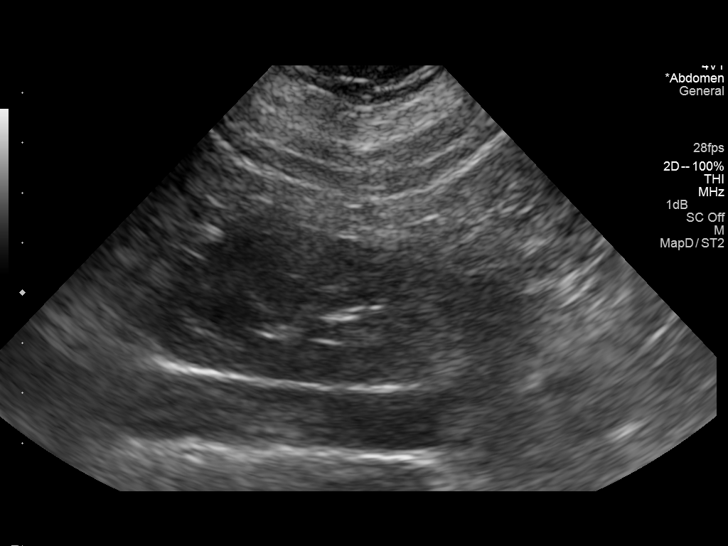

[13 of 25 positions shown; findings below may reference images not displayed]

FINDINGS: Gallbladder:

No gallstones or wall thickening visualized. No sonographic Murphy
sign noted.

Common bile duct:

Diameter: Measures up to 7 mm in the porta hepatis.

Liver:

No focal lesion identified. Within normal limits in parenchymal
echogenicity. No intrahepatic biliary ductal dilatation.

IVC:

No abnormality visualized.

Pancreas:

Visualized portion unremarkable.

Spleen:

Size and appearance within normal limits.  9.5 cm in length.

Horseshoe Kidney:

Right Renal Moiety:

Length: 11.7 cm. Echogenicity within normal limits. No mass or
hydronephrosis visualized.

Left Renal Moiety:

Length: 12.1 cm. Echogenicity within normal limits. No mass or
hydronephrosis visualized.

Abdominal aorta:

No aneurysm visualized.

Other findings:

None.
IMPRESSION: 1. No evidence of cholelithiasis or findings to suggest acute
cholecystitis at this time.
2. Common bile duct is slightly prominent measuring 7 mm within the
porta hepatis. There is no intrahepatic biliary ductal dilatation.
This finding is of uncertain etiology and significance, but
correlation with liver function tests is recommended.
3. Horseshoe kidney redemonstrated.

## 2015-04-27 IMAGING — CR DG CHEST 1V PORT
1 series · 1 of 1 positions shown · non-contrast
Comparison: None.

CLINICAL DATA: Chest pain.  Shortness of breath.  Hypertension.

EXAM:
PORTABLE CHEST - 1 VIEW

[portable]
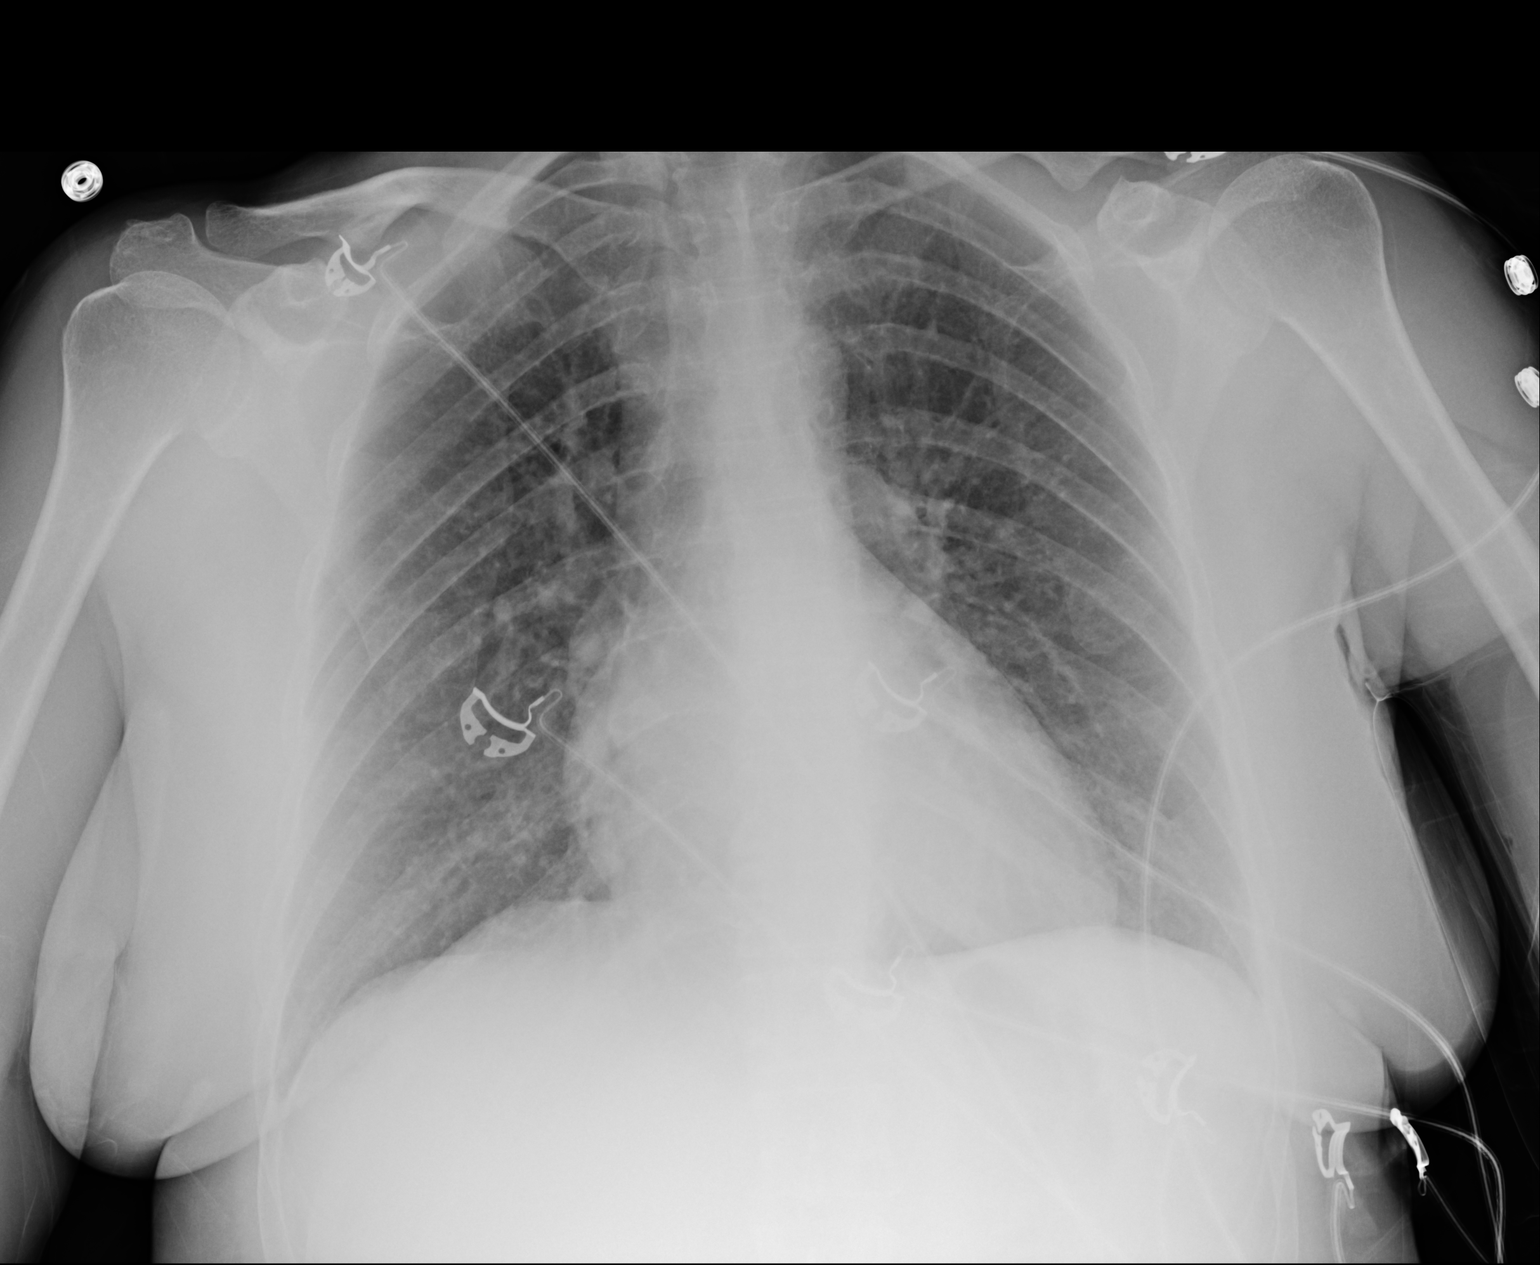

[1 of 1 positions shown; findings below may reference images not displayed]

FINDINGS: The heart size and mediastinal contours are within normal limits.
Both lungs are clear. The visualized skeletal structures are
unremarkable.
IMPRESSION: No acute findings.

## 2016-06-13 ENCOUNTER — Emergency Department (HOSPITAL_COMMUNITY): Payer: Self-pay

## 2016-06-13 ENCOUNTER — Emergency Department (HOSPITAL_COMMUNITY)
Admission: EM | Admit: 2016-06-13 | Discharge: 2016-06-14 | Disposition: A | Discharge status: Home / Self Care | Payer: Self-pay | Attending: Emergency Medicine | Admitting: Emergency Medicine

## 2016-06-13 ENCOUNTER — Encounter (HOSPITAL_COMMUNITY): Payer: Self-pay | Admitting: *Deleted

## 2016-06-13 DIAGNOSIS — Z87891 Personal history of nicotine dependence: Secondary | ICD-10-CM | POA: Insufficient documentation

## 2016-06-13 DIAGNOSIS — R1011 Right upper quadrant pain: Secondary | ICD-10-CM

## 2016-06-13 DIAGNOSIS — R1084 Generalized abdominal pain: Secondary | ICD-10-CM | POA: Insufficient documentation

## 2016-06-13 DIAGNOSIS — Z79899 Other long term (current) drug therapy: Secondary | ICD-10-CM | POA: Insufficient documentation

## 2016-06-13 DIAGNOSIS — R197 Diarrhea, unspecified: Secondary | ICD-10-CM

## 2016-06-13 DIAGNOSIS — E860 Dehydration: Secondary | ICD-10-CM | POA: Insufficient documentation

## 2016-06-13 DIAGNOSIS — E876 Hypokalemia: Secondary | ICD-10-CM | POA: Insufficient documentation

## 2016-06-13 DIAGNOSIS — R112 Nausea with vomiting, unspecified: Secondary | ICD-10-CM

## 2016-06-13 LAB — COMPREHENSIVE METABOLIC PANEL
ALBUMIN: 4.5 g/dL (ref 3.5–5.0)
ALT: 19 U/L (ref 14–54)
ANION GAP: 11 (ref 5–15)
AST: 27 U/L (ref 15–41)
Alkaline Phosphatase: 83 U/L (ref 38–126)
BUN: 9 mg/dL (ref 6–20)
CO2: 22 mmol/L (ref 22–32)
Calcium: 10 mg/dL (ref 8.9–10.3)
Chloride: 99 mmol/L — ABNORMAL LOW (ref 101–111)
Creatinine, Ser: 0.78 mg/dL (ref 0.44–1.00)
GFR calc Af Amer: >60 mL/min (ref >60)
GFR calc non Af Amer: >60 mL/min (ref >60)
Glucose, Bld: 155 mg/dL — ABNORMAL HIGH (ref 65–99)
POTASSIUM: 2.7 mmol/L — AB (ref 3.5–5.1)
Sodium: 132 mmol/L — ABNORMAL LOW (ref 135–145)
TOTAL PROTEIN: 8 g/dL (ref 6.5–8.1)
Total Bilirubin: 0.8 mg/dL (ref 0.3–1.2)

## 2016-06-13 LAB — CBC
HCT: 43.7 % (ref 36.0–46.0)
Hemoglobin: 15.5 g/dL — ABNORMAL HIGH (ref 12.0–15.0)
MCH: 30.5 pg (ref 26.0–34.0)
MCHC: 35.5 g/dL (ref 30.0–36.0)
MCV: 86 fL (ref 78.0–100.0)
Platelets: 159 10*3/uL (ref 150–400)
RBC: 5.08 MIL/uL (ref 3.87–5.11)
RDW: 14.2 % (ref 11.5–15.5)
WBC: 7.8 10*3/uL (ref 4.0–10.5)

## 2016-06-13 LAB — URINALYSIS, ROUTINE W REFLEX MICROSCOPIC
GLUCOSE, UA: NEGATIVE mg/dL
HGB URINE DIPSTICK: NEGATIVE
Ketones, ur: NEGATIVE mg/dL
Leukocytes, UA: NEGATIVE
Nitrite: NEGATIVE
Protein, ur: NEGATIVE mg/dL
SPECIFIC GRAVITY, URINE: 1.02 (ref 1.005–1.030)
pH: 6 (ref 5.0–8.0)

## 2016-06-13 LAB — LIPASE, BLOOD: Lipase: 23 U/L (ref 11–51)

## 2016-06-13 LAB — TROPONIN I: Troponin I: <0.03 ng/mL (ref <0.03)

## 2016-06-13 MED ORDER — POTASSIUM CHLORIDE CRYS ER 20 MEQ PO TBCR: 20.0000 meq | EXTENDED_RELEASE_TABLET | Freq: Two times a day (BID) | ORAL | 0 refills | Status: DC

## 2016-06-13 MED ORDER — ONDANSETRON HCL 4 MG/2ML IJ SOLN
4.0000 mg | Freq: Once | INTRAMUSCULAR | Status: AC
  Administered 2016-06-13: 4 mg via INTRAVENOUS
  Filled 2016-06-13: qty 2

## 2016-06-13 MED ORDER — LACTATED RINGERS IV BOLUS (SEPSIS)
1000.0000 mL | Freq: Once | INTRAVENOUS | Status: AC
  Administered 2016-06-13: 1000 mL via INTRAVENOUS

## 2016-06-13 MED ORDER — POTASSIUM CHLORIDE 10 MEQ/100ML IV SOLN
INTRAVENOUS | Status: AC
  Filled 2016-06-13: qty 100

## 2016-06-13 MED ORDER — DIPHENHYDRAMINE HCL 50 MG/ML IJ SOLN
25.0000 mg | Freq: Once | INTRAMUSCULAR | Status: AC
  Administered 2016-06-14: 25 mg via INTRAVENOUS
  Filled 2016-06-13: qty 1

## 2016-06-13 MED ORDER — ONDANSETRON HCL 4 MG PO TABS: 4.0000 mg | ORAL_TABLET | Freq: Three times a day (TID) | ORAL | 0 refills | Status: DC | PRN

## 2016-06-13 MED ORDER — POTASSIUM CHLORIDE CRYS ER 20 MEQ PO TBCR
40.0000 meq | EXTENDED_RELEASE_TABLET | Freq: Once | ORAL | Status: AC
  Administered 2016-06-13: 40 meq via ORAL
  Filled 2016-06-13: qty 2

## 2016-06-13 MED ORDER — IOPAMIDOL (ISOVUE-300) INJECTION 61%
INTRAVENOUS | Status: AC
  Filled 2016-06-13: qty 30

## 2016-06-13 MED ORDER — PROMETHAZINE HCL 25 MG RE SUPP: 25.0000 mg | Freq: Four times a day (QID) | RECTAL | 0 refills | Status: DC | PRN

## 2016-06-13 MED ORDER — ONDANSETRON HCL 4 MG/2ML IJ SOLN
4.0000 mg | Freq: Once | INTRAMUSCULAR | Status: AC
  Administered 2016-06-14: 4 mg via INTRAVENOUS
  Filled 2016-06-13: qty 2

## 2016-06-13 MED ORDER — POTASSIUM CHLORIDE 10 MEQ/100ML IV SOLN
10.0000 meq | INTRAVENOUS | Status: AC
  Administered 2016-06-13 (×3): 10 meq via INTRAVENOUS
  Filled 2016-06-13: qty 100

## 2016-06-13 MED ORDER — GI COCKTAIL ~~LOC~~
30.0000 mL | Freq: Once | ORAL | Status: AC
  Administered 2016-06-13: 30 mL via ORAL
  Filled 2016-06-13: qty 30

## 2016-06-13 MED ORDER — DICYCLOMINE HCL 20 MG PO TABS: 20.0000 mg | ORAL_TABLET | Freq: Three times a day (TID) | ORAL | 0 refills | Status: DC

## 2016-06-13 MED ORDER — MORPHINE SULFATE (PF) 4 MG/ML IV SOLN
4.0000 mg | Freq: Once | INTRAVENOUS | Status: AC
  Administered 2016-06-13: 4 mg via INTRAVENOUS
  Filled 2016-06-13: qty 1

## 2016-06-13 MED ORDER — IOPAMIDOL (ISOVUE-300) INJECTION 61%
100.0000 mL | Freq: Once | INTRAVENOUS | Status: AC | PRN
  Administered 2016-06-13: 100 mL via INTRAVENOUS

## 2016-06-13 MED ORDER — DICYCLOMINE HCL 10 MG PO CAPS
20.0000 mg | ORAL_CAPSULE | Freq: Once | ORAL | Status: AC
  Administered 2016-06-13: 20 mg via ORAL
  Filled 2016-06-13: qty 2

## 2016-06-13 MED ORDER — MAGNESIUM SULFATE 2 GM/50ML IV SOLN
2.0000 g | Freq: Once | INTRAVENOUS | Status: AC
  Administered 2016-06-13: 2 g via INTRAVENOUS
  Filled 2016-06-13: qty 50

## 2016-06-13 MED ORDER — METOCLOPRAMIDE HCL 5 MG/ML IJ SOLN
10.0000 mg | Freq: Once | INTRAMUSCULAR | Status: AC
  Administered 2016-06-14: 10 mg via INTRAVENOUS
  Filled 2016-06-13: qty 2

## 2016-06-13 NOTE — ED Provider Notes (Signed)
[facility] DEPT Provider Note   CSN: [id number] Arrival date & time: 06/13/16  1449     History   Chief Complaint Chief Complaint  Patient presents with  . Chest Pain  . Emesis    HPI [name] is a 55 y.o. female.  HPI 55 year old female with past medical history as below who presents with persistent nausea, vomiting, and diarrhea. The patient's symptoms started approximately 5 days ago with mild epigastric pain. Over the next 24 hours, she developed chills, nonbloody and nonbilious emesis, as well as watery diarrhea. She's also had intermittent lightheadedness and dizziness over the last several days. She states that over the last 24 hours, she has had increasing difficulty keeping anything solid down has been able to tolerate small amounts of liquid only. She is also developed persistently worsening diffuse and primarily epigastric pain. She shows a pain as an aching, cramp-like sensation. She also has a history of irritable bowel but states this is more severe than her usual symptoms. Denies any blood in her emesis or diarrhea. Denies any alleviating factors. No known sick contacts.  Past Medical History:  Diagnosis Date  . DDD (degenerative disc disease)    spinal injections  . DDD (degenerative disc disease), lumbar   . Diverticular disease   . Horseshoe kidney   . Hypercholesterolemia   . Irritable bowel syndrome     Patient Active Problem List   Diagnosis Date Noted  . Dysphagia, pharyngoesophageal phase   . Dysphagia 03/07/2013  . ACUTE SINUSITIS, UNSPECIFIED 06/29/2009  . GERD, SEVERE 06/29/2009  . Irritable bowel syndrome 06/29/2009  . Hemorrhoids, internal 06/29/2009    Past Surgical History:  Procedure Laterality Date  . cervical dysplasia    . COLONOSCOPY  07/01/2009   [id number] seen in the ascending and sigmoid colon/small internal hemorrhoids/6-mm sessile polyp/4-mm sessile polyp.3-mm sessile polyp removed. simple adenomas  .  COLONOSCOPY N/A 03/25/2013   Procedure: COLONOSCOPY;  Surgeon: Danie Binder, MD;  Location: [facility];  Service: Endoscopy;  Laterality: N/A;  10:15  . ESOPHAGEAL DILATION N/A 02/04/2015   Procedure: ESOPHAGEAL DILATION;  Surgeon: Danie Binder, MD;  Location: [facility];  Service: Endoscopy;  Laterality: N/A;  . ESOPHAGOGASTRODUODENOSCOPY  07/01/2009   [id number] esophagus/dilation to 16 mm possible proximal cervical web  . ESOPHAGOGASTRODUODENOSCOPY N/A 02/04/2015   Procedure: ESOPHAGOGASTRODUODENOSCOPY (EGD);  Surgeon: Danie Binder, MD;  Location: [facility];  Service: Endoscopy;  Laterality: N/A;  0800-moved to 845 Office to notify pt  . ESOPHAGOGASTRODUODENOSCOPY (EGD) WITH ESOPHAGEAL DILATION N/A 03/25/2013   Procedure: ESOPHAGOGASTRODUODENOSCOPY (EGD) WITH ESOPHAGEAL DILATION;  Surgeon: Danie Binder, MD;  Location: [facility];  Service: Endoscopy;  Laterality: N/A;  . TONSILLECTOMY    . TUBAL LIGATION    . WRIST SURGERY      OB History    No data available       Home Medications    Prior to Admission medications   Medication Sig Start Date End Date Taking? Authorizing Provider  acetaminophen (TYLENOL) 500 MG tablet Take 500 mg by mouth every 6 (six) hours as needed for mild pain or moderate pain.   Yes Historical Provider, MD  naproxen (NAPROSYN) 500 MG tablet Take 500 mg by mouth 2 (two) times daily.   Yes Historical Provider, MD  dicyclomine (BENTYL) 20 MG tablet Take 1 tablet (20 mg total) by mouth 4 (four) times daily -  before meals and at bedtime. 06/13/16   Duffy Bruce, MD  ondansetron (  ZOFRAN) 4 MG tablet Take 1 tablet (4 mg total) by mouth every 8 (eight) hours as needed for nausea or vomiting. 06/13/16   Duffy Bruce, MD  potassium chloride SA (K-DUR,KLOR-CON) 20 MEQ tablet Take 1 tablet (20 mEq total) by mouth 2 (two) times daily. 06/13/16 06/17/16  Duffy Bruce, MD  promethazine (PHENERGAN) 25 MG suppository Place 1 suppository (25 mg total)  rectally every 6 (six) hours as needed for refractory nausea / vomiting. 06/13/16   Duffy Bruce, MD    Family History Family History  Problem Relation Age of Onset  . Colon cancer Brother     mid 68s, deceased  . Colon cancer Father     diagnosed at 65, deceased    Social History Social History  Substance Use Topics  . Smoking status: Former Research scientist (life sciences)  . Smokeless tobacco: Never Used  . Alcohol use No     Allergies   Penicillins and Codeine   Review of Systems Review of Systems  Constitutional: Positive for fatigue. Negative for chills and fever.  HENT: Negative for congestion, rhinorrhea and sore throat.   Eyes: Negative for visual disturbance.  Respiratory: Negative for cough, shortness of breath and wheezing.   Cardiovascular: Negative for chest pain and leg swelling.  Gastrointestinal: Positive for abdominal pain, diarrhea, nausea and vomiting.  Genitourinary: Negative for dysuria, flank pain, vaginal bleeding and vaginal discharge.  Musculoskeletal: Negative for neck pain.  Skin: Negative for rash.  Allergic/Immunologic: Negative for immunocompromised state.  Neurological: Negative for syncope and headaches.  Hematological: Does not bruise/bleed easily.  All other systems reviewed and are negative.    Physical Exam Updated Vital Signs BP 133/68 (BP Location: Left Arm)   Pulse 85   Temp 98.3 F (36.8 C) (Oral)   Resp 20   Ht 5\' 2"  (1.575 m)   Wt 150 lb (68 kg)   SpO2 100%   BMI 27.44 kg/m   Physical Exam  Constitutional: She is oriented to person, place, and time. She appears well-developed and well-nourished. No distress.  HENT:  Head: Normocephalic and atraumatic.  Eyes: Conjunctivae are normal. Pupils are equal, round, and reactive to light.  Neck: Neck supple.  Cardiovascular: Normal rate, regular rhythm and normal heart sounds.  Exam reveals no friction rub.   No murmur heard. Pulmonary/Chest: Effort normal and breath sounds normal. No  respiratory distress. She has no wheezes. She has no rales.  Abdominal: Soft. Bowel sounds are normal. She exhibits no distension. There is tenderness (Generalized). There is no rebound and no guarding.  Musculoskeletal: She exhibits no edema.  Neurological: She is alert and oriented to person, place, and time. She exhibits normal muscle tone.  Skin: Skin is warm. Capillary refill takes less than 2 seconds. No rash noted.  Psychiatric: She has a normal mood and affect.  Nursing note and vitals reviewed.    ED Treatments / Results  Labs (all labs ordered are listed, but only abnormal results are displayed) Labs Reviewed  CBC - Abnormal; Notable for the following:       Result Value   Hemoglobin 15.5 (*)    All other components within normal limits  COMPREHENSIVE METABOLIC PANEL - Abnormal; Notable for the following:    Sodium 132 (*)    Potassium 2.7 (*)    Chloride 99 (*)    Glucose, Bld 155 (*)    All other components within normal limits  URINALYSIS, ROUTINE W REFLEX MICROSCOPIC - Abnormal; Notable for the following:    Bilirubin  Urine SMALL (*)    All other components within normal limits  GASTROINTESTINAL PANEL BY PCR, STOOL (REPLACES STOOL CULTURE)  TROPONIN I  LIPASE, BLOOD    EKG  EKG Interpretation None       Radiology Dg Chest 2 View  Result Date: 06/13/2016 CLINICAL DATA:  Left-sided chest pain, vomiting, diarrhea, shortness of breath, former smoking history EXAM: CHEST  2 VIEW COMPARISON:  Portable chest x-ray of 12/13/2013 and images through the lung bases on CT abdomen pelvis of 09/22/2014 FINDINGS: No active infiltrate or effusion is seen. The lungs are slightly hyperaerated. On the lateral view there is a questionable subtle nodular opacity overlying the lower thoracic spine. This could be due to overlapping vessels and bony structures, but attention to this area on followup chest x-ray is recommended. In reviewing this area on CT of the abdomen pelvis with  images through the lung bases, this nodule was not seen on CT of 09/22/2014. If this area persists on followup chest x-ray then a CT of the chest may be warranted. Mediastinal and hilar contours are unremarkable. The heart is within normal limits in size. No acute bony abnormality is seen. IMPRESSION: 1. No pneumonia or effusion.  Slight hyper aeration. 2. Vague nodular opacity posteriorly at the lung base on the lateral view may be due to overlapping vascular and bony structures but recommend attention to this area on followup chest x-ray. Electronically Signed   By: Ivar Drape M.D.   On: 06/13/2016 15:59   Ct Abdomen Pelvis W Contrast  Result Date: 06/13/2016 CLINICAL DATA:  55 y/o  F; mid chest and abdomen pain. EXAM: CT ABDOMEN AND PELVIS WITH CONTRAST TECHNIQUE: Multidetector CT imaging of the abdomen and pelvis was performed using the standard protocol following bolus administration of intravenous contrast. CONTRAST:  130mL ISOVUE-300 IOPAMIDOL (ISOVUE-300) INJECTION 61% COMPARISON:  09/22/2014 CT abdomen and pelvis. FINDINGS: Lower chest: Stable 3-4 mm peripheral nodules from 2016, no additional follow-up necessary. No acute process. Hepatobiliary: No focal liver abnormality is seen. No gallstones, gallbladder wall thickening, or biliary dilatation. Pancreas: Single punctate calcification within the pancreatic neck may represent sequelae of prior pancreatitis. No pancreatic ductal dilatation or surrounding inflammatory changes. Spleen: Normal in size without focal abnormality. Adrenals/Urinary Tract: Left adrenal nodule measuring 8 mm and 15 HU consistent with adenoma is stable. Horseshoe kidney. No obstructive uropathy. Normal bladder. Stomach/Bowel: Duodenum diverticulum direct anteriorly and medially from first part of duodenum measuring 27 x 21 x 26 mm (AP x ML x CC) series 2, image 30 and series 4, image 30. Mildly increased in size from prior CT. No appreciable associated inflammatory changes. No  obstructive or inflammatory changes of the bowel. Normal appendix. Moderate sigmoid diverticulosis without evidence for diverticulitis. Vascular/Lymphatic: No significant vascular findings are present. No enlarged abdominal or pelvic lymph nodes. Reproductive: Uterus and bilateral adnexa are unremarkable. Other: No abdominal wall hernia or abnormality. No abdominopelvic ascites. Musculoskeletal: Degenerative changes of the spine greatest at the L5-S1 level where there is severe disc space narrowing and minimal retrolisthesis. Lower lumbar facet arthropathy. No acute osseous abnormality is evident. IMPRESSION: 1. Duodenum diverticulum of first part of duodenum mildly increased in size from prior CT. No associated inflammatory changes identified. 2. Moderate sigmoid diverticulosis without evidence for diverticulitis. 3. Horseshoe kidney. 4. Stable left adrenal adenoma. 5. Degenerative changes of lumbar spine greatest at L5-S1. Electronically Signed   By: Kristine Garbe M.D.   On: 06/13/2016 22:40    Procedures Procedures (including critical care  time)  Medications Ordered in ED Medications  potassium chloride 10 mEq in 100 mL IVPB (10 mEq Intravenous New Bag/Given 06/13/16 2344)  iopamidol (ISOVUE-300) 61 % injection (not administered)  ondansetron (ZOFRAN) injection 4 mg (4 mg Intravenous Given 06/13/16 2107)  lactated ringers bolus 1,000 mL (1,000 mLs Intravenous New Bag/Given 06/13/16 2108)  magnesium sulfate IVPB 2 g 50 mL (2 g Intravenous New Bag/Given 06/13/16 2109)  lactated ringers bolus 1,000 mL (1,000 mLs Intravenous New Bag/Given 06/13/16 2108)  morphine 4 MG/ML injection 4 mg (4 mg Intravenous Given 06/13/16 2106)  iopamidol (ISOVUE-300) 61 % injection 100 mL (100 mLs Intravenous Contrast Given 06/13/16 2133)  gi cocktail (Maalox,Lidocaine,Donnatal) (30 mLs Oral Given 06/13/16 2344)  ondansetron (ZOFRAN) injection 4 mg (4 mg Intravenous Given 06/14/16 0015)  dicyclomine (BENTYL) capsule  20 mg (20 mg Oral Given 06/13/16 2344)  potassium chloride SA (K-DUR,KLOR-CON) CR tablet 40 mEq (40 mEq Oral Given 06/13/16 2344)  metoCLOPramide (REGLAN) injection 10 mg (10 mg Intravenous Given 06/14/16 0016)  diphenhydrAMINE (BENADRYL) injection 25 mg (25 mg Intravenous Given 06/14/16 0015)  loperamide (IMODIUM) capsule 4 mg (4 mg Oral Given 06/14/16 0020)     Initial Impression / Assessment and Plan / ED Course  I have reviewed the triage vital signs and the nursing notes.  Pertinent labs & imaging results that were available during my care of the patient were reviewed by me and considered in my medical decision making (see chart for details).  Clinical Course     55 yo F with PMHx as above here with n/v/d x 4-5 days. On arrival, VSS and WNL. Exam is as above. Pt with diffuse abdominal TTP but no guarding. Non-toxic on exam. Labs, imaging obtained as above. Primary suspicion is likely viral GI illness. Labs show normal CBC, pt afebrile without signs of bacterial infection or sepsis. CMP remarkable for hypokalemia, likely 2/2 GI losses, but normal CO2, normal AG, and baseline renal function. UA without significant ketonuria or dehydration. CT scan negative for acute surgical abnormality. Plan to give IVF, IV K, mag, and re-assess. If pt able to tolerate PO and remains HDS and well appearing, can likely continue sx control at home. Will d/c with zofran, phenergan supp PRN, potassium, and lab re-check in 48 hrs. GI pathogen panel ordered for outpt f/u.  Final Clinical Impressions(s) / ED Diagnoses   Final diagnoses:  RUQ pain  Nausea vomiting and diarrhea  Hypokalemia  Dehydration    New Prescriptions New Prescriptions   DICYCLOMINE (BENTYL) 20 MG TABLET    Take 1 tablet (20 mg total) by mouth 4 (four) times daily -  before meals and at bedtime.   ONDANSETRON (ZOFRAN) 4 MG TABLET    Take 1 tablet (4 mg total) by mouth every 8 (eight) hours as needed for nausea or vomiting.   POTASSIUM  CHLORIDE SA (K-DUR,KLOR-CON) 20 MEQ TABLET    Take 1 tablet (20 mEq total) by mouth 2 (two) times daily.   PROMETHAZINE (PHENERGAN) 25 MG SUPPOSITORY    Place 1 suppository (25 mg total) rectally every 6 (six) hours as needed for refractory nausea / vomiting.     Duffy Bruce, MD 06/14/16 [phone]

## 2016-06-13 NOTE — ED Notes (Signed)
CRITICAL VALUE ALERT  Critical value received:  Potassium 2.7  Date of notification:  06/13/16  Time of notification:  [id number]  Critical value read back:YES  Nurse who received alert:  Norm Salt, RN  MD notified (1st page):  Dr. Lita Mains at [phone]

## 2016-06-13 NOTE — ED Triage Notes (Signed)
Pt c/o mid chest pain, epigastric pain, mid abdominal pain, nausea, diarrhea, vomiting, chills that started last Thursday evening. Reports intermittent dizziness/lightheadedness. Pt reports she is unable to keep anything down PO.

## 2016-06-14 LAB — C DIFFICILE QUICK SCREEN W PCR REFLEX
C DIFFICILE (CDIFF) INTERP: NOT DETECTED
C DIFFICILE (CDIFF) TOXIN: NEGATIVE
C Diff antigen: NEGATIVE

## 2016-06-14 MED ORDER — LOPERAMIDE HCL 2 MG PO CAPS
4.0000 mg | ORAL_CAPSULE | Freq: Once | ORAL | Status: AC
  Administered 2016-06-14: 4 mg via ORAL
  Filled 2016-06-14: qty 2

## 2016-06-15 LAB — GASTROINTESTINAL PANEL BY PCR, STOOL (REPLACES STOOL CULTURE)
ADENOVIRUS F40/41: NOT DETECTED
ASTROVIRUS: DETECTED — AB
Campylobacter species: NOT DETECTED
Cryptosporidium: NOT DETECTED
Cyclospora cayetanensis: NOT DETECTED
ENTAMOEBA HISTOLYTICA: NOT DETECTED
ENTEROAGGREGATIVE E COLI (EAEC): NOT DETECTED
ENTEROPATHOGENIC E COLI (EPEC): NOT DETECTED
ENTEROTOXIGENIC E COLI (ETEC): NOT DETECTED
GIARDIA LAMBLIA: NOT DETECTED
NOROVIRUS GI/GII: NOT DETECTED
Plesimonas shigelloides: NOT DETECTED
ROTAVIRUS A: NOT DETECTED
SALMONELLA SPECIES: NOT DETECTED
SHIGELLA/ENTEROINVASIVE E COLI (EIEC): NOT DETECTED
Sapovirus (I, II, IV, and V): NOT DETECTED
Shiga like toxin producing E coli (STEC): NOT DETECTED
VIBRIO CHOLERAE: NOT DETECTED
Vibrio species: NOT DETECTED
Yersinia enterocolitica: NOT DETECTED

## 2017-02-24 ENCOUNTER — Emergency Department (HOSPITAL_COMMUNITY)
Admission: EM | Admit: 2017-02-24 | Discharge: 2017-02-24 | Disposition: A | Discharge status: Home / Self Care | Payer: Self-pay | Attending: Emergency Medicine | Admitting: Emergency Medicine

## 2017-02-24 ENCOUNTER — Encounter (HOSPITAL_COMMUNITY): Payer: Self-pay | Admitting: *Deleted

## 2017-02-24 DIAGNOSIS — Y929 Unspecified place or not applicable: Secondary | ICD-10-CM | POA: Insufficient documentation

## 2017-02-24 DIAGNOSIS — W230XXA Caught, crushed, jammed, or pinched between moving objects, initial encounter: Secondary | ICD-10-CM | POA: Insufficient documentation

## 2017-02-24 DIAGNOSIS — Y939 Activity, unspecified: Secondary | ICD-10-CM | POA: Insufficient documentation

## 2017-02-24 DIAGNOSIS — Z79899 Other long term (current) drug therapy: Secondary | ICD-10-CM | POA: Insufficient documentation

## 2017-02-24 DIAGNOSIS — S060X0A Concussion without loss of consciousness, initial encounter: Secondary | ICD-10-CM | POA: Insufficient documentation

## 2017-02-24 DIAGNOSIS — Z87891 Personal history of nicotine dependence: Secondary | ICD-10-CM | POA: Insufficient documentation

## 2017-02-24 DIAGNOSIS — S0990XA Unspecified injury of head, initial encounter: Secondary | ICD-10-CM

## 2017-02-24 DIAGNOSIS — Y999 Unspecified external cause status: Secondary | ICD-10-CM | POA: Insufficient documentation

## 2017-02-24 MED ORDER — ONDANSETRON 4 MG PO TBDP: 4.0000 mg | ORAL_TABLET | Freq: Three times a day (TID) | ORAL | 0 refills | Status: DC | PRN

## 2017-02-24 MED ORDER — KETOROLAC TROMETHAMINE 60 MG/2ML IM SOLN
60.0000 mg | Freq: Once | INTRAMUSCULAR | Status: AC
  Administered 2017-02-24: 60 mg via INTRAMUSCULAR
  Filled 2017-02-24: qty 2

## 2017-02-24 MED ORDER — ONDANSETRON 4 MG PO TBDP
4.0000 mg | ORAL_TABLET | Freq: Once | ORAL | Status: AC
  Administered 2017-02-24: 4 mg via ORAL
  Filled 2017-02-24: qty 1

## 2017-02-24 MED ORDER — IBUPROFEN 800 MG PO TABS: 800.0000 mg | ORAL_TABLET | Freq: Three times a day (TID) | ORAL | 0 refills | Status: DC

## 2017-02-24 NOTE — ED Notes (Signed)
No dizziness, denies LOC and vision normal
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
## 2017-02-24 NOTE — Discharge Instructions (Signed)

## 2017-02-27 NOTE — ED Notes (Signed)
Jimmey Ralph, at [facility] called and reports pt requesting something cheaper than zofran for nausea.  Notified Dr. Oleta Mouse and prescription changed to phenergan 25mg  po every 6 hours prn n/v dispense 30.

## 2017-07-05 ENCOUNTER — Other Ambulatory Visit (HOSPITAL_COMMUNITY): Payer: Self-pay | Admitting: Family Medicine

## 2017-07-05 ENCOUNTER — Ambulatory Visit (HOSPITAL_COMMUNITY)
Admission: RE | Admit: 2017-07-05 | Discharge: 2017-07-05 | Disposition: A | Discharge status: Home / Self Care | Payer: Self-pay | Source: Ambulatory Visit | Attending: Family Medicine | Admitting: Family Medicine

## 2017-07-05 DIAGNOSIS — T1490XA Injury, unspecified, initial encounter: Secondary | ICD-10-CM

## 2017-07-05 DIAGNOSIS — X58XXXA Exposure to other specified factors, initial encounter: Secondary | ICD-10-CM | POA: Insufficient documentation

## 2017-07-05 DIAGNOSIS — S299XXA Unspecified injury of thorax, initial encounter: Secondary | ICD-10-CM | POA: Insufficient documentation

## 2017-10-26 IMAGING — DX DG CHEST 2V
2 series · 2 of 2 positions shown · non-contrast
Comparison: Portable chest x-ray of 12/13/2013 and images through
the lung bases on CT abdomen pelvis of 09/22/2014

CLINICAL DATA: Left-sided chest pain, vomiting, diarrhea, shortness
of breath, former smoking history

EXAM:
CHEST  2 VIEW

[chest pa]
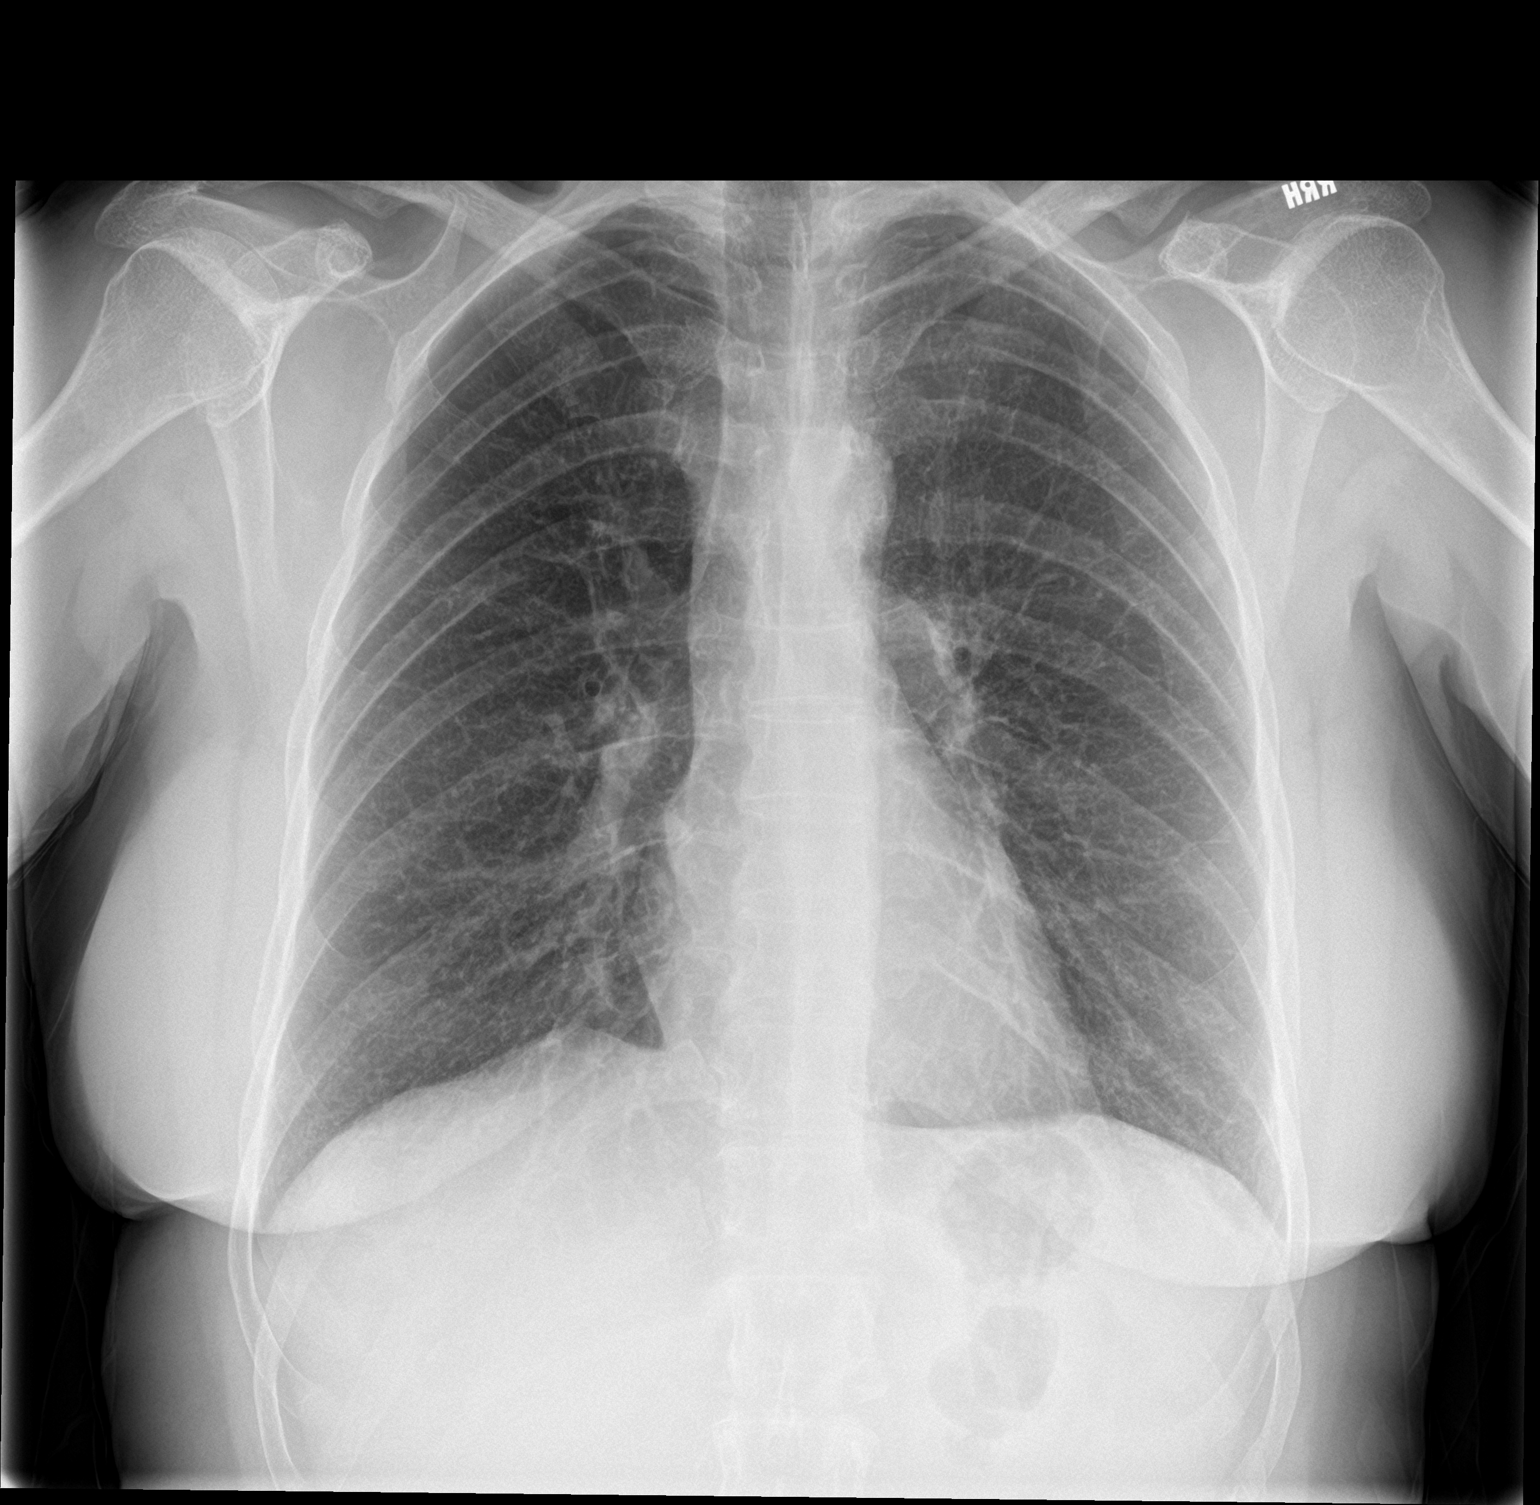

[chest lat]
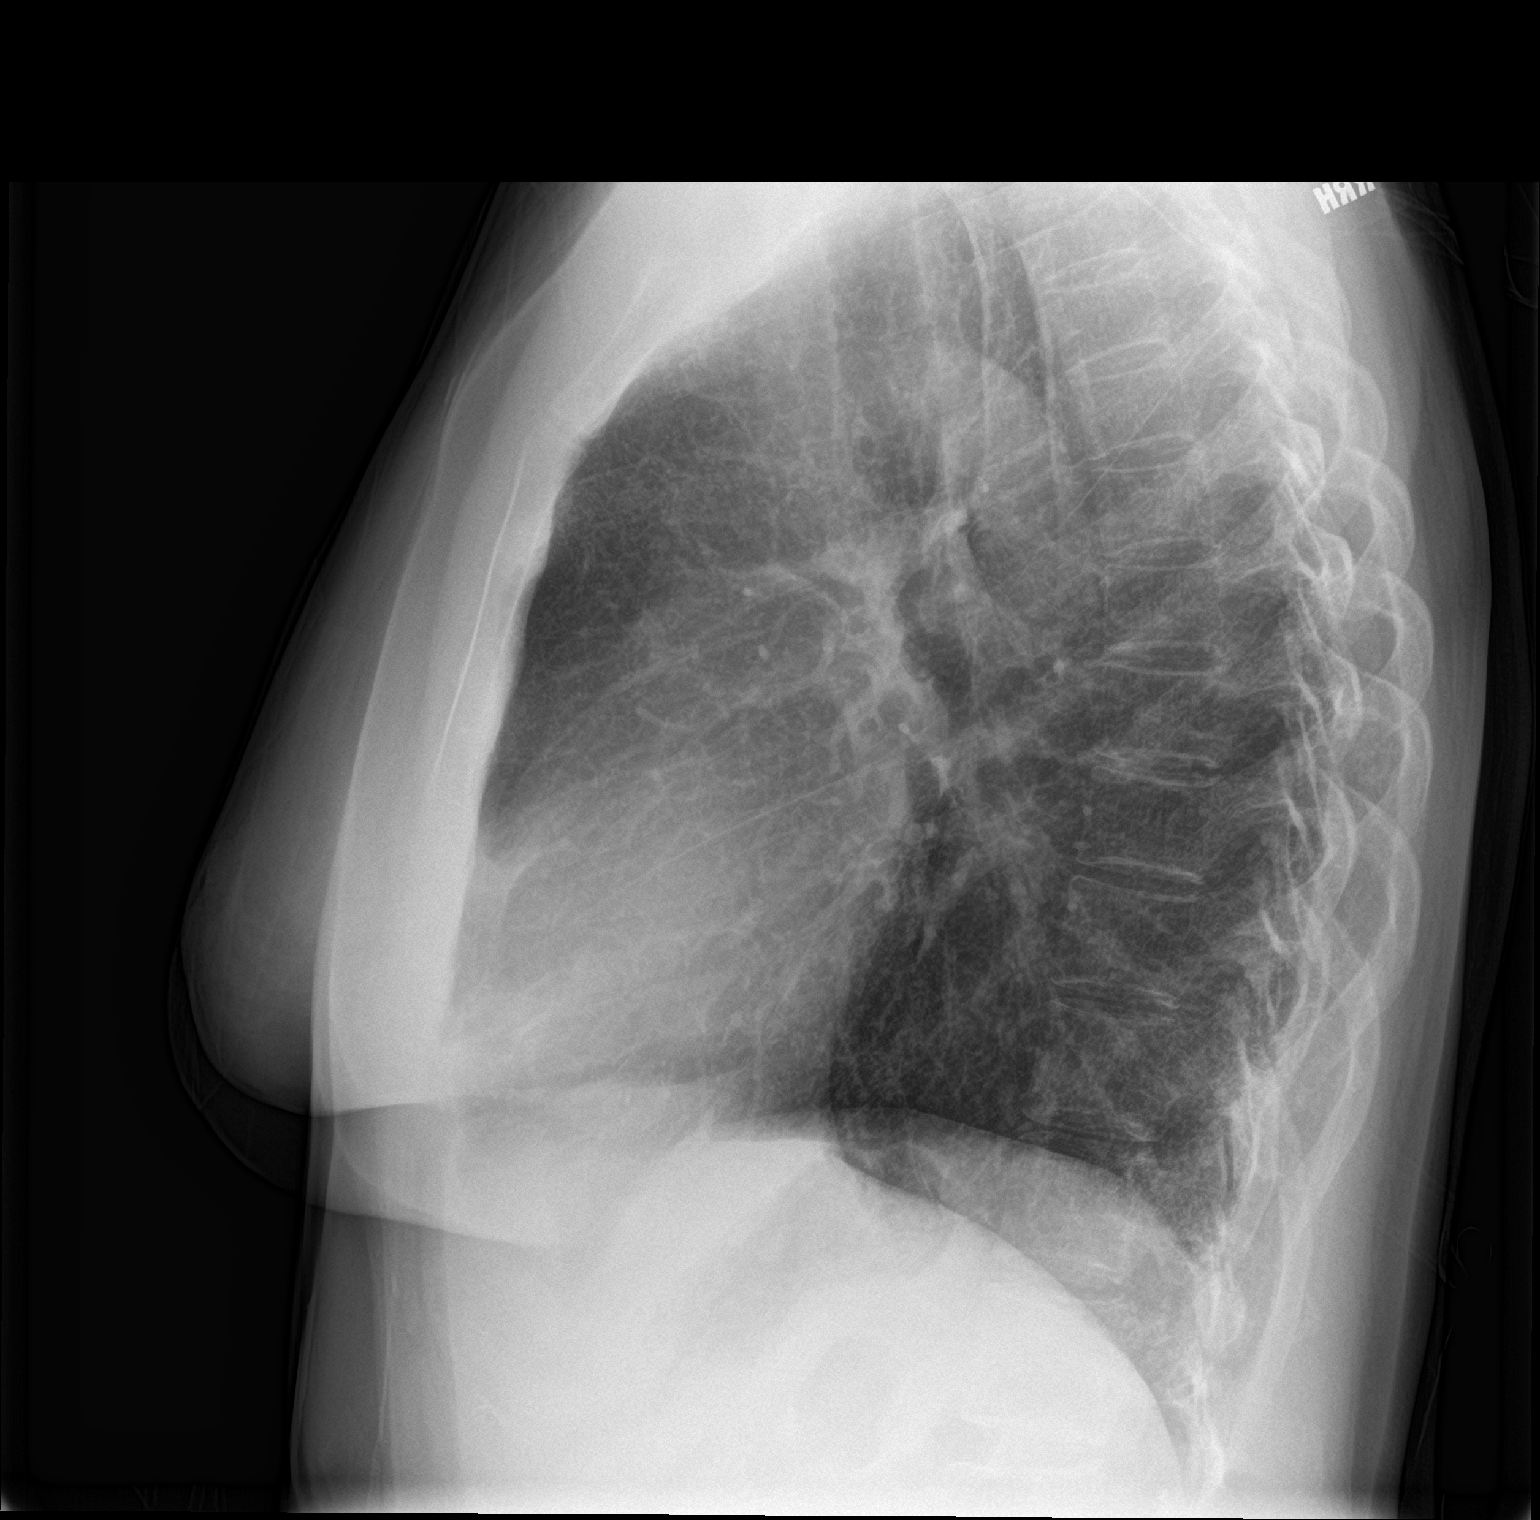

[2 of 2 positions shown; findings below may reference images not displayed]

FINDINGS: No active infiltrate or effusion is seen. The lungs are slightly
hyperaerated. On the lateral view there is a questionable subtle
nodular opacity overlying the lower thoracic spine. This could be
due to overlapping vessels and bony structures, but attention to
this area on followup chest x-ray is recommended. In reviewing this
area on CT of the abdomen pelvis with images through the lung bases,
this nodule was not seen on CT of 09/22/2014. If this area persists
on followup chest x-ray then a CT of the chest may be warranted.
Mediastinal and hilar contours are unremarkable. The heart is within
normal limits in size. No acute bony abnormality is seen.
IMPRESSION: 1. No pneumonia or effusion.  Slight hyper aeration.
2. Vague nodular opacity posteriorly at the lung base on the lateral
view may be due to overlapping vascular and bony structures but
recommend attention to this area on followup chest x-ray.

## 2017-11-15 ENCOUNTER — Emergency Department (HOSPITAL_COMMUNITY): Payer: Self-pay

## 2017-11-15 ENCOUNTER — Emergency Department (HOSPITAL_COMMUNITY)
Admission: EM | Admit: 2017-11-15 | Discharge: 2017-11-15 | Disposition: A | Discharge status: Home / Self Care | Payer: Self-pay | Attending: Emergency Medicine | Admitting: Emergency Medicine

## 2017-11-15 ENCOUNTER — Other Ambulatory Visit: Payer: Self-pay

## 2017-11-15 ENCOUNTER — Encounter (HOSPITAL_COMMUNITY): Payer: Self-pay

## 2017-11-15 DIAGNOSIS — Y9389 Activity, other specified: Secondary | ICD-10-CM | POA: Insufficient documentation

## 2017-11-15 DIAGNOSIS — Z87891 Personal history of nicotine dependence: Secondary | ICD-10-CM | POA: Insufficient documentation

## 2017-11-15 DIAGNOSIS — S161XXA Strain of muscle, fascia and tendon at neck level, initial encounter: Secondary | ICD-10-CM | POA: Insufficient documentation

## 2017-11-15 DIAGNOSIS — Y92410 Unspecified street and highway as the place of occurrence of the external cause: Secondary | ICD-10-CM | POA: Insufficient documentation

## 2017-11-15 DIAGNOSIS — Z79899 Other long term (current) drug therapy: Secondary | ICD-10-CM | POA: Insufficient documentation

## 2017-11-15 DIAGNOSIS — S39012A Strain of muscle, fascia and tendon of lower back, initial encounter: Secondary | ICD-10-CM | POA: Insufficient documentation

## 2017-11-15 DIAGNOSIS — R51 Headache: Secondary | ICD-10-CM | POA: Insufficient documentation

## 2017-11-15 DIAGNOSIS — Y999 Unspecified external cause status: Secondary | ICD-10-CM | POA: Insufficient documentation

## 2017-11-15 MED ORDER — HYDROCODONE-ACETAMINOPHEN 5-325 MG PO TABS
2.0000 | ORAL_TABLET | Freq: Once | ORAL | Status: AC
Start: 2017-11-15 — End: 2017-11-15
  Administered 2017-11-15: 2 via ORAL
  Filled 2017-11-15: qty 2
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
## 2017-11-15 NOTE — Discharge Instructions (Addendum)
You have neck pain, possibly from a cervical strain and/or pinched nerve.  ° °SEEK IMMEDIATE MEDICAL ATTENTION IF: °You develop difficulties swallowing or breathing.  °You have new or worse numbness, weakness, tingling, or movement problems in your arms or legs.  °You develop increasing pain which is uncontrolled with medications.  °You have change in bowel or bladder function, or other concerns. ° ° ° °

## 2017-11-15 NOTE — ED Notes (Signed)
Patient transported to CT 

## 2017-11-15 NOTE — ED Notes (Signed)
Patient returned from CT

## 2017-12-31 ENCOUNTER — Ambulatory Visit (HOSPITAL_COMMUNITY)
Admission: RE | Admit: 2017-12-31 | Discharge: 2017-12-31 | Disposition: A | Discharge status: Home / Self Care | Payer: Self-pay | Source: Ambulatory Visit | Attending: Family Medicine | Admitting: Family Medicine

## 2017-12-31 ENCOUNTER — Other Ambulatory Visit (HOSPITAL_COMMUNITY): Payer: Self-pay | Admitting: Family Medicine

## 2017-12-31 DIAGNOSIS — M79672 Pain in left foot: Secondary | ICD-10-CM | POA: Insufficient documentation

## 2017-12-31 DIAGNOSIS — R52 Pain, unspecified: Secondary | ICD-10-CM

## 2018-01-02 ENCOUNTER — Encounter: Payer: Self-pay | Admitting: Orthopaedic Surgery

## 2018-01-15 ENCOUNTER — Encounter: Payer: Self-pay | Admitting: Orthopaedic Surgery

## 2018-01-15 ENCOUNTER — Ambulatory Visit: Payer: Self-pay | Admitting: Orthopaedic Surgery

## 2018-01-15 VITALS — BP 136/80 | HR 89 | Temp 97.9°F | Ht 61.0 in | Wt 174.6 lb

## 2018-01-15 DIAGNOSIS — M25572 Pain in left ankle and joints of left foot: Secondary | ICD-10-CM
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
## 2018-01-21 ENCOUNTER — Telehealth: Payer: Self-pay | Admitting: Orthopaedic Surgery

## 2018-01-21 NOTE — Telephone Encounter (Signed)
MRI is about three to four times more expensive.  CT will tell us about the fracture fine.  MRI shows everything.  The radiologist was concerned about possible fracture, thus the CT. It is up to her.  If she wants a MRI, then it can be done.

## 2018-01-21 NOTE — Telephone Encounter (Addendum)
Patient called with concerns about having the CT verses MRI. She has a friend that had the same problem and the CT didn't show anything and the friend had to have a MRI. Patient is asking if the MRI would be better since she is self pay. States she can't afford both.  Please advise.  She also states she now has a boot and she is wearing it.

## 2018-01-22 NOTE — Telephone Encounter (Signed)
I spoke to her, to advise, she will proceed with the CT scan.

## 2018-01-29 ENCOUNTER — Ambulatory Visit (HOSPITAL_COMMUNITY)
Admission: RE | Admit: 2018-01-29 | Discharge: 2018-01-29 | Disposition: A | Discharge status: Home / Self Care | Payer: Self-pay | Source: Ambulatory Visit | Attending: Orthopaedic Surgery | Admitting: Orthopaedic Surgery

## 2018-01-29 DIAGNOSIS — R609 Edema, unspecified: Secondary | ICD-10-CM | POA: Insufficient documentation

## 2018-01-29 DIAGNOSIS — M7732 Calcaneal spur, left foot: Secondary | ICD-10-CM | POA: Insufficient documentation

## 2018-01-29 DIAGNOSIS — M25572 Pain in left ankle and joints of left foot: Secondary | ICD-10-CM | POA: Insufficient documentation

## 2018-01-29 MED ORDER — IOHEXOL 300 MG/ML  SOLN
75.0000 mL | Freq: Once | INTRAMUSCULAR | Status: AC | PRN
  Administered 2018-01-29: 75 mL via INTRAVENOUS

## 2018-01-31 ENCOUNTER — Encounter: Payer: Self-pay | Admitting: Orthopaedic Surgery

## 2018-01-31 ENCOUNTER — Ambulatory Visit: Payer: Self-pay | Admitting: Orthopaedic Surgery

## 2018-01-31 VITALS — BP 146/99 | HR 114 | Ht 61.0 in | Wt 176.0 lb

## 2018-01-31 DIAGNOSIS — M25572 Pain in left ankle and joints of left foot: Secondary | ICD-10-CM
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
## 2018-02-14 ENCOUNTER — Encounter: Payer: Self-pay | Admitting: Gastroenterology

## 2018-02-21 ENCOUNTER — Ambulatory Visit (INDEPENDENT_AMBULATORY_CARE_PROVIDER_SITE_OTHER): Payer: Self-pay | Admitting: Orthopaedic Surgery

## 2018-02-21 ENCOUNTER — Ambulatory Visit (INDEPENDENT_AMBULATORY_CARE_PROVIDER_SITE_OTHER): Payer: Self-pay

## 2018-02-21 ENCOUNTER — Encounter: Payer: Self-pay | Admitting: Orthopaedic Surgery

## 2018-02-21 DIAGNOSIS — M79672 Pain in left foot: Secondary | ICD-10-CM

## 2018-02-21 DIAGNOSIS — G8929 Other chronic pain: Secondary | ICD-10-CM

## 2018-02-21 NOTE — Patient Instructions (Signed)
Light duty work, no prolonged standing or walking.  Return Monday,.  Use CAM walker.  Elevate foot as needed.

## 2018-02-21 NOTE — Progress Notes (Signed)
CC:  My foot hurts  She has pain and swelling of the dorsum of the left midfoot.  She has no redness.  Her ankle pain is less.  She has been using the CAM walker.  ROM of the ankle on the left is full.  She has mid foot left dorsal swelling but no redness.  NV intact.  X-rays were done of the left foot today, reported separately.  They are negative.  Encounter Diagnosis  Name Primary?  . Chronic foot pain, left Yes   I have given contrast bath sheet instructions.  Use the CAM walker.  She can go back to work, light duty, no prolonged standing or walking, return Monday.  Return here in two weeks.  Call if any problem.  Precautions discussed.   Electronically Signed Sanjuana Kava, MD 8/15/20193:45 PM

## 2018-03-07 ENCOUNTER — Encounter: Payer: Self-pay | Admitting: Orthopaedic Surgery

## 2018-03-07 ENCOUNTER — Ambulatory Visit: Payer: Self-pay | Admitting: Orthopaedic Surgery

## 2018-03-07 VITALS — BP 126/85 | HR 89 | Ht 61.0 in | Wt 179.0 lb

## 2018-03-07 DIAGNOSIS — G8929 Other chronic pain: Secondary | ICD-10-CM

## 2018-03-07 DIAGNOSIS — M79672 Pain in left foot: Secondary | ICD-10-CM

## 2018-03-07 NOTE — Patient Instructions (Signed)
Light duty work

## 2018-03-07 NOTE — Progress Notes (Signed)
Patient [name], female DOB:[birth year], 57 y.o. TMH:[id number]  Chief Complaint  Patient presents with  . Foot Pain    left    HPI  [name] is a 57 y.o. female who has foot pain on the left foot which has gotten a little better. She has been using a CAM walker. She has swelling still.  She relates the swelling now to taking gabapentin and naproxen.  She is stopping them and has had less swelling.  She is doing light duty work.  I told her to come out of the CAM walker.   Body mass index is 33.82 kg/m.  ROS  Review of Systems  Constitutional: Positive for activity change.  Musculoskeletal: Positive for arthralgias, gait problem and joint swelling.  All other systems reviewed and are negative.   All other systems reviewed and are negative.  The following is a summary of the past history medically, past history surgically, known current medicines, social history and family history.  This information is gathered electronically by the computer from prior information and documentation.  I review this each visit and have found including this information at this point in the chart is beneficial and informative.    Past Medical History:  Diagnosis Date  . DDD (degenerative disc disease)    spinal injections  . DDD (degenerative disc disease), lumbar   . Diverticular disease   . Horseshoe kidney   . Hypercholesterolemia   . Irritable bowel syndrome     Past Surgical History:  Procedure Laterality Date  . cervical dysplasia    . COLONOSCOPY  07/01/2009   [id number] seen in the ascending and sigmoid colon/small internal hemorrhoids/6-mm sessile polyp/4-mm sessile polyp.3-mm sessile polyp removed. simple adenomas  . COLONOSCOPY N/A 03/25/2013   Procedure: COLONOSCOPY;  Surgeon: Danie Binder, MD;  [facility];  Service: Endoscopy;  Laterality: N/A;  10:15  . ESOPHAGEAL DILATION N/A 02/04/2015   Procedure: ESOPHAGEAL DILATION;  Surgeon: Danie Binder, MD;  [facility];  Service: Endoscopy;  Laterality: N/A;  . ESOPHAGOGASTRODUODENOSCOPY  07/01/2009   YJE:[id number] esophagus/dilation to 16 mm possible proximal cervical web  . ESOPHAGOGASTRODUODENOSCOPY N/A 02/04/2015   Procedure: ESOPHAGOGASTRODUODENOSCOPY (EGD);  Surgeon: Danie Binder, MD;  [facility];  Service: Endoscopy;  Laterality: N/A;  0800-moved to 845 Office to notify pt  . ESOPHAGOGASTRODUODENOSCOPY (EGD) WITH ESOPHAGEAL DILATION N/A 03/25/2013   Procedure: ESOPHAGOGASTRODUODENOSCOPY (EGD) WITH ESOPHAGEAL DILATION;  Surgeon: Danie Binder, MD;  [facility];  Service: Endoscopy;  Laterality: N/A;  . TONSILLECTOMY    . TUBAL LIGATION    . WRIST SURGERY      Family History  Problem Relation Age of Onset  . Colon cancer Brother        mid 57s, deceased  . Colon cancer Father        diagnosed at 75, deceased    Social History Social History   Tobacco Use  . Smoking status: Former Research scientist (life sciences)  . Smokeless tobacco: Never Used  Substance Use Topics  . Alcohol use: No  . Drug use: No    Allergies  Allergen Reactions  . Penicillins Anaphylaxis and Itching    Has patient had a PCN reaction causing immediate rash, facial/tongue/throat swelling, SOB or lightheadedness with hypotension: Yes Has patient had a PCN reaction causing severe rash involving mucus membranes or skin necrosis: No Has patient had a PCN reaction that required hospitalization Yes Has patient had a PCN reaction occurring  within the last 10 years: Yes If all of the above answers are "NO", then may proceed with Cephalosporin use.   . Codeine Nausea And Vomiting    Current Outpatient Medications  Medication Sig Dispense Refill  . acetaminophen (TYLENOL) 500 MG tablet Take 500 mg by mouth every 6 (six) hours as needed for mild pain or moderate pain.    Marland Kitchen gabapentin (NEURONTIN) 300 MG capsule Take 300 mg by mouth 2 (two) times daily.  5  . naproxen (NAPROSYN) 500 MG  tablet Take 500 mg by mouth 2 (two) times daily.    . potassium chloride SA (K-DUR,KLOR-CON) 20 MEQ tablet Take 1 tablet (20 mEq total) by mouth 2 (two) times daily. 8 tablet 0   No current facility-administered medications for this visit.      Physical Exam  Blood pressure 126/85, pulse 89, height 5\' 1"  (1.549 m), weight 179 lb (81.2 kg).  Constitutional: overall normal hygiene, normal nutrition, well developed, normal grooming, normal body habitus. Assistive device:Cam Walker left  Musculoskeletal: gait and station Limp left, muscle tone and strength are normal, no tremors or atrophy is present.  .  Neurological: coordination overall normal.  Deep tendon reflex/nerve stretch intact.  Sensation normal.  Cranial nerves II-XII intact.   Skin:   Normal overall no scars, lesions, ulcers or rashes. No psoriasis.  Psychiatric: Alert and oriented x 3.  Recent memory intact, remote memory unclear.  Normal mood and affect. Well groomed.  Good eye contact.  Cardiovascular: overall no swelling, no varicosities, no edema bilaterally, normal temperatures of the legs and arms, no clubbing, cyanosis and good capillary refill.  Lymphatic: palpation is normal.  Left foot has tenderness of the midfoot.  NV intact.  All other systems reviewed and are negative   The patient has been educated about the nature of the problem(s) and counseled on treatment options.  The patient appeared to understand what I have discussed and is in agreement with it.  Encounter Diagnosis  Name Primary?  . Chronic foot pain, left Yes    PLAN Call if any problems.  Precautions discussed.  Continue current medications.   Return to clinic 2 weeks   Light duty work

## 2018-03-21 ENCOUNTER — Ambulatory Visit: Payer: Self-pay | Admitting: Orthopaedic Surgery

## 2018-03-26 ENCOUNTER — Ambulatory Visit: Payer: Self-pay | Admitting: Orthopaedic Surgery

## 2018-04-17 ENCOUNTER — Telehealth: Payer: Self-pay | Admitting: Orthopaedic Surgery

## 2018-04-17 NOTE — Telephone Encounter (Signed)
I tried to call the patient back to let her know what Dr. Luna Glasgow said,  Her voicemail box has not been set up.   I did leave a message on her sister Tammy's answering machine asking her to have [place] call us

## 2018-04-17 NOTE — Telephone Encounter (Signed)
Patient was last seen by you on 03/07/18 and has been unable to return due to not having any money.  She was working light duty at that time.  She wants to know if she can get a return to work full duty note without being seen.  I told her that I would have to ask to see if you would do this and if you needed to see her one more time  Please advise

## 2018-04-17 NOTE — Telephone Encounter (Signed)
Yes, you can give note to return to work.

## 2018-04-18 ENCOUNTER — Encounter: Payer: Self-pay | Admitting: Orthopaedic Surgery

## 2018-08-06 ENCOUNTER — Emergency Department (HOSPITAL_COMMUNITY)
Admission: EM | Admit: 2018-08-06 | Discharge: 2018-08-06 | Disposition: A | Discharge status: Home / Self Care | Payer: PRIVATE HEALTH INSURANCE | Attending: Emergency Medicine | Admitting: Emergency Medicine

## 2018-08-06 ENCOUNTER — Other Ambulatory Visit: Payer: Self-pay

## 2018-08-06 ENCOUNTER — Encounter (HOSPITAL_COMMUNITY): Payer: Self-pay | Admitting: Emergency Medicine

## 2018-08-06 DIAGNOSIS — Z79899 Other long term (current) drug therapy: Secondary | ICD-10-CM | POA: Insufficient documentation

## 2018-08-06 DIAGNOSIS — H9311 Tinnitus, right ear: Secondary | ICD-10-CM | POA: Insufficient documentation

## 2018-08-06 DIAGNOSIS — X500XXA Overexertion from strenuous movement or load, initial encounter: Secondary | ICD-10-CM | POA: Diagnosis not present

## 2018-08-06 DIAGNOSIS — Y929 Unspecified place or not applicable: Secondary | ICD-10-CM | POA: Diagnosis not present

## 2018-08-06 DIAGNOSIS — S161XXA Strain of muscle, fascia and tendon at neck level, initial encounter: Secondary | ICD-10-CM | POA: Insufficient documentation

## 2018-08-06 DIAGNOSIS — Y999 Unspecified external cause status: Secondary | ICD-10-CM | POA: Diagnosis not present

## 2018-08-06 DIAGNOSIS — Y9389 Activity, other specified: Secondary | ICD-10-CM | POA: Diagnosis not present

## 2018-08-06 MED ORDER — PREDNISONE 50 MG PO TABS
60.0000 mg | ORAL_TABLET | Freq: Once | ORAL | Status: AC
  Administered 2018-08-06: 60 mg via ORAL
  Filled 2018-08-06: qty 1

## 2018-08-06 MED ORDER — CYCLOBENZAPRINE HCL 10 MG PO TABS
10.0000 mg | ORAL_TABLET | Freq: Once | ORAL | Status: AC
  Administered 2018-08-06: 10 mg via ORAL
  Filled 2018-08-06: qty 1

## 2018-08-06 MED ORDER — CYCLOBENZAPRINE HCL 5 MG PO TABS: 5.0000 mg | ORAL_TABLET | Freq: Three times a day (TID) | ORAL | 0 refills | Status: DC | PRN

## 2018-08-06 MED ORDER — PREDNISONE 10 MG PO TABS: ORAL_TABLET | ORAL | 0 refills | Status: DC

## 2018-08-06 NOTE — Discharge Instructions (Addendum)
As discussed, many things can cause ringing in the ear.  There is no sign of infection in or around your ear today.  You may need to see a specialist such as Dr. Benjamine Mola if your symptoms persist.  I recommend using ambient noise such as a radio or TV if this symptom keeps you awake at night, this can help to minimize the ringing.  The CT scan you had last summer here shows significant degenerative disc issues in your cervical spine which could explain your chronic neck pain and muscle soreness.  I would continue using a heating pad for 20 minutes several times daily to your neck and shoulder area.  Take your next dose of prednisone tomorrow evening.  Use the muscle relaxer as prescribed, use caution as this can make you drowsy.

## 2018-08-06 NOTE — ED Triage Notes (Signed)
Pt C/O ringing in her right hear that started after "popping a bump in my ear." Pt has put peroxide in her ear with no relief. Pt also having recurrent left shoulder pain that starts while at work. 

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
## 2018-08-24 ENCOUNTER — Emergency Department (HOSPITAL_COMMUNITY): Payer: PRIVATE HEALTH INSURANCE

## 2018-08-24 ENCOUNTER — Emergency Department (HOSPITAL_COMMUNITY)
Admission: EM | Admit: 2018-08-24 | Discharge: 2018-08-25 | Disposition: A | Discharge status: Home / Self Care | Payer: PRIVATE HEALTH INSURANCE | Attending: Emergency Medicine | Admitting: Emergency Medicine

## 2018-08-24 ENCOUNTER — Encounter (HOSPITAL_COMMUNITY): Payer: Self-pay

## 2018-08-24 ENCOUNTER — Other Ambulatory Visit: Payer: Self-pay

## 2018-08-24 DIAGNOSIS — Z79899 Other long term (current) drug therapy: Secondary | ICD-10-CM | POA: Diagnosis not present

## 2018-08-24 DIAGNOSIS — Z87891 Personal history of nicotine dependence: Secondary | ICD-10-CM | POA: Diagnosis not present

## 2018-08-24 DIAGNOSIS — R072 Precordial pain: Secondary | ICD-10-CM | POA: Diagnosis not present

## 2018-08-24 DIAGNOSIS — R079 Chest pain, unspecified: Secondary | ICD-10-CM | POA: Diagnosis present

## 2018-08-24 LAB — COMPREHENSIVE METABOLIC PANEL
ALT: 27 U/L (ref 0–44)
AST: 26 U/L (ref 15–41)
Albumin: 3.7 g/dL (ref 3.5–5.0)
Alkaline Phosphatase: 80 U/L (ref 38–126)
Anion gap: 12 (ref 5–15)
BUN: 8 mg/dL (ref 6–20)
CO2: 17 mmol/L — ABNORMAL LOW (ref 22–32)
CREATININE: 0.87 mg/dL (ref 0.44–1.00)
Calcium: 8.4 mg/dL — ABNORMAL LOW (ref 8.9–10.3)
Chloride: 100 mmol/L (ref 98–111)
GFR calc Af Amer: >60 mL/min (ref >60)
GFR calc non Af Amer: >60 mL/min (ref >60)
Glucose, Bld: 295 mg/dL — ABNORMAL HIGH (ref 70–99)
POTASSIUM: 3.4 mmol/L — AB (ref 3.5–5.1)
Sodium: 129 mmol/L — ABNORMAL LOW (ref 135–145)
Total Bilirubin: 0.7 mg/dL (ref 0.3–1.2)
Total Protein: 6.3 g/dL — ABNORMAL LOW (ref 6.5–8.1)

## 2018-08-24 LAB — URINALYSIS, ROUTINE W REFLEX MICROSCOPIC
Bacteria, UA: NONE SEEN
Bilirubin Urine: NEGATIVE
Glucose, UA: >=500 mg/dL — AB
Hgb urine dipstick: NEGATIVE
KETONES UR: NEGATIVE mg/dL
Leukocytes,Ua: NEGATIVE
Nitrite: NEGATIVE
Protein, ur: NEGATIVE mg/dL
Specific Gravity, Urine: 1.02 (ref 1.005–1.030)
pH: 6 (ref 5.0–8.0)

## 2018-08-24 LAB — RAPID URINE DRUG SCREEN, HOSP PERFORMED
Amphetamines: NOT DETECTED
BENZODIAZEPINES: NOT DETECTED
Barbiturates: NOT DETECTED
Cocaine: NOT DETECTED
Opiates: NOT DETECTED
Tetrahydrocannabinol: NOT DETECTED

## 2018-08-24 LAB — CBC WITH DIFFERENTIAL/PLATELET
Abs Immature Granulocytes: 0.24 10*3/uL — ABNORMAL HIGH (ref 0.00–0.07)
Basophils Absolute: 0 10*3/uL (ref 0.0–0.1)
Basophils Relative: 0 %
EOS ABS: 0.1 10*3/uL (ref 0.0–0.5)
Eosinophils Relative: 1 %
HCT: 37.2 % (ref 36.0–46.0)
Hemoglobin: 11.9 g/dL — ABNORMAL LOW (ref 12.0–15.0)
IMMATURE GRANULOCYTES: 2 %
Lymphocytes Relative: 16 %
Lymphs Abs: 2.1 10*3/uL (ref 0.7–4.0)
MCH: 29.2 pg (ref 26.0–34.0)
MCHC: 32 g/dL (ref 30.0–36.0)
MCV: 91.4 fL (ref 80.0–100.0)
Monocytes Absolute: 0.7 10*3/uL (ref 0.1–1.0)
Monocytes Relative: 6 %
Neutro Abs: 9.7 10*3/uL — ABNORMAL HIGH (ref 1.7–7.7)
Neutrophils Relative %: 75 %
Platelets: 204 10*3/uL (ref 150–400)
RBC: 4.07 MIL/uL (ref 3.87–5.11)
RDW: 14.5 % (ref 11.5–15.5)
WBC: 12.8 10*3/uL — ABNORMAL HIGH (ref 4.0–10.5)
nRBC: 0.2 % (ref 0.0–0.2)

## 2018-08-24 LAB — TYPE AND SCREEN
ABO/RH(D): O POS
Antibody Screen: NEGATIVE

## 2018-08-24 LAB — TROPONIN I: Troponin I: <0.03 ng/mL (ref <0.03)

## 2018-08-24 MED ORDER — SODIUM CHLORIDE 0.9 % IV BOLUS
1000.0000 mL | Freq: Once | INTRAVENOUS | Status: AC
  Administered 2018-08-25: 1000 mL via INTRAVENOUS

## 2018-08-24 MED ORDER — MORPHINE SULFATE (PF) 4 MG/ML IV SOLN
4.0000 mg | Freq: Once | INTRAVENOUS | Status: AC
  Administered 2018-08-24: 4 mg via INTRAVENOUS
  Filled 2018-08-24: qty 1

## 2018-08-24 NOTE — ED Notes (Signed)
Patient transported to X-ray 

## 2018-08-24 NOTE — ED Triage Notes (Signed)
Pt from home w/ a c/o a "shocking" like pain that has been constant for the past couple of weeks. Pt reports that she has "degenerative bone disease" that is now radiating from her back to her chest, and down her left arm. She's being treated w/ prednisone and muscle relaxers.   Additional complaints of rectal bleeding for the past week. Bright red blood noted in her stool. Hx of IBS and hemorrhoids.

## 2018-08-25 LAB — ABO/RH: ABO/RH(D): O POS

## 2018-08-25 NOTE — ED Provider Notes (Signed)
[facility] EMERGENCY DEPARTMENT Provider Note   CSN: [id number] Arrival date & time: 08/24/18  2037     History   Chief Complaint Chief Complaint  Patient presents with  . Chest Pain    HPI [name] is a 58 y.o. female.  HPI 58 year old female presents the emergency department with ongoing complaints of shocking-like pain in her anterior chest with radiation from her neck and back.  She states this been present for several months but worsening over the past several weeks.  She had an MRI of her neck performed in the summer 2019 which demonstrated degenerative joint disease.  She reports the shocking-like sensation is coming down her left arm.  No associated shortness of breath.  She denies fevers and chills.  No productive cough.  No upper or lower extremity weakness.  Patient also reports some blood in the toilet for the past 10 days with bowel movements.  She has a history of IBS and hemorrhoids.  She has a GI doctor in [facility].  She denies melena.  No history of diverticulosis.   Past Medical History:  Diagnosis Date  . DDD (degenerative disc disease)    spinal injections  . DDD (degenerative disc disease), lumbar   . Diverticular disease   . Horseshoe kidney   . Hypercholesterolemia   . Irritable bowel syndrome     Patient Active Problem List   Diagnosis Date Noted  . Dysphagia, pharyngoesophageal phase   . Dysphagia 03/07/2013  . ACUTE SINUSITIS, UNSPECIFIED 06/29/2009  . GERD, SEVERE 06/29/2009  . Irritable bowel syndrome 06/29/2009  . Hemorrhoids, internal 06/29/2009    Past Surgical History:  Procedure Laterality Date  . cervical dysplasia    . COLONOSCOPY  07/01/2009   OVF:[id number] seen in the ascending and sigmoid colon/small internal hemorrhoids/6-mm sessile polyp/4-mm sessile polyp.3-mm sessile polyp removed. simple adenomas  . COLONOSCOPY N/A 03/25/2013   Procedure: COLONOSCOPY;  Surgeon: Danie Binder,  MD;  [facility];  Service: Endoscopy;  Laterality: N/A;  10:15  . ESOPHAGEAL DILATION N/A 02/04/2015   Procedure: ESOPHAGEAL DILATION;  Surgeon: Danie Binder, MD;  Location: [facility];  Service: Endoscopy;  Laterality: N/A;  . ESOPHAGOGASTRODUODENOSCOPY  07/01/2009   ZYS:[id number] esophagus/dilation to 16 mm possible proximal cervical web  . ESOPHAGOGASTRODUODENOSCOPY N/A 02/04/2015   Procedure: ESOPHAGOGASTRODUODENOSCOPY (EGD);  Surgeon: Danie Binder, MD;  [facility];  Service: Endoscopy;  Laterality: N/A;  0800-moved to 845 Office to notify pt  . ESOPHAGOGASTRODUODENOSCOPY (EGD) WITH ESOPHAGEAL DILATION N/A 03/25/2013   Procedure: ESOPHAGOGASTRODUODENOSCOPY (EGD) WITH ESOPHAGEAL DILATION;  Surgeon: Danie Binder, MD;  [facility];  Service: Endoscopy;  Laterality: N/A;  . TONSILLECTOMY    . TUBAL LIGATION    . WRIST SURGERY       OB History   No obstetric history on file.      Home Medications    Prior to Admission medications   Medication Sig Start Date End Date Taking? Authorizing Provider  acetaminophen (TYLENOL) 500 MG tablet Take 500 mg by mouth every 6 (six) hours as needed for mild pain or moderate pain.   Yes [provider]  oxyCODONE (OXY IR/ROXICODONE) 5 MG immediate release tablet Take 5 mg by mouth every 4 (four) hours as needed for severe pain.   Yes [provider]  predniSONE (DELTASONE) 10 MG tablet Take 6 tablets day one, 5 tablets day two, 4 tablets day three, 3 tablets day four,  2 tablets day five, then 1 tablet day six Patient taking differently: Take 20 mg by mouth 2 (two) times daily.  08/06/18  Yes Idol, Almyra Free, PA-C  cyclobenzaprine (FLEXERIL) 5 MG tablet Take 1 tablet (5 mg total) by mouth 3 (three) times daily as needed for muscle spasms. Patient not taking: Reported on 08/24/2018 08/06/18   Evalee Jefferson, PA-C  potassium chloride SA (K-DUR,KLOR-CON) 20 MEQ tablet Take 1 tablet (20 mEq total) by mouth 2  (two) times daily. Patient not taking: Reported on 08/24/2018 06/13/16 06/17/16  Duffy Bruce, MD    Family History Family History  Problem Relation Age of Onset  . Colon cancer Brother        mid 90s, deceased  . Colon cancer Father        diagnosed at 49, deceased    Social History Social History   Tobacco Use  . Smoking status: Former Research scientist (life sciences)  . Smokeless tobacco: Never Used  Substance Use Topics  . Alcohol use: No  . Drug use: No     Allergies   Penicillins and Codeine   Review of Systems Review of Systems  All other systems reviewed and are negative.    Physical Exam Updated Vital Signs BP 128/79   Pulse 98   Resp (!) 21   Ht 5\' 1"  (1.549 m)   Wt 81.6 kg   SpO2 97%   BMI 34.01 kg/m   Physical Exam Vitals signs and nursing note reviewed.  Constitutional:      General: She is not in acute distress.    Appearance: She is well-developed.  HENT:     Head: Normocephalic and atraumatic.  Neck:     Musculoskeletal: Normal range of motion.  Cardiovascular:     Rate and Rhythm: Normal rate and regular rhythm.     Heart sounds: Normal heart sounds.  Pulmonary:     Effort: Pulmonary effort is normal.     Breath sounds: Normal breath sounds.  Abdominal:     General: There is no distension.     Palpations: Abdomen is soft.     Tenderness: There is no abdominal tenderness.  Musculoskeletal: Normal range of motion.  Skin:    General: Skin is warm and dry.  Neurological:     Mental Status: She is alert and oriented to person, place, and time.  Psychiatric:        Judgment: Judgment normal.      ED Treatments / Results  Labs (all labs ordered are listed, but only abnormal results are displayed) Labs Reviewed  CBC WITH DIFFERENTIAL/PLATELET - Abnormal; Notable for the following components:      Result Value   WBC 12.8 (*)    Hemoglobin 11.9 (*)    Neutro Abs 9.7 (*)    Abs Immature Granulocytes 0.24 (*)    All other components within normal  limits  COMPREHENSIVE METABOLIC PANEL - Abnormal; Notable for the following components:   Sodium 129 (*)    Potassium 3.4 (*)    CO2 17 (*)    Glucose, Bld 295 (*)    Calcium 8.4 (*)    Total Protein 6.3 (*)    All other components within normal limits  URINALYSIS, ROUTINE W REFLEX MICROSCOPIC - Abnormal; Notable for the following components:   Color, Urine STRAW (*)    Glucose, UA >=500 (*)    All other components within normal limits  TROPONIN I  RAPID URINE DRUG SCREEN, HOSP PERFORMED  TYPE AND SCREEN  ABO/RH  EKG EKG Interpretation  Date/Time:  Saturday August 24 2018 20:48:01 EST Ventricular Rate:  113 PR Interval:    QRS Duration: 149 QT Interval:  330 QTC Calculation: 453 R Axis:   50 Text Interpretation:  Sinus tachycardia Nonspecific intraventricular conduction delay No significant change was found Confirmed by Jola Schmidt [phone]) on 08/24/2018 10:12:11 PM   Radiology Dg Chest 2 View  Result Date: 08/24/2018 CLINICAL DATA:  Chest pain EXAM: CHEST - 2 VIEW COMPARISON:  June 13, 2016 FINDINGS: The heart size and mediastinal contours are within normal limits. There is no focal infiltrate, pulmonary edema, or pleural effusion. The visualized skeletal structures are unremarkable. IMPRESSION: No active cardiopulmonary disease. Electronically Signed   By: Abelardo Diesel M.D.   On: 08/24/2018 21:29    Procedures Procedures (including critical care time)  Medications Ordered in ED Medications  morphine 4 MG/ML injection 4 mg (4 mg Intravenous Given 08/24/18 2122)  sodium chloride 0.9 % bolus 1,000 mL (1,000 mLs Intravenous New Bag/Given 08/25/18 0019)     Initial Impression / Assessment and Plan / ED Course  I have reviewed the triage vital signs and the nursing notes.  Pertinent labs & imaging results that were available during my care of the patient were reviewed by me and considered in my medical decision making (see chart for details).     Work-up in  the emergency department without significant abnormality.  Doubt ACS.  Doubt PE.  Doubt dissection.  EKG without ischemic changes.  Close follow-up with her GI doctor regarding the bleeding which is likely hemorrhoidal in nature.  Hemoglobin is stable.  No indication for additional work-up at this time.  Close outpatient follow-up.  Fluids given for likely mild dehydration secondary to decreased oral intake today.  Final Clinical Impressions(s) / ED Diagnoses   Final diagnoses:  Precordial chest pain    ED Discharge Orders    None       Jola Schmidt, MD 08/25/18 [phone]

## 2018-08-25 NOTE — ED Notes (Signed)
Patient verbalizes understanding of discharge instructions. Opportunity for questioning and answers were provided. Armband removed by staff, pt discharged from ED.  

## 2018-08-25 NOTE — Discharge Instructions (Addendum)
Take your pain medicine as prescribed by your doctor  Follow-up with your GI doctor regarding the rectal bleeding.

## 2018-08-27 ENCOUNTER — Other Ambulatory Visit: Payer: Self-pay | Admitting: Family Medicine

## 2018-08-27 DIAGNOSIS — M5412 Radiculopathy, cervical region: Secondary | ICD-10-CM

## 2018-08-28 ENCOUNTER — Ambulatory Visit (HOSPITAL_COMMUNITY): Payer: PRIVATE HEALTH INSURANCE

## 2018-08-29 ENCOUNTER — Other Ambulatory Visit: Payer: Self-pay

## 2018-08-29 ENCOUNTER — Ambulatory Visit (INDEPENDENT_AMBULATORY_CARE_PROVIDER_SITE_OTHER): Payer: PRIVATE HEALTH INSURANCE | Admitting: Gastroenterology

## 2018-08-29 ENCOUNTER — Encounter: Payer: Self-pay | Admitting: Gastroenterology

## 2018-08-29 DIAGNOSIS — K625 Hemorrhage of anus and rectum: Secondary | ICD-10-CM

## 2018-08-29 DIAGNOSIS — R739 Hyperglycemia, unspecified: Secondary | ICD-10-CM | POA: Diagnosis not present

## 2018-08-29 DIAGNOSIS — M542 Cervicalgia: Secondary | ICD-10-CM

## 2018-08-29 DIAGNOSIS — K219 Gastro-esophageal reflux disease without esophagitis: Secondary | ICD-10-CM | POA: Diagnosis not present

## 2018-08-29 DIAGNOSIS — R1310 Dysphagia, unspecified: Secondary | ICD-10-CM | POA: Diagnosis not present

## 2018-08-29 DIAGNOSIS — M79601 Pain in right arm: Secondary | ICD-10-CM

## 2018-08-29 DIAGNOSIS — M79602 Pain in left arm: Secondary | ICD-10-CM

## 2018-08-29 DIAGNOSIS — R1319 Other dysphagia: Secondary | ICD-10-CM

## 2018-08-29 MED ORDER — OMEPRAZOLE 20 MG PO CPDR: DELAYED_RELEASE_CAPSULE | ORAL | 3 refills | Status: DC

## 2018-08-29 NOTE — Progress Notes (Unsigned)
mb

## 2018-08-29 NOTE — Assessment & Plan Note (Signed)
SYMPTOMS NOT CONTROLLED ON AN H2B.    TO CONTROL HEARTBURN:   1. DRINK WATER TO KEEP YOUR URINE LIGHT YELLOW.    2. CONTINUE YOUR WEIGHT LOSS EFFORTS. YOUR BODY MASS INDEX IS OVER 30 WHICH MEANS YOU ARE OBESE. OBESITY IS ASSOCIATED WITH AN INCREASED FOR CIRRHOSIS AND ALL CANCERS, INCLUDING ESOPHAGEAL AND COLON CANCER. A WEIGHT OF 155 LBS OR LESS  WILL GET YOUR BODY MASS INDEX(BMI) UNDER 30.    3. CONTINUE OMEPRAZOLE.  TAKE 30 MINUTES PRIOR TO YOUR FIRST MEAL.    4. PEPCID HELPS MOST WHEN USED AS NEEDED. USE WHEN YOU HAVE BREAKTHROUGH HEARTBURN/REFLUX. FOLLOW UP IN 1 YEAR

## 2018-08-29 NOTE — Progress Notes (Signed)
Subjective:    Patient ID: [name], female    DOB: 24-Jun-1961, 58 y.o.   MRN: [id number]  Lucia Gaskins, MD  HPI HAVING trouble with rectal bleeding for past 3 weeks. HAS BETTER INSURANCE AND NOW READY TO HAVE TCS. SEEING BLOOD 1-2X/DAY: WIPES AND SEES IT, MAY POUR OUT. NO HARD STOOLS. BMs: ONCE A DAY. READING ON TOILET BECAUSE IT TAKES A LONG TIME TO GET THE STOOL OUT. ALSO HAVING TROUBLE WITH LEFT ARM/NECK PAIN AND IT'S SEVERE. HAVING UPPER ABDOMINAL PAIN: SHARP/DULL, OCCURS 4-5X/WEEK, JUST IN RUQ. WEIGHT LOSS: NO, APPETITE: OK. PILLS GET STUCK GOING DOWN. SOLID FOOD GETS STUCK GOING DOWN. NO PROBLEMS WITH LIQUIDS. HEARTBURN: NOT CONTROLLED ON PEPCID. GAINED 32 LBS SINCE 2016. C/O BLOATING AFTER EATING AND BEING MISERABLE.  PT DENIES FEVER, CHILLS, HEMATEMESIS, vomiting, melena, diarrhea, CHEST PAIN, SHORTNESS OF BREATH, CHANGE IN BOWEL IN HABITS, constipation, OR problems with sedation.  Past Medical History:  Diagnosis Date  . DDD (degenerative disc disease)    spinal injections  . DDD (degenerative disc disease), lumbar   . Diverticular disease   . Horseshoe kidney   . Hypercholesterolemia   . Irritable bowel syndrome    Past Surgical History:  Procedure Laterality Date  . cervical dysplasia    . COLONOSCOPY  07/01/2009   RSW:[id number] seen in the ascending and sigmoid colon/small internal hemorrhoids/6-mm sessile polyp/4-mm sessile polyp.3-mm sessile polyp removed. simple adenomas  . COLONOSCOPY N/A 03/25/2013   Procedure: COLONOSCOPY;  Surgeon: Danie Binder, MD;  [facility];  Service: Endoscopy;  Laterality: N/A;  10:15  . ESOPHAGEAL DILATION N/A 02/04/2015   Procedure: ESOPHAGEAL DILATION;  Surgeon: Danie Binder, MD;  [facility];  Service: Endoscopy;  Laterality: N/A;  . ESOPHAGOGASTRODUODENOSCOPY  07/01/2009   FGH:[id number] esophagus/dilation to 16 mm possible proximal cervical web  . ESOPHAGOGASTRODUODENOSCOPY N/A 02/04/2015   Procedure: ESOPHAGOGASTRODUODENOSCOPY (EGD);  Surgeon: Danie Binder, MD;  [facility];  Service: Endoscopy;  Laterality: N/A;  0800-moved to 845 Office to notify pt  . ESOPHAGOGASTRODUODENOSCOPY (EGD) WITH ESOPHAGEAL DILATION N/A 03/25/2013   Procedure: ESOPHAGOGASTRODUODENOSCOPY (EGD) WITH ESOPHAGEAL DILATION;  Surgeon: Danie Binder, MD;  [facility];  Service: Endoscopy;  Laterality: N/A;  . TONSILLECTOMY    . TUBAL LIGATION    . WRIST SURGERY     Allergies  Allergen Reactions  . Penicillins Anaphylaxis and Itching      . Codeine Nausea And Vomiting   Current Outpatient Medications  Medication Sig    . acetaminophen (TYLENOL) 500 MG tablet Take 500 mg by mouth every 6 (six) hours as needed for mild pain or moderate pain.    . Ascorbic Acid (VITAMIN C PO) Take by mouth daily.    . famotidine (PEPCID) 20 MG tablet Take 20 mg by mouth as needed for heartburn or indigestion.    Marland Kitchen oxyCODONE (OXY IR/ROXICODONE) 5 MG immediate release tablet Take 5 mg by mouth every 4 (four) hours as needed for severe pain.    . predniSONE (DELTASONE) 10 MG tablet  Take 20 mg by mouth 2 (two) times daily. )    .      Marland Kitchen       Review of Systems PER HPI OTHERWISE ALL SYSTEMS ARE NEGATIVE. LAST LABS FEB 15-GLU ELEVATED.    Objective:   Physical Exam Vitals signs reviewed.  Constitutional:      General: She is not in acute distress.    Appearance: She is  well-developed.  HENT:     Head: Normocephalic and atraumatic.     Mouth/Throat:     Pharynx: No oropharyngeal exudate.  Eyes:     General: No scleral icterus.    Pupils: Pupils are equal, round, and reactive to light.  Neck:     Musculoskeletal: Normal range of motion and neck supple.  Cardiovascular:     Rate and Rhythm: Normal rate and regular rhythm.     Heart sounds: Normal heart sounds.  Pulmonary:     Effort: Pulmonary effort is normal. No respiratory distress.     Breath sounds: Normal breath sounds.  Abdominal:      General: Bowel sounds are normal. There is no distension.     Palpations: Abdomen is soft.     Tenderness: There is no abdominal tenderness.  Musculoskeletal:     Right lower leg: Edema present.     Left lower leg: Edema present.     Comments: Trace bilateral lower extremities  Lymphadenopathy:     Cervical: No cervical adenopathy.  Skin:    General: Skin is warm and dry.  Neurological:     Mental Status: She is alert and oriented to person, place, and time. Mental status is at baseline.     Comments: NO  NEW FOCAL DEFICITS  Psychiatric:        Mood and Affect: Mood normal.        Behavior: Behavior normal.     Comments: NORMAL AFFECT, tearful when talking about neck/arm pain       Assessment & Plan:

## 2018-08-29 NOTE — Assessment & Plan Note (Signed)
SYMPTOMS NOT CONTROLLED. DIFFERENTIAL DIAGNOSIS INCLUDES HEMORRHOIDS, COLON POLYPS, AVMs, & LESS LIKELY COLON CA.  SEE DR. DONDIEGO FOR AN ELEVATED BLOOD SUGAR. USE PREPARATION H UP 4 TIMES A DAY FOR 7 DAYS THEN AS NEEDED TO CONTROL RECTAL BLEEDING, ITCHING, PRESSURE, BURNING, OR PAIN. COMPLETE COLONOSCOPY WITH PROPOFOL IN 3-4 WEEKS. YOU MAY BRING THE ENEMA TO ADMINISTER IN THE PREOP AREA. DISCUSSED PROCEDURE, BENEFITS, & RISKS: < 1% chance of medication reaction, bleeding, perforation, or rupture of spleen/liver. SEE NEUROLOGY FOR ARM PAIN/NECK AND FOOT PAIN. FOLLOW UP IN 1 YEAR

## 2018-08-29 NOTE — Assessment & Plan Note (Signed)
SYMPTOMS NOT CONTROLLED.  SEE NEUROLOGY FOR ARM PAIN/NECK AND FOOT PAIN.  FOLLOW UP IN 1 YEAR.

## 2018-08-29 NOTE — Assessment & Plan Note (Signed)
ASSOCIATED WITH > 30 LBS WEIGHT GAIN/PREDNISONE.  SEE DR. DONDIEGO FOR AN ELEVATED BLOOD SUGAR. FOLLOW UP IN 1 YEAR.

## 2018-08-29 NOTE — Assessment & Plan Note (Signed)
SYMPTOMS NOT CONTROLLED DUE TO UNCONTROLLED GERD.   CONTINUE TO MONITOR SYMPTOMS. SEE DR. DONDIEGO FOR AN ELEVATED BLOOD SUGAR.  TO CONTROL HEARTBURN:   1. DRINK WATER TO KEEP YOUR URINE LIGHT YELLOW.   2. CONTINUE YOUR WEIGHT LOSS EFFORTS. YOUR BODY MASS INDEX IS OVER 30 WHICH MEANS YOU ARE OBESE. OBESITY IS ASSOCIATED WITH AN INCREASED FOR CIRRHOSIS AND ALL CANCERS, INCLUDING ESOPHAGEAL AND COLON CANCER. A WEIGHT OF 155 LBS OR LESS  WILL GET YOUR BODY MASS INDEX(BMI) UNDER 30.   3. CONTINUE OMEPRAZOLE.  TAKE 30 MINUTES PRIOR TO YOUR FIRST MEAL.   4. PEPCID HELPS MOST WHEN USED AS NEEDED. USE WHEN YOU HAVE BREAKTHROUGH HEARTBURN/REFLUX.  FOLLOW UP IN 1 YEAR.

## 2018-08-29 NOTE — Patient Instructions (Addendum)
SEE DR. Kemp. GOSRANI FOR AN ELEVATED BLOOD SUGAR.  TO CONTROL HEARTBURN:   1. DRINK WATER TO KEEP YOUR URINE LIGHT YELLOW.    2. CONTINUE YOUR WEIGHT LOSS EFFORTS. YOUR BODY MASS INDEX IS OVER 30 WHICH MEANS YOU ARE OBESE. OBESITY IS ASSOCIATED WITH AN INCREASED FOR CIRRHOSIS AND ALL CANCERS, INCLUDING ESOPHAGEAL AND COLON CANCER. A WEIGHT OF 155 LBS OR LESS  WILL GET YOUR BODY MASS INDEX(BMI) UNDER 30.    3. CONTINUE OMEPRAZOLE.  TAKE 30 MINUTES PRIOR TO YOUR FIRST MEAL.    4. PEPCID HELPS MOST WHEN USED AS NEEDED. USE WHEN YOU HAVE BREAKTHROUGH HEARTBURN/REFLUX.   USE PREPARATION H UP 4 TIMES A DAY FOR 7 DAYS THEN AS NEEDED TO CONTROL RECTAL BLEEDING, ITCHING, PRESSURE, BURNING, OR PAIN.   COMPLETE COLONOSCOPY WITH PROPOFOL IN 3-4 WEEKS. YOU MAY BRING THE ENEMA TO ADMINISTER IN THE PREOP AREA.  SEE NEUROLOGY: DR. Merlene Laughter FOR ARM PAIN/NECK AND FOOT PAIN.  FOLLOW UP IN 1 YEAR.

## 2018-09-03 ENCOUNTER — Telehealth: Payer: Self-pay | Admitting: *Deleted

## 2018-09-03 ENCOUNTER — Telehealth: Payer: Self-pay | Admitting: Gastroenterology

## 2018-09-03 NOTE — Telephone Encounter (Signed)
REVIEWED-NO ADDITIONAL RECOMMENDATIONS. 

## 2018-09-03 NOTE — Telephone Encounter (Signed)
[phone] patient returned call, please call back

## 2018-09-03 NOTE — Telephone Encounter (Signed)
Patient called back and is aware. She will call PCP for referral

## 2018-09-03 NOTE — Telephone Encounter (Signed)
Received fax from [place] [facility] that referral needs to come from PCP. Called patient, VM not set up. FYI to [facility]

## 2018-09-03 NOTE — Telephone Encounter (Signed)
See other PN.

## 2018-09-04 ENCOUNTER — Telehealth: Payer: Self-pay | Admitting: *Deleted

## 2018-09-04 NOTE — Telephone Encounter (Addendum)
Called pt, VM not set up. She needs to be scheduled for TCS w/ MAC w/ [facility] dx: RB/polyps

## 2018-09-05 NOTE — Telephone Encounter (Signed)
Called pt, VM not set up. Letter mailed to call back to schedule.

## 2018-09-10 ENCOUNTER — Other Ambulatory Visit: Payer: Self-pay | Admitting: *Deleted

## 2018-09-10 DIAGNOSIS — K625 Hemorrhage of anus and rectum: Secondary | ICD-10-CM

## 2018-09-10 DIAGNOSIS — Z86010 Personal history of colonic polyps: Secondary | ICD-10-CM

## 2018-09-10 MED ORDER — PEG 3350-KCL-NA BICARB-NACL 420 G PO SOLR: 4000.0000 mL | Freq: Once | ORAL | 0 refills | Status: AC

## 2018-09-10 NOTE — Telephone Encounter (Signed)
Patient called back. She is scheduled for 6/2 at 9:30am. Instructions will be mailed with pre-op appt. Orders entered. Prep sent into the pharmacy.

## 2018-09-10 NOTE — Telephone Encounter (Signed)
Called [facility]etter [place] and spoke with Monat at [phone]. Was advised no PA was required for procedure as long as patient is not admitted.

## 2018-09-10 NOTE — Telephone Encounter (Signed)
Pre-op scheduled for 5/26 at 1:45pm. Letter mailed. Called patient, no answer and no VM

## 2018-09-25 ENCOUNTER — Other Ambulatory Visit: Payer: Self-pay

## 2018-09-26 ENCOUNTER — Ambulatory Visit: Payer: PRIVATE HEALTH INSURANCE | Admitting: Family Medicine

## 2018-09-26 ENCOUNTER — Encounter: Payer: Self-pay | Admitting: Family Medicine

## 2018-09-26 VITALS — BP 127/87 | HR 93 | Temp 98.9°F | Ht 61.0 in | Wt 172.0 lb

## 2018-09-26 DIAGNOSIS — M542 Cervicalgia: Secondary | ICD-10-CM

## 2018-09-26 DIAGNOSIS — Z7689 Persons encountering health services in other specified circumstances: Secondary | ICD-10-CM | POA: Diagnosis not present

## 2018-09-26 DIAGNOSIS — R202 Paresthesia of skin: Secondary | ICD-10-CM

## 2018-09-26 DIAGNOSIS — M5030 Other cervical disc degeneration, unspecified cervical region: Secondary | ICD-10-CM

## 2018-09-26 DIAGNOSIS — M5136 Other intervertebral disc degeneration, lumbar region: Secondary | ICD-10-CM

## 2018-09-26 DIAGNOSIS — M549 Dorsalgia, unspecified: Secondary | ICD-10-CM

## 2018-09-26 DIAGNOSIS — F339 Major depressive disorder, recurrent, unspecified: Secondary | ICD-10-CM

## 2018-09-26 DIAGNOSIS — G8929 Other chronic pain: Secondary | ICD-10-CM | POA: Insufficient documentation

## 2018-09-26 MED ORDER — DULOXETINE HCL 30 MG PO CPEP: 30.0000 mg | ORAL_CAPSULE | Freq: Every day | ORAL | 3 refills | Status: DC

## 2018-09-26 NOTE — Patient Instructions (Signed)

## 2018-09-26 NOTE — Progress Notes (Signed)
Subjective:  Patient ID: [name], female    DOB: 05/17/1961, 58 y.o.   MRN: [id number]  Chief Complaint:  Establish Care and Pain in left side of neck radiating into shoulder (causing headaches, numbness in top of left hand and in fingertips)   HPI: [name] is a 58 y.o. female presenting on 09/26/2018 for Establish Care and Pain in left side of neck radiating into shoulder (causing headaches, numbness in top of left hand and in fingertips)   1. Encounter to establish care  Pt presents today to establish care. Pt states her insurance changed and she had to change PCPs. Pt was previously seeing Dr. Cindie Laroche, records have been requested. Pt states she is scheduled for her colonoscopy soon. States she is due for a mammogram and PAP. Pt states she will make an appointment for a CPE.    2. Depression, recurrent ([place])  Pt states this has been ongoing for years. States she took herself off of her antidepressant due to cost. States she has not been on them in several years. Pt states she has daily fatigue, widespread pain, and anhedonia. Denies SI or HI.  Depression screen [facility] 2/9 09/26/2018  Decreased Interest 3  Down, Depressed, Hopeless 3  PHQ - 2 Score 6  Altered sleeping 3  Tired, decreased energy 3  Change in appetite 2  Feeling bad or failure about yourself  1  Trouble concentrating 1  Moving slowly or fidgety/restless 1  Suicidal thoughts 0  PHQ-9 Score 17     3. Chronic neck and back pain  Pt states she has had chronic pain for several years. States her pain has increased over the last year. States she has pain on a daily basis and is managing with prescribed oxycodone. Pt states she has not had imaging in several years. States she does have increased pain in her left cervical spine with paresthesia down her left arm. States her last three digits on her left hand are tingling to numb. She states this has been worsening over the last several months. She denies loss  of function, but does feel she is weaker in her left arm. No know new injury. No other associated symptoms.      Relevant past medical, surgical, family, and social history reviewed and updated as indicated.  Allergies and medications reviewed and updated.   Past Medical History:  Diagnosis Date  . DDD (degenerative disc disease)    spinal injections  . DDD (degenerative disc disease), lumbar   . Diverticular disease   . Horseshoe kidney   . Hypercholesterolemia   . Irritable bowel syndrome     Past Surgical History:  Procedure Laterality Date  . cervical dysplasia    . COLONOSCOPY  07/01/2009   VVO:[id number] seen in the ascending and sigmoid colon/small internal hemorrhoids/6-mm sessile polyp/4-mm sessile polyp.3-mm sessile polyp removed. simple adenomas  . COLONOSCOPY N/A 03/25/2013   Procedure: COLONOSCOPY;  Surgeon: Danie Binder, MD;  [facility];  Service: Endoscopy;  Laterality: N/A;  10:15  . ESOPHAGEAL DILATION N/A 02/04/2015   Procedure: ESOPHAGEAL DILATION;  Surgeon: Danie Binder, MD;  Location: [facility];  Service: Endoscopy;  Laterality: N/A;  . ESOPHAGOGASTRODUODENOSCOPY  07/01/2009   RSW:NIOEVO esophagus/dilation to 16 mm possible proximal cervical web  . ESOPHAGOGASTRODUODENOSCOPY N/A 02/04/2015   Procedure: ESOPHAGOGASTRODUODENOSCOPY (EGD);  Surgeon: Danie Binder, MD;  [facility];  Service: Endoscopy;  Laterality: N/A;  0800-moved to 845 Office  to notify pt  . ESOPHAGOGASTRODUODENOSCOPY (EGD) WITH ESOPHAGEAL DILATION N/A 03/25/2013   Procedure: ESOPHAGOGASTRODUODENOSCOPY (EGD) WITH ESOPHAGEAL DILATION;  Surgeon: Danie Binder, MD;  [facility];  Service: Endoscopy;  Laterality: N/A;  . TONSILLECTOMY    . TUBAL LIGATION    . WRIST SURGERY      Social History   Socioeconomic History  . Marital status: Legally Separated    Spouse name: Not on file  . Number of children: Not on file  . Years of education: Not on  file  . Highest education level: Not on file  Occupational History  . 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 Financial resource strain: Not on file  . Food insecurity:    Worry: Not on file    Inability: Not on file  . Transportation needs:    Medical: Not on file    Non-medical: Not on file  Tobacco Use  . Smoking status: Former Smoker    Packs/day: 1.00    Years: 25.00    Pack years: 25.00    Last attempt to quit: 07/10/1997    Years since quitting: 21.2  . Smokeless tobacco: Never Used  Substance and Sexual Activity  . Alcohol use: No  . Drug use: No  . Sexual activity: Not Currently  Lifestyle  . Physical activity:    Days per week: Not on file    Minutes per session: Not on file  . Stress: Not on file  Relationships  . Social connections:    Talks on phone: Not on file    Gets together: Not on file    Attends religious service: Not on file    Active member of club or organization: Not on file    Attends meetings of clubs or organizations: Not on file    Relationship status: Not on file  . Intimate partner violence:    Fear of current or ex partner: Not on file    Emotionally abused: Not on file    Physically abused: Not on file    Forced sexual activity: Not on file  Other Topics Concern  . Not on file  Social History Narrative  . Not on file    Outpatient Encounter Medications as of 09/26/2018  Medication Sig  . famotidine (PEPCID) 20 MG tablet Take 20 mg by mouth as needed for heartburn or indigestion.  Marland Kitchen oxyCODONE (OXY IR/ROXICODONE) 5 MG immediate release tablet Take 5 mg by mouth 3 (three) times daily.   . simethicone (MYLICON) 932 MG chewable tablet Chew 125 mg by mouth every 6 (six) hours as needed for flatulence.  . [DISCONTINUED] predniSONE (DELTASONE) 10 MG tablet Take 6 tablets day one, 5 tablets day two, 4 tablets day three, 3 tablets day four, 2 tablets day five, then 1 tablet day six  (Patient taking differently: Take 20 mg by mouth 2 (two) times daily. )  . DULoxetine (CYMBALTA) 30 MG capsule Take 1 capsule (30 mg total) by mouth daily for 30 days.  . [DISCONTINUED] acetaminophen (TYLENOL) 500 MG tablet Take 500 mg by mouth every 6 (six) hours as needed for mild pain or moderate pain.  . [DISCONTINUED] Ascorbic Acid (VITAMIN C PO) Take by mouth daily.  . [DISCONTINUED] cyclobenzaprine (FLEXERIL) 5 MG tablet Take 1 tablet (5 mg total) by mouth 3 (three) times daily as needed for muscle spasms. (Patient not taking: Reported on 08/24/2018)  . [  DISCONTINUED] omeprazole (PRILOSEC) 20 MG capsule 1 PO 30 MINS PRIOR TO BREAKFAST.  . [DISCONTINUED] potassium chloride SA (K-DUR,KLOR-CON) 20 MEQ tablet Take 1 tablet (20 mEq total) by mouth 2 (two) times daily. (Patient not taking: Reported on 08/24/2018)   No facility-administered encounter medications on file as of 09/26/2018.     Allergies  Allergen Reactions  . Penicillins Anaphylaxis and Itching    Has patient had a PCN reaction causing immediate rash, facial/tongue/throat swelling, SOB or lightheadedness with hypotension: Yes Has patient had a PCN reaction causing severe rash involving mucus membranes or skin necrosis: No Has patient had a PCN reaction that required hospitalization Yes Has patient had a PCN reaction occurring within the last 10 years: Yes If all of the above answers are "NO", then may proceed with Cephalosporin use.   . Codeine Nausea And Vomiting    Review of Systems  Constitutional: Positive for activity change and fatigue. Negative for appetite change, chills, fever and unexpected weight change.  Eyes: Negative for photophobia and visual disturbance.  Respiratory: Negative for cough, chest tightness and shortness of breath.   Cardiovascular: Negative for chest pain, palpitations and leg swelling.  Gastrointestinal: Positive for abdominal pain (Intermittent, IBS). Negative for constipation, nausea and  vomiting.  Endocrine: Negative for polydipsia, polyphagia and polyuria.  Genitourinary: Negative for difficulty urinating and urgency.  Musculoskeletal: Positive for arthralgias, back pain and neck pain. Negative for gait problem, joint swelling, myalgias and neck stiffness.  Neurological: Positive for weakness (left arm) and numbness (left 3rd-5th digits ). Negative for dizziness, tremors, seizures, syncope, facial asymmetry, speech difficulty, light-headedness and headaches.  Hematological: Does not bruise/bleed easily.  Psychiatric/Behavioral: Positive for decreased concentration, dysphoric mood and sleep disturbance. Negative for agitation, behavioral problems, confusion, hallucinations, self-injury and suicidal ideas. The patient is not nervous/anxious and is not hyperactive.   All other systems reviewed and are negative.       Objective:  BP 127/87   Pulse 93   Temp 98.9 F (37.2 C) (Oral)   Ht 5\' 1"  (1.549 m)   Wt 172 lb (78 kg)   BMI 32.50 kg/m    Wt Readings from Last 3 Encounters:  09/26/18 172 lb (78 kg)  08/29/18 180 lb 9.6 oz (81.9 kg)  08/24/18 180 lb (81.6 kg)    Physical Exam Vitals signs and nursing note reviewed.  Constitutional:      General: She is not in acute distress.    Appearance: Normal appearance. She is well-developed, well-groomed and overweight. She is not ill-appearing or toxic-appearing.  HENT:     Head: Normocephalic and atraumatic.     Jaw: There is normal jaw occlusion.     Right Ear: Hearing, tympanic membrane, ear canal and external ear normal.     Left Ear: Hearing, tympanic membrane, ear canal and external ear normal.     Nose: Nose normal.     Mouth/Throat:     Lips: Pink.     Mouth: Mucous membranes are moist.     Pharynx: Oropharynx is clear. No oropharyngeal exudate or posterior oropharyngeal erythema.  Eyes:     General: Lids are normal.     Extraocular Movements: Extraocular movements intact.     Conjunctiva/sclera:  Conjunctivae normal.     Pupils: Pupils are equal, round, and reactive to light.  Neck:     Musculoskeletal: Full passive range of motion without pain, normal range of motion and neck supple. Normal range of motion. No edema, erythema, neck rigidity, crepitus, injury,  pain with movement, torticollis, spinous process tenderness or muscular tenderness.     Thyroid: No thyroid mass, thyromegaly or thyroid tenderness.     Vascular: No carotid bruit.     Trachea: Trachea and phonation normal.  Cardiovascular:     Rate and Rhythm: Normal rate and regular rhythm.     Pulses: Normal pulses.     Heart sounds: Normal heart sounds. No murmur. No friction rub. No gallop.   Pulmonary:     Effort: Pulmonary effort is normal. No respiratory distress.     Breath sounds: Normal breath sounds.  Abdominal:     General: Bowel sounds are normal.     Palpations: Abdomen is soft.  Musculoskeletal:     Left shoulder: Normal.     Left elbow: Normal.     Left wrist: Normal.     Cervical back: Normal.     Left hand: Normal.     Comments: Strength 5/5 in all extremities  Lymphadenopathy:     Cervical: No cervical adenopathy.  Skin:    General: Skin is warm and dry.     Capillary Refill: Capillary refill takes less than 2 seconds.  Neurological:     General: No focal deficit present.     Mental Status: She is alert and oriented to person, place, and time.     Cranial Nerves: Cranial nerves are intact.     Sensory: Sensation is intact.     Motor: Motor function is intact.     Coordination: Coordination is intact.     Gait: Gait is intact.     Deep Tendon Reflexes: Reflexes are normal and symmetric.  Psychiatric:        Attention and Perception: Attention and perception normal.        Mood and Affect: Mood and affect normal.        Speech: Speech normal.        Behavior: Behavior normal. Behavior is cooperative.        Thought Content: Thought content normal. Thought content is not paranoid or  delusional. Thought content does not include homicidal or suicidal ideation. Thought content does not include homicidal or suicidal plan.        Cognition and Memory: Cognition and memory normal.        Judgment: Judgment normal.     Results for orders placed or performed during the hospital encounter of 08/24/18  CBC with Differential/Platelet  Result Value Ref Range   WBC 12.8 (H) 4.0 - 10.5 K/uL   RBC 4.07 3.87 - 5.11 MIL/uL   Hemoglobin 11.9 (L) 12.0 - 15.0 g/dL   HCT 37.2 36.0 - 46.0 %   MCV 91.4 80.0 - 100.0 fL   MCH 29.2 26.0 - 34.0 pg   MCHC 32.0 30.0 - 36.0 g/dL   RDW 14.5 11.5 - 15.5 %   Platelets 204 150 - 400 K/uL   nRBC 0.2 0.0 - 0.2 %   Neutrophils Relative % 75 %   Neutro Abs 9.7 (H) 1.7 - 7.7 K/uL   Lymphocytes Relative 16 %   Lymphs Abs 2.1 0.7 - 4.0 K/uL   Monocytes Relative 6 %   Monocytes Absolute 0.7 0.1 - 1.0 K/uL   Eosinophils Relative 1 %   Eosinophils Absolute 0.1 0.0 - 0.5 K/uL   Basophils Relative 0 %   Basophils Absolute 0.0 0.0 - 0.1 K/uL   Immature Granulocytes 2 %   Abs Immature Granulocytes 0.24 (H) 0.00 - 0.07 K/uL  Comprehensive  metabolic panel  Result Value Ref Range   Sodium 129 (L) 135 - 145 mmol/L   Potassium 3.4 (L) 3.5 - 5.1 mmol/L   Chloride 100 98 - 111 mmol/L   CO2 17 (L) 22 - 32 mmol/L   Glucose, Bld 295 (H) 70 - 99 mg/dL   BUN 8 6 - 20 mg/dL   Creatinine, Ser 0.87 0.44 - 1.00 mg/dL   Calcium 8.4 (L) 8.9 - 10.3 mg/dL   Total Protein 6.3 (L) 6.5 - 8.1 g/dL   Albumin 3.7 3.5 - 5.0 g/dL   AST 26 15 - 41 U/L   ALT 27 0 - 44 U/L   Alkaline Phosphatase 80 38 - 126 U/L   Total Bilirubin 0.7 0.3 - 1.2 mg/dL   GFR calc non Af Amer >60 >60 mL/min   GFR calc Af Amer >60 >60 mL/min   Anion gap 12 5 - 15  Troponin I - ONCE - STAT  Result Value Ref Range   Troponin I <0.03 <0.03 ng/mL  Urinalysis, Routine w reflex microscopic  Result Value Ref Range   Color, Urine STRAW (A) YELLOW   APPearance CLEAR CLEAR   Specific Gravity, Urine  1.020 1.005 - 1.030   pH 6.0 5.0 - 8.0   Glucose, UA >=500 (A) NEGATIVE mg/dL   Hgb urine dipstick NEGATIVE NEGATIVE   Bilirubin Urine NEGATIVE NEGATIVE   Ketones, ur NEGATIVE NEGATIVE mg/dL   Protein, ur NEGATIVE NEGATIVE mg/dL   Nitrite NEGATIVE NEGATIVE   Leukocytes,Ua NEGATIVE NEGATIVE   RBC / HPF 0-5 0 - 5 RBC/hpf   WBC, UA 0-5 0 - 5 WBC/hpf   Bacteria, UA NONE SEEN NONE SEEN   Squamous Epithelial / LPF 0-5 0 - 5   Mucus PRESENT   Rapid urine drug screen (hospital performed)  Result Value Ref Range   Opiates NONE DETECTED NONE DETECTED   Cocaine NONE DETECTED NONE DETECTED   Benzodiazepines NONE DETECTED NONE DETECTED   Amphetamines NONE DETECTED NONE DETECTED   Tetrahydrocannabinol NONE DETECTED NONE DETECTED   Barbiturates NONE DETECTED NONE DETECTED  Type and screen [place]  Result Value Ref Range   ABO/RH(D) O POS    Antibody Screen NEG    Sample Expiration      08/27/2018 Performed at [facility], [address], [place], [place] [address]   ABO/Rh  Result Value Ref Range   ABO/RH(D)      O POS Performed at [facility] [address] [place], [place] [address]        Pertinent labs & imaging results that were available during my care of the patient were reviewed by me and considered in my medical decision making.  Assessment & Plan:  Dagny was seen today for establish care and pain in left side of neck radiating into shoulder.  Diagnoses and all orders for this visit:  Encounter to establish care  Depression, recurrent ([place]) Due to ongoing depression and chronic widespread pain, will trial cymbalta for dual benefit. Report any new or worsening symptoms.  -     DULoxetine (CYMBALTA) 30 MG capsule; Take 1 capsule (30 mg total) by mouth daily for 30 days.  Chronic neck and back pain Due to ongoing pain will refer to pain management. Will initiate cymbalta for depression and widespread pain.  -     DULoxetine  (CYMBALTA) 30 MG capsule; Take 1 capsule (30 mg total) by mouth daily for 30 days. -  Ambulatory referral to Pain Clinic  Paresthesia of arm Due to new radiculopathy, will order an MRI and make referral once resulted. Report any new or worsening symptoms.  -     MR Cervical Spine Wo Contrast; Future  DDD (degenerative disc disease), cervical -     MR Cervical Spine Wo Contrast; Future -     Ambulatory referral to Pain Clinic  DDD (degenerative disc disease), lumbar -     Ambulatory referral to Pain Clinic  Cervicalgia -     MR Cervical Spine Wo Contrast; Future     Continue all other maintenance medications.  Follow up plan: Return if symptoms worsen or fail to improve, for CPE.  Educational handout given for chronic pain  The above assessment and management plan was discussed with the patient. The patient verbalized understanding of and has agreed to the management plan. Patient is aware to call the clinic if symptoms persist or worsen. Patient is aware when to return to the clinic for a follow-up visit. Patient educated on when it is appropriate to go to the emergency department.   Monia Pouch, FNP-C [facility] [phone]

## 2018-09-27 ENCOUNTER — Other Ambulatory Visit: Payer: Self-pay | Admitting: Family Medicine

## 2018-09-27 DIAGNOSIS — M5030 Other cervical disc degeneration, unspecified cervical region: Secondary | ICD-10-CM

## 2018-09-27 DIAGNOSIS — M5136 Other intervertebral disc degeneration, lumbar region: Secondary | ICD-10-CM

## 2018-09-27 DIAGNOSIS — M79601 Pain in right arm: Secondary | ICD-10-CM

## 2018-09-27 DIAGNOSIS — M542 Cervicalgia: Secondary | ICD-10-CM

## 2018-10-17 ENCOUNTER — Ambulatory Visit: Payer: PRIVATE HEALTH INSURANCE | Admitting: Family Medicine

## 2018-10-17 ENCOUNTER — Other Ambulatory Visit: Payer: Self-pay

## 2018-10-17 ENCOUNTER — Encounter: Payer: Self-pay | Admitting: Family Medicine

## 2018-10-17 ENCOUNTER — Telehealth: Payer: Self-pay | Admitting: Cardiology

## 2018-10-17 VITALS — BP 129/78 | HR 88 | Temp 98.0°F | Ht 61.0 in | Wt 176.0 lb

## 2018-10-17 DIAGNOSIS — R5383 Other fatigue: Secondary | ICD-10-CM | POA: Diagnosis not present

## 2018-10-17 DIAGNOSIS — E1165 Type 2 diabetes mellitus with hyperglycemia: Secondary | ICD-10-CM | POA: Insufficient documentation

## 2018-10-17 DIAGNOSIS — R600 Localized edema: Secondary | ICD-10-CM

## 2018-10-17 DIAGNOSIS — R9431 Abnormal electrocardiogram [ECG] [EKG]: Secondary | ICD-10-CM

## 2018-10-17 DIAGNOSIS — R0601 Orthopnea: Secondary | ICD-10-CM

## 2018-10-17 DIAGNOSIS — R0609 Other forms of dyspnea: Secondary | ICD-10-CM | POA: Diagnosis not present

## 2018-10-17 DIAGNOSIS — R7309 Other abnormal glucose: Secondary | ICD-10-CM

## 2018-10-17 LAB — BAYER DCA HB A1C WAIVED: HB A1C (BAYER DCA - WAIVED): 7.9 % — ABNORMAL HIGH (ref <7.0)

## 2018-10-17 NOTE — Patient Instructions (Addendum)
Diabetes Mellitus and Nutrition, Adult When you have diabetes (diabetes mellitus), it is very important to have healthy eating habits because your blood sugar (glucose) levels are greatly affected by what you eat and drink. Eating healthy foods in the appropriate amounts, at about the same times every day, can help you:  Control your blood glucose.  Lower your risk of heart disease.  Improve your blood pressure.  Reach or maintain a healthy weight. Every person with diabetes is different, and each person has different needs for a meal plan. Your health care provider may recommend that you work with a diet and nutrition specialist (dietitian) to make a meal plan that is best for you. Your meal plan may vary depending on factors such as:  The calories you need.  The medicines you take.  Your weight.  Your blood glucose, blood pressure, and cholesterol levels.  Your activity level.  Other health conditions you have, such as heart or kidney disease. How do carbohydrates affect me? Carbohydrates, also called carbs, affect your blood glucose level more than any other type of food. Eating carbs naturally raises the amount of glucose in your blood. Carb counting is a method for keeping track of how many carbs you eat. Counting carbs is important to keep your blood glucose at a healthy level, especially if you use insulin or take certain oral diabetes medicines. It is important to know how many carbs you can safely have in each meal. This is different for every person. Your dietitian can help you calculate how many carbs you should have at each meal and for each snack. Foods that contain carbs include:  Bread, cereal, rice, pasta, and crackers.  Potatoes and corn.  Peas, beans, and lentils.  Milk and yogurt.  Fruit and juice.  Desserts, such as cakes, cookies, ice cream, and candy. How does alcohol affect me? Alcohol can cause a sudden decrease in blood glucose (hypoglycemia),  especially if you use insulin or take certain oral diabetes medicines. Hypoglycemia can be a life-threatening condition. Symptoms of hypoglycemia (sleepiness, dizziness, and confusion) are similar to symptoms of having too much alcohol. If your health care provider says that alcohol is safe for you, follow these guidelines:  Limit alcohol intake to no more than 1 drink per day for nonpregnant women and 2 drinks per day for men. One drink equals 12 oz of beer, 5 oz of wine, or 1 oz of hard liquor.  Do not drink on an empty stomach.  Keep yourself hydrated with water, diet soda, or unsweetened iced tea.  Keep in mind that regular soda, juice, and other mixers may contain a lot of sugar and must be counted as carbs. What are tips for following this plan?  Reading food labels  Start by checking the serving size on the "Nutrition Facts" label of packaged foods and drinks. The amount of calories, carbs, fats, and other nutrients listed on the label is based on one serving of the item. Many items contain more than one serving per package.  Check the total grams (g) of carbs in one serving. You can calculate the number of servings of carbs in one serving by dividing the total carbs by 15. For example, if a food has 30 g of total carbs, it would be equal to 2 servings of carbs.  Check the number of grams (g) of saturated and trans fats in one serving. Choose foods that have low or no amount of these fats.  Check the number of   milligrams (mg) of salt (sodium) in one serving. Most people should limit total sodium intake to less than 2,300 mg per day.  Always check the nutrition information of foods labeled as "low-fat" or "nonfat". These foods may be higher in added sugar or refined carbs and should be avoided.  Talk to your dietitian to identify your daily goals for nutrients listed on the label. Shopping  Avoid buying canned, premade, or processed foods. These foods tend to be high in fat, sodium,  and added sugar.  Shop around the outside edge of the grocery store. This includes fresh fruits and vegetables, bulk grains, fresh meats, and fresh dairy. Cooking  Use low-heat cooking methods, such as baking, instead of high-heat cooking methods like deep frying.  Cook using healthy oils, such as olive, canola, or sunflower oil.  Avoid cooking with butter, cream, or high-fat meats. Meal planning  Eat meals and snacks regularly, preferably at the same times every day. Avoid going long periods of time without eating.  Eat foods high in fiber, such as fresh fruits, vegetables, beans, and whole grains. Talk to your dietitian about how many servings of carbs you can eat at each meal.  Eat 4-6 ounces (oz) of lean protein each day, such as lean meat, chicken, fish, eggs, or tofu. One oz of lean protein is equal to: ? 1 oz of meat, chicken, or fish. ? 1 egg. ?  cup of tofu.  Eat some foods each day that contain healthy fats, such as avocado, nuts, seeds, and fish. Lifestyle  Check your blood glucose regularly.  Exercise regularly as told by your health care provider. This may include: ? 150 minutes of moderate-intensity or vigorous-intensity exercise each week. This could be brisk walking, biking, or water aerobics. ? Stretching and doing strength exercises, such as yoga or weightlifting, at least 2 times a week.  Take medicines as told by your health care provider.  Do not use any products that contain nicotine or tobacco, such as cigarettes and e-cigarettes. If you need help quitting, ask your health care provider.  Work with a Social worker or diabetes educator to identify strategies to manage stress and any emotional and social challenges. Questions to ask a health care provider  Do I need to meet with a diabetes educator?  Do I need to meet with a dietitian?  What number can I call if I have questions?  When are the best times to check my blood glucose? Where to find more  information:  American Diabetes Association: diabetes.org  Academy of Nutrition and Dietetics: www.eatright.[employer] of Diabetes and Digestive and Kidney Diseases (NIH): [employer] Summary  A healthy meal plan will help you control your blood glucose and maintain a healthy lifestyle.  Working with a diet and nutrition specialist (dietitian) can help you make a meal plan that is best for you.  Keep in mind that carbohydrates (carbs) and alcohol have immediate effects on your blood glucose levels. It is important to count carbs and to use alcohol carefully. This information is not intended to replace advice given to you by your health care provider. Make sure you discuss any questions you have with your health care provider. Document Released: 03/23/2005 Document Revised: 01/24/2017 Document Reviewed: 07/31/2016 Elsevier Interactive Patient Education  2019 [employer] Edema  Edema is when you have too much fluid in your body or under your skin. Edema may make your legs, feet, and ankles swell up. Swelling is also common in looser tissues, like  around your eyes. This is a common condition. It gets more common as you get older. There are many possible causes of edema. Eating too much salt (sodium) and being on your feet or sitting for a long time can cause edema in your legs, feet, and ankles. Hot weather may make edema worse. Edema is usually painless. Your skin may look swollen or shiny. Follow these instructions at home:  Keep the swollen body part raised (elevated) above the level of your heart when you are sitting or lying down.  Do not sit still or stand for a long time.  Do not wear tight clothes. Do not wear garters on your upper legs.  Exercise your legs. This can help the swelling go down.  Wear elastic bandages or support stockings as told by your doctor.  Eat a low-salt (low-sodium) diet to reduce fluid as told by your doctor.  Depending on the  cause of your swelling, you may need to limit how much fluid you drink (fluid restriction).  Take over-the-counter and prescription medicines only as told by your doctor. Contact a doctor if:  Treatment is not working.  You have heart, liver, or kidney disease and have symptoms of edema.  You have sudden and unexplained weight gain. Get help right away if:  You have shortness of breath or chest pain.  You cannot breathe when you lie down.  You have pain, redness, or warmth in the swollen areas.  You have heart, liver, or kidney disease and get edema all of a sudden.  You have a fever and your symptoms get worse all of a sudden. Summary  Edema is when you have too much fluid in your body or under your skin.  Edema may make your legs, feet, and ankles swell up. Swelling is also common in looser tissues, like around your eyes.  Raise (elevate) the swollen body part above the level of your heart when you are sitting or lying down.  Follow your doctor's instructions about diet and how much fluid you can drink (fluid restriction). This information is not intended to replace advice given to you by your health care provider. Make sure you discuss any questions you have with your health care provider. Document Released: 12/13/2007 Document Revised: 07/14/2016 Document Reviewed: 07/14/2016 Elsevier Interactive Patient Education  2019 [employer].

## 2018-10-17 NOTE — Telephone Encounter (Signed)
° ° ° ° °  TELEPHONE CALL NOTE  JESENIA SPERA has been deemed a candidate for a follow-up tele-health visit to limit community exposure during the Covid-19 pandemic. I spoke with the patient via phone to ensure availability of phone/video source, confirm preferred email & phone number, and discuss instructions and expectations.  I reminded Micheline Markes Kneale to be prepared with any vital sign and/or heart rhythm information that could potentially be obtained via home monitoring, at the time of her visit. I reminded MAKYNLEIGH BRESLIN to expect a phone call at the time of her visit if her visit.  Did the patient verbally acknowledge consent to treatment? yes  Weston Anna 10/17/2018 12:23 PM

## 2018-10-17 NOTE — Progress Notes (Signed)
Subjective:  Patient ID: [name], female    DOB: 04-Sep-1960, 58 y.o.   MRN: [id number]  Chief Complaint:  bilateral foot swelling (Took fasting BS this AM and it ws 227) and Facial Swelling   HPI: [name] is a 58 y.o. female presenting on 10/17/2018 for bilateral foot swelling (Took fasting BS this AM and it ws 227) and Facial Swelling  Pt presents today with complaints of lower extremity edema, elevated blood sugar, exertional dyspnea, palpitations, and orthopnea. Pt states she started noticing these symptoms about a month ago. States it started with slight lower extremity swelling. States the swelling kept getting worse. States she then developed exertional dyspnea. States she will walk across the parking lot and have to stop to catch her breath. States she does have palpitations at times with the shortness of breath. She denies chest pain or dizziness. No syncope or weakness. States she has to prop herself on two pillows at night to sleep. She excessive salt intake. States she props her feet up when home and the swelling does not go away. She states she thought this might be related to elevated blood sugar, states she checked her blood sugar today and it was 227. She denies polyuria, polydipsia, or polyphagia. No visual changes. No other associated symptoms.   Relevant past medical, surgical, family, and social history reviewed and updated as indicated.  Allergies and medications reviewed and updated.   Past Medical History:  Diagnosis Date  . DDD (degenerative disc disease)    spinal injections  . DDD (degenerative disc disease), lumbar   . Diverticular disease   . Horseshoe kidney   . Hypercholesterolemia   . Irritable bowel syndrome     Past Surgical History:  Procedure Laterality Date  . cervical dysplasia    . COLONOSCOPY  07/01/2009   [id number] seen in the ascending and sigmoid colon/small internal hemorrhoids/6-mm sessile polyp/4-mm sessile  polyp.3-mm sessile polyp removed. simple adenomas  . COLONOSCOPY N/A 03/25/2013   Procedure: COLONOSCOPY;  Surgeon: Danie Binder, MD;  [facility];  Service: Endoscopy;  Laterality: N/A;  10:15  . ESOPHAGEAL DILATION N/A 02/04/2015   Procedure: ESOPHAGEAL DILATION;  Surgeon: Danie Binder, MD;  [facility];  Service: Endoscopy;  Laterality: N/A;  . ESOPHAGOGASTRODUODENOSCOPY  07/01/2009   [id number] esophagus/dilation to 16 mm possible proximal cervical web  . ESOPHAGOGASTRODUODENOSCOPY N/A 02/04/2015   Procedure: ESOPHAGOGASTRODUODENOSCOPY (EGD);  Surgeon: Danie Binder, MD;  [facility];  Service: Endoscopy;  Laterality: N/A;  0800-moved to 845 Office to notify pt  . ESOPHAGOGASTRODUODENOSCOPY (EGD) WITH ESOPHAGEAL DILATION N/A 03/25/2013   Procedure: ESOPHAGOGASTRODUODENOSCOPY (EGD) WITH ESOPHAGEAL DILATION;  Surgeon: Danie Binder, MD;  [facility];  Service: Endoscopy;  Laterality: N/A;  . TONSILLECTOMY    . TUBAL LIGATION    . WRIST SURGERY      Social History   Socioeconomic History  . Marital status: Legally Separated    Spouse name: Not on file  . Number of children: Not on file  . Years of education: Not on file  . Highest education level: Not on file  Occupational History  . 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 Financial resource strain: Not on file  . Food insecurity:    Worry: Not on file    Inability: Not on file  . Transportation needs:  Medical: Not on file    Non-medical: Not on file  Tobacco Use  . Smoking status: Former Smoker    Packs/day: 1.00    Years: 25.00    Pack years: 25.00    Last attempt to quit: 07/10/1997    Years since quitting: 21.2  . Smokeless tobacco: Never Used  Substance and Sexual Activity  . Alcohol use: No  . Drug use: No  . Sexual activity: Not Currently  Lifestyle  . Physical activity:    Days per week: Not on  file    Minutes per session: Not on file  . Stress: Not on file  Relationships  . Social connections:    Talks on phone: Not on file    Gets together: Not on file    Attends religious service: Not on file    Active member of club or organization: Not on file    Attends meetings of clubs or organizations: Not on file    Relationship status: Not on file  . Intimate partner violence:    Fear of current or ex partner: Not on file    Emotionally abused: Not on file    Physically abused: Not on file    Forced sexual activity: Not on file  Other Topics Concern  . Not on file  Social History Narrative  . Not on file    Outpatient Encounter Medications as of 10/17/2018  Medication Sig  . DULoxetine (CYMBALTA) 30 MG capsule Take 1 capsule (30 mg total) by mouth daily for 30 days. (Patient not taking: Reported on 10/17/2018)  . oxyCODONE (OXY IR/ROXICODONE) 5 MG immediate release tablet Take 5 mg by mouth 3 (three) times daily.   . simethicone (MYLICON) 621 MG chewable tablet Chew 125 mg by mouth every 6 (six) hours as needed for flatulence.  . [DISCONTINUED] famotidine (PEPCID) 20 MG tablet Take 20 mg by mouth as needed for heartburn or indigestion.   No facility-administered encounter medications on file as of 10/17/2018.     Allergies  Allergen Reactions  . Penicillins Anaphylaxis and Itching    Has patient had a PCN reaction causing immediate rash, facial/tongue/throat swelling, SOB or lightheadedness with hypotension: Yes Has patient had a PCN reaction causing severe rash involving mucus membranes or skin necrosis: No Has patient had a PCN reaction that required hospitalization Yes Has patient had a PCN reaction occurring within the last 10 years: Yes If all of the above answers are "NO", then may proceed with Cephalosporin use.   . Codeine Nausea And Vomiting    Review of Systems  Constitutional: Positive for fatigue and unexpected weight change. Negative for activity change,  appetite change, chills, diaphoresis and fever.  Eyes: Negative for photophobia and visual disturbance.  Respiratory: Positive for cough and shortness of breath. Negative for chest tightness and wheezing.   Cardiovascular: Positive for palpitations and leg swelling. Negative for chest pain.  Gastrointestinal: Negative for abdominal distention, abdominal pain, anal bleeding, blood in stool, constipation, diarrhea, nausea, rectal pain and vomiting.  Endocrine: Negative for cold intolerance, heat intolerance, polydipsia, polyphagia and polyuria.  Genitourinary: Negative for decreased urine volume, difficulty urinating and frequency.  Musculoskeletal: Negative for arthralgias, joint swelling and myalgias.  Skin: Negative for color change, pallor and rash.  Neurological: Negative for dizziness, tremors, seizures, syncope, facial asymmetry, speech difficulty, weakness, light-headedness, numbness and headaches.  Hematological: Negative for adenopathy. Does not bruise/bleed easily.  Psychiatric/Behavioral: Negative for confusion. The patient is not nervous/anxious.   All other systems reviewed  and are negative.       Objective:  BP 129/78   Pulse 88   Temp 98 F (36.7 C) (Oral)   Ht 5' 1"  (1.549 m)   Wt 176 lb (79.8 kg)   SpO2 98%   BMI 33.25 kg/m    Wt Readings from Last 3 Encounters:  10/17/18 176 lb (79.8 kg)  09/26/18 172 lb (78 kg)  08/29/18 180 lb 9.6 oz (81.9 kg)    Physical Exam Vitals signs and nursing note reviewed.  Constitutional:      General: She is not in acute distress.    Appearance: Normal appearance. She is well-developed and well-groomed. She is not ill-appearing or toxic-appearing.  HENT:     Head: Normocephalic and atraumatic.     Right Ear: Tympanic membrane, ear canal and external ear normal.     Left Ear: Tympanic membrane, ear canal and external ear normal.     Nose: Nose normal.     Mouth/Throat:     Lips: Pink.     Mouth: Mucous membranes are moist.      Pharynx: Oropharynx is clear. Uvula midline.  Eyes:     General: Lids are normal.     Extraocular Movements: Extraocular movements intact.     Conjunctiva/sclera: Conjunctivae normal.     Pupils: Pupils are equal, round, and reactive to light.  Neck:     Musculoskeletal: Full passive range of motion without pain and neck supple.     Thyroid: No thyroid mass, thyromegaly or thyroid tenderness.     Vascular: No carotid bruit or JVD.     Trachea: Trachea and phonation normal.  Cardiovascular:     Rate and Rhythm: Normal rate and regular rhythm.     Chest Wall: PMI is not displaced.     Pulses: Normal pulses.     Heart sounds: Normal heart sounds. No murmur. No friction rub. No gallop. No S3 sounds.   Pulmonary:     Effort: Pulmonary effort is normal.     Breath sounds: Normal breath sounds. No wheezing, rhonchi or rales.  Abdominal:     General: Bowel sounds are normal. There is no distension or abdominal bruit.     Palpations: Abdomen is soft.     Tenderness: There is no abdominal tenderness.  Musculoskeletal:     Right lower leg: 2+ Edema present.     Left lower leg: 2+ Edema present.  Lymphadenopathy:     Cervical: No cervical adenopathy.  Skin:    General: Skin is warm and dry.     Capillary Refill: Capillary refill takes less than 2 seconds.  Neurological:     General: No focal deficit present.     Mental Status: She is alert and oriented to person, place, and time.     Cranial Nerves: No cranial nerve deficit.     Sensory: No sensory deficit.     Motor: No weakness.     Coordination: Coordination normal.     Gait: Gait normal.     Deep Tendon Reflexes: Reflexes normal.  Psychiatric:        Mood and Affect: Mood normal.        Behavior: Behavior normal. Behavior is cooperative.        Thought Content: Thought content normal.        Judgment: Judgment normal.     Results for orders placed or performed during the hospital encounter of 08/24/18  CBC with  Differential/Platelet  Result Value Ref Range  WBC 12.8 (H) 4.0 - 10.5 K/uL   RBC 4.07 3.87 - 5.11 MIL/uL   Hemoglobin 11.9 (L) 12.0 - 15.0 g/dL   HCT 37.2 36.0 - 46.0 %   MCV 91.4 80.0 - 100.0 fL   MCH 29.2 26.0 - 34.0 pg   MCHC 32.0 30.0 - 36.0 g/dL   RDW 14.5 11.5 - 15.5 %   Platelets 204 150 - 400 K/uL   nRBC 0.2 0.0 - 0.2 %   Neutrophils Relative % 75 %   Neutro Abs 9.7 (H) 1.7 - 7.7 K/uL   Lymphocytes Relative 16 %   Lymphs Abs 2.1 0.7 - 4.0 K/uL   Monocytes Relative 6 %   Monocytes Absolute 0.7 0.1 - 1.0 K/uL   Eosinophils Relative 1 %   Eosinophils Absolute 0.1 0.0 - 0.5 K/uL   Basophils Relative 0 %   Basophils Absolute 0.0 0.0 - 0.1 K/uL   Immature Granulocytes 2 %   Abs Immature Granulocytes 0.24 (H) 0.00 - 0.07 K/uL  Comprehensive metabolic panel  Result Value Ref Range   Sodium 129 (L) 135 - 145 mmol/L   Potassium 3.4 (L) 3.5 - 5.1 mmol/L   Chloride 100 98 - 111 mmol/L   CO2 17 (L) 22 - 32 mmol/L   Glucose, Bld 295 (H) 70 - 99 mg/dL   BUN 8 6 - 20 mg/dL   Creatinine, Ser 0.87 0.44 - 1.00 mg/dL   Calcium 8.4 (L) 8.9 - 10.3 mg/dL   Total Protein 6.3 (L) 6.5 - 8.1 g/dL   Albumin 3.7 3.5 - 5.0 g/dL   AST 26 15 - 41 U/L   ALT 27 0 - 44 U/L   Alkaline Phosphatase 80 38 - 126 U/L   Total Bilirubin 0.7 0.3 - 1.2 mg/dL   GFR calc non Af Amer >60 >60 mL/min   GFR calc Af Amer >60 >60 mL/min   Anion gap 12 5 - 15  Troponin I - ONCE - STAT  Result Value Ref Range   Troponin I <0.03 <0.03 ng/mL  Urinalysis, Routine w reflex microscopic  Result Value Ref Range   Color, Urine STRAW (A) YELLOW   APPearance CLEAR CLEAR   Specific Gravity, Urine 1.020 1.005 - 1.030   pH 6.0 5.0 - 8.0   Glucose, UA >=500 (A) NEGATIVE mg/dL   Hgb urine dipstick NEGATIVE NEGATIVE   Bilirubin Urine NEGATIVE NEGATIVE   Ketones, ur NEGATIVE NEGATIVE mg/dL   Protein, ur NEGATIVE NEGATIVE mg/dL   Nitrite NEGATIVE NEGATIVE   Leukocytes,Ua NEGATIVE NEGATIVE   RBC / HPF 0-5 0 - 5 RBC/hpf    WBC, UA 0-5 0 - 5 WBC/hpf   Bacteria, UA NONE SEEN NONE SEEN   Squamous Epithelial / LPF 0-5 0 - 5   Mucus PRESENT   Rapid urine drug screen (hospital performed)  Result Value Ref Range   Opiates NONE DETECTED NONE DETECTED   Cocaine NONE DETECTED NONE DETECTED   Benzodiazepines NONE DETECTED NONE DETECTED   Amphetamines NONE DETECTED NONE DETECTED   Tetrahydrocannabinol NONE DETECTED NONE DETECTED   Barbiturates NONE DETECTED NONE DETECTED  Type and screen [place]  Result Value Ref Range   ABO/RH(D) O POS    Antibody Screen NEG    Sample Expiration      08/27/2018 Performed at [facility], [address] [place], [place] [address]   ABO/Rh  Result Value Ref Range   ABO/RH(D)      O POS Performed  at [facility], [place] [address] [place], [place] [address]        Pertinent labs & imaging results that were available during my care of the patient were reviewed by me and considered in my medical decision making.  Assessment & Plan:  Francena was seen today for bilateral foot swelling and facial swelling.  Diagnoses and all orders for this visit:  Lower extremity edema Limit sodium intake. Prop legs when able. Report any new or worsening symptoms. If renal function and electrolytes are normal, will send in diuretic.  -     EKG 12-Lead -     Brain natriuretic peptide -     CBC with Differential/Platelet -     CMP14+EGFR -     TSH -     Ambulatory referral to Cardiology  Orthopnea Labs pending. Report any new or worsening symptoms.  -     EKG 12-Lead -     Brain natriuretic peptide -     CMP14+EGFR -     Ambulatory referral to Cardiology  Dyspnea on exertion Report any new or worsening symptoms.  -     Brain natriuretic peptide -     CBC with Differential/Platelet -     CMP14+EGFR -     Ambulatory referral to Cardiology  Other fatigue Labs pending.  -     CBC with Differential/Platelet -     CMP14+EGFR -     TSH -      Ambulatory referral to Cardiology  Elevated glucose A1C 7.9 in office today, will initiate metformin once renal function has resulted.  -     Bayer DCA Hb A1c Waived  Abnormal EKG EKG in office: SR with no ectopy or ST elevation, old anterior infarct. Urgent referral to cardiology made. No chest pain reported. Pt aware if she develops any chest pain or concerning symptoms, she needs to go to the ED for further evaluation.  -     Ambulatory referral to Cardiology     Continue all other maintenance medications.   Total time spent with patient 45 mintues.  Greater than 50% of encounter spent in coordination of care/counseling.  Follow up plan: Return in about 1 week (around 10/24/2018) for edema, elevated glucose.  Educational handout given for edema, diabetes and nutritioin  The above assessment and management plan was discussed with the patient. The patient verbalized understanding of and has agreed to the management plan. Patient is aware to call the clinic if symptoms persist or worsen. Patient is aware when to return to the clinic for a follow-up visit. Patient educated on when it is appropriate to go to the emergency department.   Monia Pouch, FNP-C [facility] [phone]

## 2018-10-18 ENCOUNTER — Other Ambulatory Visit: Payer: Self-pay | Admitting: Family Medicine

## 2018-10-18 DIAGNOSIS — E876 Hypokalemia: Secondary | ICD-10-CM

## 2018-10-18 DIAGNOSIS — E1165 Type 2 diabetes mellitus with hyperglycemia: Secondary | ICD-10-CM

## 2018-10-18 DIAGNOSIS — R600 Localized edema: Secondary | ICD-10-CM

## 2018-10-18 LAB — CMP14+EGFR
ALT: 30 IU/L (ref 0–32)
AST: 33 IU/L (ref 0–40)
Albumin/Globulin Ratio: 1.9 (ref 1.2–2.2)
Albumin: 3.9 g/dL (ref 3.8–4.9)
Alkaline Phosphatase: 92 IU/L (ref 39–117)
BUN/Creatinine Ratio: 4 — ABNORMAL LOW (ref 9–23)
BUN: 3 mg/dL — ABNORMAL LOW (ref 6–24)
Bilirubin Total: 0.6 mg/dL (ref 0.0–1.2)
CO2: 23 mmol/L (ref 20–29)
Calcium: 9.2 mg/dL (ref 8.7–10.2)
Chloride: 105 mmol/L (ref 96–106)
Creatinine, Ser: 0.67 mg/dL (ref 0.57–1.00)
GFR calc Af Amer: 112 mL/min/1.73
GFR calc non Af Amer: 97 mL/min/1.73
Globulin, Total: 2.1 g/dL (ref 1.5–4.5)
Glucose: 136 mg/dL — ABNORMAL HIGH (ref 65–99)
Potassium: 3.2 mmol/L — ABNORMAL LOW (ref 3.5–5.2)
Sodium: 143 mmol/L (ref 134–144)
Total Protein: 6 g/dL (ref 6.0–8.5)

## 2018-10-18 LAB — CBC WITH DIFFERENTIAL/PLATELET
Basophils Absolute: 0 10*3/uL (ref 0.0–0.2)
Basos: 1 %
EOS (ABSOLUTE): 0.1 10*3/uL (ref 0.0–0.4)
Eos: 1 %
Hematocrit: 33.5 % — ABNORMAL LOW (ref 34.0–46.6)
Hemoglobin: 11.2 g/dL (ref 11.1–15.9)
Immature Grans (Abs): 0 10*3/uL (ref 0.0–0.1)
Immature Granulocytes: 0 %
Lymphocytes Absolute: 2.1 10*3/uL (ref 0.7–3.1)
Lymphs: 32 %
MCH: 29 pg (ref 26.6–33.0)
MCHC: 33.4 g/dL (ref 31.5–35.7)
MCV: 87 fL (ref 79–97)
Monocytes Absolute: 0.5 10*3/uL (ref 0.1–0.9)
Monocytes: 7 %
Neutrophils Absolute: 3.8 10*3/uL (ref 1.4–7.0)
Neutrophils: 59 %
Platelets: 236 10*3/uL (ref 150–450)
RBC: 3.86 x10E6/uL (ref 3.77–5.28)
RDW: 15.2 % (ref 11.7–15.4)
WBC: 6.5 10*3/uL (ref 3.4–10.8)

## 2018-10-18 LAB — BRAIN NATRIURETIC PEPTIDE: BNP: 44.7 pg/mL (ref 0.0–100.0)

## 2018-10-18 LAB — TSH: TSH: 1.48 u[IU]/mL (ref 0.450–4.500)

## 2018-10-18 MED ORDER — POTASSIUM CHLORIDE CRYS ER 20 MEQ PO TBCR: 20.0000 meq | EXTENDED_RELEASE_TABLET | Freq: Every day | ORAL | 3 refills | Status: DC

## 2018-10-18 MED ORDER — METFORMIN HCL 500 MG PO TABS: 500.0000 mg | ORAL_TABLET | Freq: Every day | ORAL | 3 refills | Status: DC

## 2018-10-18 MED ORDER — FUROSEMIDE 20 MG PO TABS: 20.0000 mg | ORAL_TABLET | Freq: Every day | ORAL | 3 refills | Status: DC

## 2018-10-22 ENCOUNTER — Encounter: Payer: Self-pay | Admitting: Cardiology

## 2018-10-22 NOTE — Progress Notes (Signed)
Virtual Visit via Telephone Note   This visit type was conducted due to national recommendations for restrictions regarding the COVID-19 Pandemic (e.g. social distancing) in an effort to limit this patient's exposure and mitigate transmission in our community.  Due to her co-morbid illnesses, this patient is at least at moderate risk for complications without adequate follow up.  This format is felt to be most appropriate for this patient at this time.  The patient did not have access to video technology/had technical difficulties with video requiring transitioning to audio format only (telephone).  All issues noted in this document were discussed and addressed.  No physical exam could be performed with this format.  Please refer to the patient's chart for her  consent to telehealth for [facility].   Evaluation Performed:  Follow-up visit  Date:  10/23/2018   ID:  [name], DOB [birth year], MRN [id number]  Patient Location: Home Provider Location: Home  PCP:  Baruch Gouty, FNP  Cardiologist:   Minus Breeding, MD [facility] Electrophysiologist:  None   Chief Complaint:  Chest pain and edema  History of Present Illness:    [name] is a 58 y.o. female with a vague heart history of palpitations years ago.  She presents for evaluation of lower extremity swelling and chest pain.  She was in the ED in Feb with chest pain.  I reviewed these records for this visit.   There was no objective evidence of ischemia.     She says that she has been getting chest discomfort off and on for many months.  This does not seem to be an accelerated pattern.  It seems to happen sporadically.  It typically happens with activity.  She does not describe associated nausea vomiting or diaphoresis.  It seems to be a sharp substernal discomfort.  It is moderate in intensity.  It does not always come on with activities.  She works as a [employer]ar[employer] manager and she can do activities without bringing this on.  She does not  have PND or orthopnea but she reports increased shortness of breath with activities.  She said she had some lower extremity swelling.  Last week she was started on diuretics and was seen by her primary provider.  I reviewed these records.  She had a BNP that was normal.  She is diabetic which is a new diagnosis for her but otherwise had no significant abnormalities.  EKG was done which demonstrated no acute ST segment changes although there was poor anterior R wave progression.  She is not had any history of coronary artery disease and has not had any evaluation for this in the past.  Her biggest complaint has been fatigue.  The patient does not have symptoms concerning for COVID-19 infection (fever, chills, cough, or new shortness of breath).  While she does have shortness of breath this seems to be chronic.  She said she had fever but this was many days ago.  I will send a message to Baruch Gouty, FNP as she is going to be seen in the clinic today and could have her pulse ox temperature and possibly a chest x-ray.  Past Medical History:  Diagnosis Date  . DDD (degenerative disc disease)    spinal injections  . Diverticular disease   . Horseshoe kidney   . Hypercholesterolemia   . Irritable bowel syndrome    Past Surgical History:  Procedure Laterality Date  . cervical dysplasia    . COLONOSCOPY  07/01/2009  [id number] seen in the ascending and sigmoid colon/small internal hemorrhoids/6-mm sessile polyp/4-mm sessile polyp.3-mm sessile polyp removed. simple adenomas  . COLONOSCOPY N/A 03/25/2013   Procedure: COLONOSCOPY;  Surgeon: Danie Binder, MD;  [facility];  Service: Endoscopy;  Laterality: N/A;  10:15  . ESOPHAGEAL DILATION N/A 02/04/2015   Procedure: ESOPHAGEAL DILATION;  Surgeon: Danie Binder, MD;  [facility];  Service: Endoscopy;  Laterality: N/A;  . ESOPHAGOGASTRODUODENOSCOPY  07/01/2009   [id number]:[id number] esophagus/dilation to 16 mm possible proximal  cervical web  . ESOPHAGOGASTRODUODENOSCOPY N/A 02/04/2015   Procedure: ESOPHAGOGASTRODUODENOSCOPY (EGD);  Surgeon: Danie Binder, MD;  [facility];  Service: Endoscopy;  Laterality: N/A;  0800-moved to 845 Office to notify pt  . ESOPHAGOGASTRODUODENOSCOPY (EGD) WITH ESOPHAGEAL DILATION N/A 03/25/2013   Procedure: ESOPHAGOGASTRODUODENOSCOPY (EGD) WITH ESOPHAGEAL DILATION;  Surgeon: Danie Binder, MD;  [facility];  Service: Endoscopy;  Laterality: N/A;  . TONSILLECTOMY    . TUBAL LIGATION    . WRIST SURGERY       Prior to Admission medications   Medication Sig Start Date End Date Taking? Authorizing Provider  furosemide (LASIX) 20 MG tablet Take 1 tablet (20 mg total) by mouth daily. 10/18/18  Yes Rakes, Connye Burkitt, FNP  metFORMIN (GLUCOPHAGE) 500 MG tablet Take 1 tablet (500 mg total) by mouth daily with breakfast. 10/18/18  Yes Rakes, Connye Burkitt, FNP  oxyCODONE (OXY IR/ROXICODONE) 5 MG immediate release tablet Take 5 mg by mouth 3 (three) times daily.    Yes [provider]  potassium chloride SA (K-DUR,KLOR-CON) 20 MEQ tablet Take 1 tablet (20 mEq total) by mouth daily. 10/18/18  Yes Rakes, Connye Burkitt, FNP  DULoxetine (CYMBALTA) 30 MG capsule Take 1 capsule (30 mg total) by mouth daily for 30 days. Patient not taking: Reported on 10/17/2018 09/26/18 10/26/18  Baruch Gouty, FNP  simethicone (MYLICON) 229 MG chewable tablet Chew 125 mg by mouth every 6 (six) hours as needed for flatulence.    [provider]     Allergies:   Penicillins and Codeine   Social History   Tobacco Use  . Smoking status: Former Smoker    Packs/day: 1.00    Years: 25.00    Pack years: 25.00    Last attempt to quit: 07/10/1997    Years since quitting: 21.3  . Smokeless tobacco: Never Used  Substance Use Topics  . Alcohol use: No  . Drug use: No     Family Hx: The patient's family history includes COPD in her brother and mother; Cancer in her mother; Colon cancer in her brother  and father; Diabetes in her sister; Heart disease in her mother; Hyperlipidemia in her mother; Hypertension in her father and mother; Irritable bowel syndrome in her mother and sister; Thyroid disease in her mother.  ROS:   Please see the history of present illness.    As stated in the HPI and negative for all other systems.   Prior CV studies:   The following studies were reviewed today: EKG  Labs/Other Tests and Data Reviewed:    EKG:   10/17/18  Sinus rhythm, rate 88, axis within normal limits, intervals within normal limits, no acute ST-T wave changes.  Poor anterior R wave progression.  No acute ST T wave changes.   Recent Labs: 10/17/2018: ALT 30; BNP 44.7; BUN 3; Creatinine, Ser 0.67; Hemoglobin 11.2; Platelets 236; Potassium 3.2; Sodium 143; TSH 1.480   Recent Lipid Panel No results found  for: CHOL, TRIG, HDL, CHOLHDL, LDLCALC, LDLDIRECT  Wt Readings from Last 3 Encounters:  10/23/18 175 lb (79.4 kg)  10/17/18 176 lb (79.8 kg)  09/26/18 172 lb (78 kg)     Objective:    Vital Signs:  Temp 98.9 F (37.2 C)   Ht 5\' 2"  (1.575 m)   Wt 175 lb (79.4 kg)   BMI 32.01 kg/m     ASSESSMENT & PLAN:    CHEST PAIN: Her chest pain has atypical greater than typical features.  There is a concern for new onset heart failure although her BNP level was normal.  I am going to order an echocardiogram.  We will get that this week and then further testing will be based on this result.  For now she is going to continue the meds as listed.  I will consider CT scanning versus cardiac catheterization based on the results of the echo.  She is going to start taking aspirin daily.  If her EF is normal will start beta-blocker.  DYSLIPIDEMIA:  I do not see recent results.  She can have this repeated in the future.  Given new diagnosis of dyslipidemia she should be on statin and I will discuss this with her at the next call     DM:  New diagnosis started on meds as above.  EDEMA:  Echo as above.    COVID-19 Education: The signs and symptoms of COVID-19 were discussed with the patient and how to seek care for testing (follow up with PCP or arrange E-visit).  The importance of social distancing was discussed today.  Time:   Today, I have spent 25 minutes with the patient with telehealth technology discussing the above problems.     Medication Adjustments/Labs and Tests Ordered: Current medicines are reviewed at length with the patient today.  Concerns regarding medicines are outlined above.   Tests Ordered: No orders of the defined types were placed in this encounter.   Medication Changes: No orders of the defined types were placed in this encounter.   Disposition:  Follow up one week  Signed, Minus Breeding, MD  10/23/2018 11:48 AM    [place]

## 2018-10-23 ENCOUNTER — Telehealth (INDEPENDENT_AMBULATORY_CARE_PROVIDER_SITE_OTHER): Payer: PRIVATE HEALTH INSURANCE | Admitting: Cardiology

## 2018-10-23 ENCOUNTER — Encounter: Payer: Self-pay | Admitting: Cardiology

## 2018-10-23 ENCOUNTER — Ambulatory Visit (INDEPENDENT_AMBULATORY_CARE_PROVIDER_SITE_OTHER): Payer: PRIVATE HEALTH INSURANCE

## 2018-10-23 ENCOUNTER — Encounter: Payer: Self-pay | Admitting: Family Medicine

## 2018-10-23 ENCOUNTER — Other Ambulatory Visit: Payer: Self-pay

## 2018-10-23 ENCOUNTER — Ambulatory Visit: Payer: PRIVATE HEALTH INSURANCE | Admitting: Family Medicine

## 2018-10-23 VITALS — Temp 98.9°F | Ht 62.0 in | Wt 175.0 lb

## 2018-10-23 VITALS — BP 114/73 | HR 98 | Temp 97.4°F | Ht 62.0 in | Wt 170.2 lb

## 2018-10-23 DIAGNOSIS — R0609 Other forms of dyspnea: Secondary | ICD-10-CM | POA: Diagnosis not present

## 2018-10-23 DIAGNOSIS — M7989 Other specified soft tissue disorders: Secondary | ICD-10-CM | POA: Insufficient documentation

## 2018-10-23 DIAGNOSIS — R072 Precordial pain: Secondary | ICD-10-CM | POA: Diagnosis not present

## 2018-10-23 DIAGNOSIS — M542 Cervicalgia: Secondary | ICD-10-CM

## 2018-10-23 DIAGNOSIS — E1165 Type 2 diabetes mellitus with hyperglycemia: Secondary | ICD-10-CM | POA: Diagnosis not present

## 2018-10-23 DIAGNOSIS — F339 Major depressive disorder, recurrent, unspecified: Secondary | ICD-10-CM

## 2018-10-23 DIAGNOSIS — Z7189 Other specified counseling: Secondary | ICD-10-CM

## 2018-10-23 DIAGNOSIS — M549 Dorsalgia, unspecified: Secondary | ICD-10-CM

## 2018-10-23 DIAGNOSIS — G8929 Other chronic pain: Secondary | ICD-10-CM

## 2018-10-23 DIAGNOSIS — R9431 Abnormal electrocardiogram [ECG] [EKG]: Secondary | ICD-10-CM

## 2018-10-23 LAB — GLUCOSE HEMOCUE WAIVED: Glu Hemocue Waived: 147 mg/dL — ABNORMAL HIGH (ref 65–99)

## 2018-10-23 MED ORDER — DULOXETINE HCL 30 MG PO CPEP: 30.0000 mg | ORAL_CAPSULE | Freq: Every day | ORAL | 3 refills | Status: DC

## 2018-10-23 MED ORDER — ASPIRIN EC 81 MG PO TBEC: 81.0000 mg | DELAYED_RELEASE_TABLET | Freq: Every day | ORAL | Status: DC

## 2018-10-23 MED ORDER — BLOOD GLUCOSE METER KIT: PACK | 0 refills | Status: DC

## 2018-10-23 NOTE — Patient Instructions (Addendum)
Medication Instructions:  Please start an aspirin 81 mg a day. Continue all other medications as listed.  If you need a refill on your cardiac medications before your next appointment, please call your pharmacy.   Testing/Procedures: Your physician has requested that you have an echocardiogram. Echocardiography is a painless test that uses sound waves to create images of your heart. It provides your doctor with information about the size and shape of your heart and how well your heart's chambers and valves are working. This procedure takes approximately one hour. There are no restrictions for this procedure.  This will be completed at [facility] in [place].  Follow-Up: Follow up with Dr Percival Spanish 11/06/2018 at 10 am by phone visit.  Please expect a phone call before your appointment to review heart rate, blood pressure, ht, wt and medications.  Thank you for choosing [facility]!!

## 2018-10-23 NOTE — Progress Notes (Signed)
Subjective:  Patient ID: [name], female    DOB: December 10, 1960, 58 y.o.   MRN: [id number]  Chief Complaint:  three day recheck edema   HPI: [name] is a 58 y.o. female presenting on 10/23/2018 for three day recheck edema   1. Type 2 diabetes mellitus with hyperglycemia, without long-term current use of insulin (HCC)  Diabetes mellitus 2 Compliant with meds - Yes Checking CBGs? No Exercising regularly? - No Watching carbohydrate intake? - No Neuropathy ? - No Hypoglycemic events - No  Pertinent ROS:  Polyuria - No Polydipsia - No Vision problems - No   2. Dyspnea on exertion  Lasix initiated last week. Pt has lost 6 lbs since initiation. Pt states the edema in her lower extremities has improved and her dyspnea with exertion has improved. Pt states she still has to prop on two pillows to sleep. States she does feel slightly better since her last visit. No chest pain, palpitations, dizziness, or syncope. Pt states she still has fatigue.    No fever, recent travel, or known exposure to anyone sick. No cough, headaches, diarrhea, sore throat, or myalgias. Pt does work in an assisted living facility, but facility has not allowed any visitors for the past 2 months. No residents sick at this time or recently.   Relevant past medical, surgical, family, and social history reviewed and updated as indicated.  Allergies and medications reviewed and updated.   Past Medical History:  Diagnosis Date  . DDD (degenerative disc disease)    spinal injections  . Diverticular disease   . Horseshoe kidney   . Hypercholesterolemia   . Irritable bowel syndrome     Past Surgical History:  Procedure Laterality Date  . cervical dysplasia    . COLONOSCOPY  07/01/2009   URK:[id number] seen in the ascending and sigmoid colon/small internal hemorrhoids/6-mm sessile polyp/4-mm sessile polyp.3-mm sessile polyp removed. simple adenomas  . COLONOSCOPY N/A 03/25/2013   Procedure:  COLONOSCOPY;  Surgeon: Danie Binder, MD;  [facility];  Service: Endoscopy;  Laterality: N/A;  10:15  . ESOPHAGEAL DILATION N/A 02/04/2015   Procedure: ESOPHAGEAL DILATION;  Surgeon: Danie Binder, MD;  [facility];  Service: Endoscopy;  Laterality: N/A;  . ESOPHAGOGASTRODUODENOSCOPY  07/01/2009   TDV:VOHYWV esophagus/dilation to 16 mm possible proximal cervical web  . ESOPHAGOGASTRODUODENOSCOPY N/A 02/04/2015   Procedure: ESOPHAGOGASTRODUODENOSCOPY (EGD);  Surgeon: Danie Binder, MD;  [facility];  Service: Endoscopy;  Laterality: N/A;  0800-moved to 845 Office to notify pt  . ESOPHAGOGASTRODUODENOSCOPY (EGD) WITH ESOPHAGEAL DILATION N/A 03/25/2013   Procedure: ESOPHAGOGASTRODUODENOSCOPY (EGD) WITH ESOPHAGEAL DILATION;  Surgeon: Danie Binder, MD;  [facility];  Service: Endoscopy;  Laterality: N/A;  . TONSILLECTOMY    . TUBAL LIGATION    . WRIST SURGERY      Social History   Socioeconomic History  . Marital status: Legally Separated    Spouse name: Not on file  . Number of children: Not on file  . Years of education: Not on file  . Highest education level: Not on file  Occupational History  . 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 Financial resource strain: Not on file  . Food insecurity:    Worry: Not on file    Inability: Not on file  . Transportation needs:    Medical: Not on file  Non-medical: Not on file  Tobacco Use  . Smoking status: Former Smoker    Packs/day: 1.00    Years: 25.00    Pack years: 25.00    Last attempt to quit: 07/10/1997    Years since quitting: 21.3  . Smokeless tobacco: Never Used  Substance and Sexual Activity  . Alcohol use: No  . Drug use: No  . Sexual activity: Not Currently  Lifestyle  . Physical activity:    Days per week: Not on file    Minutes per session: Not on file  . Stress: Not on file  Relationships  . Social  connections:    Talks on phone: Not on file    Gets together: Not on file    Attends religious service: Not on file    Active member of club or organization: Not on file    Attends meetings of clubs or organizations: Not on file    Relationship status: Not on file  . Intimate partner violence:    Fear of current or ex partner: Not on file    Emotionally abused: Not on file    Physically abused: Not on file    Forced sexual activity: Not on file  Other Topics Concern  . Not on file  Social History Narrative   Lives alone.  CNA at [employer].      Outpatient Encounter Medications as of 10/23/2018  Medication Sig  . aspirin EC 81 MG tablet Take 1 tablet (81 mg total) by mouth daily.  . furosemide (LASIX) 20 MG tablet Take 1 tablet (20 mg total) by mouth daily.  . metFORMIN (GLUCOPHAGE) 500 MG tablet Take 1 tablet (500 mg total) by mouth daily with breakfast.  . potassium chloride SA (K-DUR,KLOR-CON) 20 MEQ tablet Take 1 tablet (20 mEq total) by mouth daily.  . simethicone (MYLICON) 081 MG chewable tablet Chew 125 mg by mouth every 6 (six) hours as needed for flatulence.  . DULoxetine (CYMBALTA) 30 MG capsule Take 1 capsule (30 mg total) by mouth daily for 30 days. (Patient not taking: Reported on 10/17/2018)  . oxyCODONE (OXY IR/ROXICODONE) 5 MG immediate release tablet Take 5 mg by mouth 3 (three) times daily.    No facility-administered encounter medications on file as of 10/23/2018.     Allergies  Allergen Reactions  . Penicillins Anaphylaxis and Itching    Has patient had a PCN reaction causing immediate rash, facial/tongue/throat swelling, SOB or lightheadedness with hypotension: Yes Has patient had a PCN reaction causing severe rash involving mucus membranes or skin necrosis: No Has patient had a PCN reaction that required hospitalization Yes Has patient had a PCN reaction occurring within the last 10 years: Yes If all of the above answers are "NO", then may proceed with  Cephalosporin use.   . Codeine Nausea And Vomiting    Review of Systems  Constitutional: Positive for fatigue. Negative for activity change, appetite change, chills, fever and unexpected weight change.  Eyes: Negative for photophobia and visual disturbance.  Respiratory: Positive for shortness of breath (exertional). Negative for cough, chest tightness and wheezing.   Cardiovascular: Positive for leg swelling. Negative for chest pain and palpitations.  Gastrointestinal: Negative for abdominal distention and abdominal pain.  Endocrine: Negative for polydipsia, polyphagia and polyuria.  Genitourinary: Negative for decreased urine volume and difficulty urinating.  Musculoskeletal: Negative for arthralgias and myalgias.  Neurological: Negative for dizziness, tremors, seizures, syncope, facial asymmetry, speech difficulty, weakness, light-headedness, numbness and headaches.  Psychiatric/Behavioral: Negative for confusion.  All other systems reviewed and are negative.       Objective:  BP 114/73   Pulse 98   Temp (!) 97.4 F (36.3 C) (Oral)   Ht _0  (1.575 m)   Wt 170 lb 3.2 oz (77.2 kg)   SpO2 97%   BMI 31.13 kg/m    Wt Readings from Last 3 Encounters:  10/23/18 170 lb 3.2 oz (77.2 kg)  10/23/18 175 lb (79.4 kg)  10/17/18 176 lb (79.8 kg)    Physical Exam Vitals signs and nursing note reviewed.  Constitutional:      General: She is not in acute distress.    Appearance: Normal appearance. She is well-developed and well-groomed. She is not ill-appearing or toxic-appearing.  HENT:     Head: Normocephalic and atraumatic.     Mouth/Throat:     Mouth: Mucous membranes are moist.     Pharynx: Oropharynx is clear.  Eyes:     Conjunctiva/sclera: Conjunctivae normal.     Pupils: Pupils are equal, round, and reactive to light.  Cardiovascular:     Rate and Rhythm: Normal rate and regular rhythm.     Pulses: Normal pulses.     Heart sounds: Normal heart sounds. No murmur. No  friction rub. No gallop.   Pulmonary:     Effort: Pulmonary effort is normal. No respiratory distress.     Breath sounds: Normal breath sounds. No wheezing, rhonchi or rales.  Abdominal:     General: Bowel sounds are normal. There is no distension.     Palpations: Abdomen is soft.     Tenderness: There is no abdominal tenderness.  Musculoskeletal:     Right lower leg: 1+ Pitting Edema present.     Left lower leg: 1+ Pitting Edema present.  Skin:    General: Skin is warm and dry.     Capillary Refill: Capillary refill takes less than 2 seconds.     Coloration: Skin is not pale.     Findings: No bruising.  Neurological:     General: No focal deficit present.     Mental Status: She is alert and oriented to person, place, and time.  Psychiatric:        Mood and Affect: Mood normal.        Behavior: Behavior normal. Behavior is cooperative.        Thought Content: Thought content normal.        Judgment: Judgment normal.     Results for orders placed or performed in visit on 10/17/18  Brain natriuretic peptide  Result Value Ref Range   BNP 44.7 0.0 - 100.0 pg/mL  CBC with Differential/Platelet  Result Value Ref Range   WBC 6.5 3.4 - 10.8 x10E3/uL   RBC 3.86 3.77 - 5.28 x10E6/uL   Hemoglobin 11.2 11.1 - 15.9 g/dL   Hematocrit 33.5 (L) 34.0 - 46.6 %   MCV 87 79 - 97 fL   MCH 29.0 26.6 - 33.0 pg   MCHC 33.4 31.5 - 35.7 g/dL   RDW 15.2 11.7 - 15.4 %   Platelets 236 150 - 450 x10E3/uL   Neutrophils 59 Not Estab. %   Lymphs 32 Not Estab. %   Monocytes 7 Not Estab. %   Eos 1 Not Estab. %   Basos 1 Not Estab. %   Neutrophils Absolute 3.8 1.4 - 7.0 x10E3/uL   Lymphocytes Absolute 2.1 0.7 - 3.1 x10E3/uL   Monocytes Absolute 0.5 0.1 - 0.9 x10E3/uL   EOS (ABSOLUTE) 0.1  0.0 - 0.4 x10E3/uL   Basophils Absolute 0.0 0.0 - 0.2 x10E3/uL   Immature Granulocytes 0 Not Estab. %   Immature Grans (Abs) 0.0 0.0 - 0.1 x10E3/uL  CMP14+EGFR  Result Value Ref Range   Glucose 136 (H) 65 - 99  mg/dL   BUN 3 (L) 6 - 24 mg/dL   Creatinine, Ser 0.67 0.57 - 1.00 mg/dL   GFR calc non Af Amer 97 >59 mL/min/1.73   GFR calc Af Amer 112 >59 mL/min/1.73   BUN/Creatinine Ratio 4 (L) 9 - 23   Sodium 143 134 - 144 mmol/L   Potassium 3.2 (L) 3.5 - 5.2 mmol/L   Chloride 105 96 - 106 mmol/L   CO2 23 20 - 29 mmol/L   Calcium 9.2 8.7 - 10.2 mg/dL   Total Protein 6.0 6.0 - 8.5 g/dL   Albumin 3.9 3.8 - 4.9 g/dL   Globulin, Total 2.1 1.5 - 4.5 g/dL   Albumin/Globulin Ratio 1.9 1.2 - 2.2   Bilirubin Total 0.6 0.0 - 1.2 mg/dL   Alkaline Phosphatase 92 39 - 117 IU/L   AST 33 0 - 40 IU/L   ALT 30 0 - 32 IU/L  TSH  Result Value Ref Range   TSH 1.480 0.450 - 4.500 uIU/mL  Bayer DCA Hb A1c Waived  Result Value Ref Range   HB A1C (BAYER DCA - WAIVED) 7.9 (H) <7.0 %       Pertinent labs & imaging results that were available during my care of the patient were reviewed by me and considered in my medical decision making.  Assessment & Plan:  [place] was seen today for three day recheck edema.  Diagnoses and all orders for this visit:  Type 2 diabetes mellitus with hyperglycemia, without long-term current use of insulin (HCC) FSBS 147 today. Diet and exercise discussed and encouraged. Will recheck A1C in 3 months. Will order blood sugar monitoring device today. Pt aware to report any persistent highs or lows. Continue medications as prescribed.  -     Glucose Hemocue Waived  Dyspnea on exertion Has improved slightly with Lasix, pt 6 lbs down today from last weeks visit. Pt has follow up scheduled with cardiology for echo. CXR pending. Pulse ox 96% on room air today.  -     DG Chest 2 View; Future -     Pulse oximetry     Continue all other maintenance medications.  Follow up plan: Return in about 3 months (around 01/22/2019), or if symptoms worsen or fail to improve, for DM.    The above assessment and management plan was discussed with the patient. The patient verbalized understanding  of and has agreed to the management plan. Patient is aware to call the clinic if symptoms persist or worsen. Patient is aware when to return to the clinic for a follow-up visit. Patient educated on when it is appropriate to go to the emergency department.   Monia Pouch, FNP-C [facility] [phone]

## 2018-10-25 ENCOUNTER — Ambulatory Visit: Payer: PRIVATE HEALTH INSURANCE | Attending: Family Medicine | Admitting: Physical Therapy

## 2018-10-25 ENCOUNTER — Ambulatory Visit (HOSPITAL_COMMUNITY)
Admission: RE | Admit: 2018-10-25 | Discharge: 2018-10-25 | Disposition: A | Discharge status: Home / Self Care | Payer: PRIVATE HEALTH INSURANCE | Source: Ambulatory Visit | Attending: Cardiology | Admitting: Cardiology

## 2018-10-25 ENCOUNTER — Encounter: Payer: Self-pay | Admitting: Physical Therapy

## 2018-10-25 ENCOUNTER — Other Ambulatory Visit: Payer: Self-pay

## 2018-10-25 DIAGNOSIS — R9431 Abnormal electrocardiogram [ECG] [EKG]: Secondary | ICD-10-CM | POA: Insufficient documentation

## 2018-10-25 DIAGNOSIS — R293 Abnormal posture: Secondary | ICD-10-CM

## 2018-10-25 DIAGNOSIS — R0609 Other forms of dyspnea: Secondary | ICD-10-CM | POA: Diagnosis not present

## 2018-10-25 DIAGNOSIS — M5412 Radiculopathy, cervical region: Secondary | ICD-10-CM | POA: Insufficient documentation

## 2018-10-25 DIAGNOSIS — M542 Cervicalgia: Secondary | ICD-10-CM | POA: Insufficient documentation

## 2018-10-25 NOTE — Progress Notes (Signed)
*  PRELIMINARY RESULTS* Echocardiogram 2D Echocardiogram has been performed.  [name] 10/25/2018, 2:01 PM

## 2018-10-25 NOTE — Therapy (Signed)
[facility] [place], [place], [address] Phone: [phone]   Fax:  [phone]  Physical Therapy Evaluation  Patient Details  Name: [name] MRN: [id number] Date of Birth: 06-06-1961 Referring Provider (PT): Darla Lesches, FNP   Encounter Date: 10/25/2018  PT End of Session - 10/25/18 1054    Visit Number  1    Number of Visits  12    Date for PT Re-Evaluation  12/20/18    PT Start Time  0945    PT Stop Time  1030    PT Time Calculation (min)  45 min    Activity Tolerance  Patient tolerated treatment well    Behavior During Therapy  [facility] for tasks assessed/performed       Past Medical History:  Diagnosis Date  . DDD (degenerative disc disease)    spinal injections  . Diverticular disease   . Horseshoe kidney   . Hypercholesterolemia   . Irritable bowel syndrome     Past Surgical History:  Procedure Laterality Date  . cervical dysplasia    . COLONOSCOPY  07/01/2009   QBH:ALPFXTKWIOX seen in the ascending and sigmoid colon/small internal hemorrhoids/6-mm sessile polyp/4-mm sessile polyp.3-mm sessile polyp removed. simple adenomas  . COLONOSCOPY N/A 03/25/2013   Procedure: COLONOSCOPY;  Surgeon: Danie Binder, MD;  [facility];  Service: Endoscopy;  Laterality: N/A;  10:15  . ESOPHAGEAL DILATION N/A 02/04/2015   Procedure: ESOPHAGEAL DILATION;  Surgeon: Danie Binder, MD;  [facility];  Service: Endoscopy;  Laterality: N/A;  . ESOPHAGOGASTRODUODENOSCOPY  07/01/2009   BDZ:HGDJME esophagus/dilation to 16 mm possible proximal cervical web  . ESOPHAGOGASTRODUODENOSCOPY N/A 02/04/2015   Procedure: ESOPHAGOGASTRODUODENOSCOPY (EGD);  Surgeon: Danie Binder, MD;  [facility];  Service: Endoscopy;  Laterality: N/A;  0800-moved to 845 Office to notify pt  . ESOPHAGOGASTRODUODENOSCOPY (EGD) WITH ESOPHAGEAL DILATION N/A 03/25/2013   Procedure: ESOPHAGOGASTRODUODENOSCOPY (EGD) WITH  ESOPHAGEAL DILATION;  Surgeon: Danie Binder, MD;  [facility];  Service: Endoscopy;  Laterality: N/A;  . TONSILLECTOMY    . TUBAL LIGATION    . WRIST SURGERY      There were no vitals filed for this visit.   Subjective Assessment - 10/25/18 1104    Subjective  COVID-19 screening performed prior to patient entering the building. Patient arrives to physical therapy with reports of ongoing cervical pain, headaches, radiating symptoms down left arm to 3rd to 5th fingers. Patient reported she has had cervical pain and headaches for several years but the neurological symptoms began in January and believes it was aggravated after taking Flexeril and Prednisone. Patient reports all ADLs and work activities are limited due to pain. Patient reports pain at worst is 10/10 and pain at best is "mild/moderate" with prescription pain medication and rest. Patient's goals are to decrease pain, improve movement, improve strength, improve ability to perform home and work activities and decrease headaches.    Pertinent History  DM, heart arrhythmia,     Limitations  House hold activities;Lifting    Diagnostic tests  MRI and X-ray "several years ago"    Patient Stated Goals  decrease pain and get rid of headaches    Currently in Pain?  Yes    Pain Score  6     Pain Location  Neck    Pain Orientation  Left    Pain Descriptors / Indicators  Sharp;Tingling;Numbness    Pain Type  Chronic pain  Pain Radiating Towards  numbness and tingling to 3rd, 4th and 5th fingers    Pain Onset  More than a month ago    Pain Frequency  Constant    Aggravating Factors   "stress, a lot of movement"    Pain Relieving Factors  laying down, pain medication, heat pack    Effect of Pain on Daily Activities  "I don't want to do anything but lay down"         [facility] PT Assessment - 10/25/18 0001      Assessment   Medical Diagnosis  Cervicalgia, right arm pain, DDD cervical    Referring Provider (PT)  Darla Lesches,  FNP    Onset Date/Surgical Date  --   ongoing   Hand Dominance  Right    Next MD Visit  "3 weeks"    Prior Therapy  no      Precautions   Precautions  None      Balance Screen   Has the patient fallen in the past 6 months  No    Has the patient had a decrease in activity level because of a fear of falling?   No    Is the patient reluctant to leave their home because of a fear of falling?   No      Home Environment   Living Environment  Private residence      Prior Function   Level of Independence  Independent      Posture/Postural Control   Posture/Postural Control  Postural limitations    Postural Limitations  Rounded Shoulders;Forward head;Increased thoracic kyphosis      Deep Tendon Reflexes   DTR Assessment Site  Biceps;Triceps    Biceps DTR  2+    Triceps DTR  1+      ROM / Strength   AROM / PROM / Strength  AROM;Strength      AROM   AROM Assessment Site  Cervical    Cervical Flexion  40    Cervical Extension  23    Cervical - Right Side Bend  25    Cervical - Left Side Bend  23    Cervical - Right Rotation  52    Cervical - Left Rotation  53      Strength   Strength Assessment Site  Shoulder    Right/Left Shoulder  Right;Left    Right Shoulder Flexion  4-/5    Right Shoulder ABduction  4-/5    Right Shoulder Internal Rotation  4/5    Right Shoulder External Rotation  4/5    Left Shoulder Flexion  3+/5    Left Shoulder ABduction  3+/5    Left Shoulder Internal Rotation  4/5    Left Shoulder External Rotation  4/5      Palpation   Palpation comment  increased left UT muscle tone, increased tenderness to palpation to left lumbar paraspinals and left insertion point of the levator scap      Special Tests    Special Tests  --    Cervical Tests  --                Objective measurements completed on examination: See above findings.              PT Education - 10/25/18 1139    Education Details  chin tucks, scapular retractions,  levator scap stretch, UT stretch, corner stretch    Person(s) Educated  Patient    Methods  Explanation;Demonstration;Handout  Comprehension  Verbalized understanding;Returned demonstration          PT Long Term Goals - 10/25/18 1144      PT LONG TERM GOAL #1   Title  Patient will be independent with HEP    Time  6    Period  Weeks    Status  New      PT LONG TERM GOAL #2   Title  Patient will report ability to perform home and work activities with neck pain less than or equal to a 4/10    Time  6    Period  Weeks    Status  New      PT LONG TERM GOAL #3   Title  Patient will report a centralization of neurological symptoms to left elbow or above to indicate decreased nerve irritation.     Time  6    Period  Weeks    Status  New      PT LONG TERM GOAL #4   Title  Patient will demonstrate 60+ degrees or cervical AROM to improve ability to scan enviroment and look over shoulder while driving.    Time  6    Period  Weeks    Status  New             Plan - 10/25/18 1140    Clinical Impression Statement  Patient is a 58 year old female who presents to physical therapy with cervical pain, decreased cervical AROM, decreased UE strength, and abnormal posture. Patient noted with increased muscle tone in UTs and noted with tenderness to palpation to cervical paraspinals, suboccipitals, and insertion point of the left levator scapula muscle. Patient noted with normal biceps reflex but diminished triceps reflex. Patient provided with HEP and reviewed proper technique and form. Patient reported understanding. Patient would benefit from skilled physical therapy to address deficits and address patient's goals.     Personal Factors and Comorbidities  Comorbidity 2    Comorbidities  DM, heart arrhythmia    Stability/Clinical Decision Making  Stable/Uncomplicated    Clinical Decision Making  Low    Rehab Potential  Good    PT Frequency  2x / week    PT Duration  6 weeks    PT  Treatment/Interventions  ADLs/Self Care Home Management;Electrical Stimulation;Moist Heat;Traction;Ultrasound;Cryotherapy;Therapeutic activities;Therapeutic exercise;Dry needling;Manual techniques;Passive range of motion;Taping;Patient/family education    PT Next Visit Plan  Chin tucks, postural exercises, passive cervical ROM, suboccipital release, manual traction, progress to mechanical per tolerance, modalities for pain relief    PT Home Exercise Plan  see patient education section    Consulted and Agree with Plan of Care  Patient       Patient will benefit from skilled therapeutic intervention in order to improve the following deficits and impairments:  Pain, Postural dysfunction, Decreased activity tolerance, Decreased range of motion, Decreased strength  Visit Diagnosis: Cervicalgia - Plan: PT plan of care cert/re-cert  Radiculopathy, cervical region - Plan: PT plan of care cert/re-cert  Abnormal posture - Plan: PT plan of care cert/re-cert     Problem List Patient Active Problem List   Diagnosis Date Noted  . Precordial chest pain 10/23/2018  . Leg swelling 10/23/2018  . Nonspecific abnormal electrocardiogram (ECG) (EKG) 10/23/2018  . Dyspnea on exertion 10/23/2018  . Educated About Covid-19 Virus Infection 10/23/2018  . Type 2 diabetes mellitus with hyperglycemia, without long-term current use of insulin ([place]) 10/17/2018  . DDD (degenerative disc disease), lumbar 09/26/2018  . DDD (degenerative  disc disease), cervical 09/26/2018  . Paresthesia of arm 09/26/2018  . Chronic neck and back pain 09/26/2018  . Depression, recurrent ([place]) 09/26/2018  . Cervicalgia 09/26/2018  . Rectal bleeding 08/29/2018  . Hyperglycemia 08/29/2018  . Arm pain, right 08/29/2018  . Dysphagia, pharyngoesophageal phase   . Dysphagia 03/07/2013  . ACUTE SINUSITIS, UNSPECIFIED 06/29/2009  . GERD, SEVERE 06/29/2009  . Irritable bowel syndrome 06/29/2009  . Hemorrhoids, internal 06/29/2009     Gabriela Eves, PT, DPT 10/25/2018, 11:58 AM  [facility] [address] [place], [place], [address] Phone: [phone]   Fax:  [phone]  Name: ELLENE BLOODSAW MRN: [id number] Date of Birth: [birth year]

## 2018-10-28 ENCOUNTER — Other Ambulatory Visit: Payer: Self-pay | Admitting: *Deleted

## 2018-10-28 MED ORDER — METOPROLOL TARTRATE 25 MG PO TABS: 25.0000 mg | ORAL_TABLET | Freq: Two times a day (BID) | ORAL | 11 refills | Status: DC

## 2018-10-28 NOTE — Progress Notes (Signed)
RX for Metoprolol 25 mg 1 po BID sent into [facility] electronically.

## 2018-10-31 ENCOUNTER — Telehealth: Payer: Self-pay | Admitting: Cardiology

## 2018-10-31 NOTE — Progress Notes (Signed)
Virtual Visit via Telephone Note   This visit type was conducted due to national recommendations for restrictions regarding the COVID-19 Pandemic (e.g. social distancing) in an effort to limit this patient's exposure and mitigate transmission in our community.  Due to her co-morbid illnesses, this patient is at least at moderate risk for complications without adequate follow up.  This format is felt to be most appropriate for this patient at this time.  The patient did not have access to video technology/had technical difficulties with video requiring transitioning to audio format only (telephone).  All issues noted in this document were discussed and addressed.  No physical exam could be performed with this format.  Please refer to the patient's chart for her  consent to telehealth for [facility].   Evaluation Performed:  Follow-up visit  Date:  11/01/2018   ID:  [name], DOB 02-17-1961, MRN [id number]  Patient Location: Home Provider Location: Home  PCP:  Baruch Gouty, FNP  Cardiologist:  Minus Breeding, MD  Electrophysiologist:  None   Chief Complaint:  Chest pain  History of Present Illness:    [name] is a 58 y.o. female with chest pain and edema.    She says that she has been getting chest discomfort off and on for many months.  I described this in the note last week.  The discomfort was atypical.  I sent her for an echo which was unremarkable.  There were no valvular abnormalities or regional wall motion abnormalities .     Since I last talked to her she has been doing relatively OK.  She still gets some fluid but she is taking the 20 mg of Lasix.  I do note that she had a chest x-ray after I talked to her that demonstrated no abnormalities.  She was seen in primary care office.  She is not describing any cough fevers or chills.  She reports she really gets palpitations when she is startled.  For instance she brought up that they pulled the fire alarm at  work and that really causes her to have palpitations and chest pain.  She is otherwise not describing consistent symptoms with exertion.  She is not been having any new shortness of breath but this is sporadic.  The patient does not have symptoms concerning for COVID-19 infection (fever, chills, cough, or new shortness of breath).    Past Medical History:  Diagnosis Date   DDD (degenerative disc disease)    spinal injections   Diverticular disease    Horseshoe kidney    Hypercholesterolemia    Irritable bowel syndrome    Past Surgical History:  Procedure Laterality Date   cervical dysplasia     COLONOSCOPY  07/01/2009   ZCH:YIFOYDXAJOI seen in the ascending and sigmoid colon/small internal hemorrhoids/6-mm sessile polyp/4-mm sessile polyp.3-mm sessile polyp removed. simple adenomas   COLONOSCOPY N/A 03/25/2013   Procedure: COLONOSCOPY;  Surgeon: Danie Binder, MD;  [facility];  Service: Endoscopy;  Laterality: N/A;  10:15   ESOPHAGEAL DILATION N/A 02/04/2015   Procedure: ESOPHAGEAL DILATION;  Surgeon: Danie Binder, MD;  Location: [facility];  Service: Endoscopy;  Laterality: N/A;   ESOPHAGOGASTRODUODENOSCOPY  07/01/2009   NOM:VEHMCN esophagus/dilation to 16 mm possible proximal cervical web   ESOPHAGOGASTRODUODENOSCOPY N/A 02/04/2015   Procedure: ESOPHAGOGASTRODUODENOSCOPY (EGD);  Surgeon: Danie Binder, MD;  [facility];  Service: Endoscopy;  Laterality: N/A;  0800-moved to 845 Office to notify pt  ESOPHAGOGASTRODUODENOSCOPY (EGD) WITH ESOPHAGEAL DILATION N/A 03/25/2013   Procedure: ESOPHAGOGASTRODUODENOSCOPY (EGD) WITH ESOPHAGEAL DILATION;  Surgeon: Danie Binder, MD;  [facility];  Service: Endoscopy;  Laterality: N/A;   TONSILLECTOMY     TUBAL LIGATION     WRIST SURGERY       Current Meds  Medication Sig   aspirin EC 81 MG tablet Take 1 tablet (81 mg total) by mouth daily.   blood glucose meter kit and supplies  Dispense based on patient and insurance preference. Use up to four times daily as directed. (FOR ICD-10 E10.9, E11.9).   DULoxetine (CYMBALTA) 30 MG capsule Take 1 capsule (30 mg total) by mouth daily for 30 days.   furosemide (LASIX) 20 MG tablet Take 1 tablet (20 mg total) by mouth daily.   metFORMIN (GLUCOPHAGE) 500 MG tablet Take 1 tablet (500 mg total) by mouth daily with breakfast.   metoprolol tartrate (LOPRESSOR) 25 MG tablet Take 1 tablet (25 mg total) by mouth 2 (two) times daily.   oxyCODONE (OXY IR/ROXICODONE) 5 MG immediate release tablet Take 5 mg by mouth 3 (three) times daily.    potassium chloride SA (K-DUR,KLOR-CON) 20 MEQ tablet Take 1 tablet (20 mEq total) by mouth daily.     Allergies:   Penicillins and Codeine   Social History   Tobacco Use   Smoking status: Former Smoker    Packs/day: 1.00    Years: 25.00    Pack years: 25.00    Last attempt to quit: 07/10/1997    Years since quitting: 21.3   Smokeless tobacco: Never Used  Substance Use Topics   Alcohol use: No   Drug use: No     Family Hx: The patient's family history includes COPD in her brother and mother; Cancer in her mother; Colon cancer in her brother and father; Diabetes in her sister; Heart disease in her mother; Hyperlipidemia in her mother; Hypertension in her father and mother; Irritable bowel syndrome in her mother and sister; Thyroid disease in her mother.  ROS:   Please see the history of present illness.    As stated in the HPI and negative for all other systems.   Prior CV studies:   The following studies were reviewed today:   Labs/Other Tests and Data Reviewed:    EKG:  No ECG reviewed.  Recent Labs: 10/17/2018: ALT 30; BNP 44.7; BUN 3; Creatinine, Ser 0.67; Hemoglobin 11.2; Platelets 236; Potassium 3.2; Sodium 143; TSH 1.480   Recent Lipid Panel No results found for: CHOL, TRIG, HDL, CHOLHDL, LDLCALC, LDLDIRECT  Wt Readings from Last 3 Encounters:  11/01/18 170 lb 4.8  oz (77.2 kg)  10/23/18 170 lb 3.2 oz (77.2 kg)  10/23/18 175 lb (79.4 kg)     Objective:    Vital Signs:  Wt 170 lb 4.8 oz (77.2 kg)    BMI 31.15 kg/m    NA  ASSESSMENT & PLAN:    CHEST PAIN:     Her chest pain is very atypical.  There was no evidence of an old myocardial infarction.  Given this no further cardiovascular testing is planned at this point.  Her pain is sporadic.  If she continues to have this I could consider bringing her back for a treadmill once the endemic is cleared.  If she has continued chest discomfort she needs to present to the emergency room for more invasive evaluation.    EDEMA:    The echo was unremarkable.  She can continue with the  meds as listed.  I would suggest salt and fluid management.   I will see her back in 3 months to discuss further.   COVID-19 Education: The signs and symptoms of COVID-19 were discussed with the patient and how to seek care for testing (follow up with PCP or arrange E-visit).  The importance of social distancing was discussed today.  Time:   Today, I have spent 10 minutes with the patient with telehealth technology discussing the above problems.     Medication Adjustments/Labs and Tests Ordered: Current medicines are reviewed at length with the patient today.  Concerns regarding medicines are outlined above.   Tests Ordered: No orders of the defined types were placed in this encounter.   Medication Changes: No orders of the defined types were placed in this encounter.   Disposition:  Follow up 3 months  Signed, Minus Breeding, MD  11/01/2018 11:03 AM    [place]

## 2018-11-01 ENCOUNTER — Ambulatory Visit: Payer: PRIVATE HEALTH INSURANCE | Admitting: Physical Therapy

## 2018-11-01 ENCOUNTER — Telehealth (INDEPENDENT_AMBULATORY_CARE_PROVIDER_SITE_OTHER): Payer: PRIVATE HEALTH INSURANCE | Admitting: Cardiology

## 2018-11-01 ENCOUNTER — Encounter: Payer: Self-pay | Admitting: Cardiology

## 2018-11-01 VITALS — Wt 170.3 lb

## 2018-11-01 DIAGNOSIS — M7989 Other specified soft tissue disorders: Secondary | ICD-10-CM | POA: Diagnosis not present

## 2018-11-01 DIAGNOSIS — R072 Precordial pain: Secondary | ICD-10-CM

## 2018-11-01 NOTE — Patient Instructions (Signed)
Medication Instructions:  Continue current medications  If you need a refill on your cardiac medications before your next appointment, please call your pharmacy.  Labwork: None Ordered   Testing/Procedures: None ordered  Follow-Up: You will need a follow up appointment in 3 months.  Please call our office 2 months in advance to schedule this appointment.  You may see Minus Breeding, MD or one of the following Advanced Practice Providers on your designated Care Team:   Rosaria Ferries, PA-C . Jory Sims, DNP, ANP    , Jory Sims, DNP, [place], you and your health needs are our priority.  As part of our continuing mission to provide you with exceptional heart care, we have created designated Provider Care Teams.  These Care Teams include your primary Cardiologist (physician) and Advanced Practice Providers (APPs -  Physician Assistants and Nurse Practitioners) who all work together to provide you with the care you need, when you need it.  Thank you for choosing [facility] at [facility]!!

## 2018-11-06 ENCOUNTER — Telehealth: Payer: PRIVATE HEALTH INSURANCE | Admitting: Cardiology

## 2018-11-19 ENCOUNTER — Other Ambulatory Visit: Payer: Self-pay | Admitting: *Deleted

## 2018-11-26 ENCOUNTER — Encounter: Payer: PRIVATE HEALTH INSURANCE | Admitting: Family Medicine

## 2018-11-29 ENCOUNTER — Encounter: Payer: PRIVATE HEALTH INSURANCE | Admitting: Family Medicine

## 2018-11-29 NOTE — Patient Instructions (Signed)
   Your procedure is scheduled on: 12/10/2018  Report to [facility] at    2;00 AM.  Call this number if you have problems the morning of surgery: [phone]   Remember:              Follow Directions on the letter you received from Your Physician's office regarding the Bowel Prep  :  Take these medicines the morning of surgery with A SIP OF WATER: Cymbalta and metoprolol   Do not wear jewelry, make-up or nail polish.    Do not bring valuables to the hospital.  Contacts, dentures or bridgework may not be worn into surgery.  .   Patients discharged the day of surgery will not be allowed to drive home.     Colonoscopy, Adult, Care After This sheet gives you information about how to care for yourself after your procedure. Your health care provider may also give you more specific instructions. If you have problems or questions, contact your health care provider. What can I expect after the procedure? After the procedure, it is common to have:  A small amount of blood in your stool for 24 hours after the procedure.  Some gas.  Mild abdominal cramping or bloating.  Follow these instructions at home: General instructions   For the first 24 hours after the procedure: ? Do not drive or use machinery. ? Do not sign important documents. ? Do not drink alcohol. ? Do your regular daily activities at a slower pace than normal. ? Eat soft, easy-to-digest foods. ? Rest often.  Take over-the-counter or prescription medicines only as told by your health care provider.  It is up to you to get the results of your procedure. Ask your health care provider, or the department performing the procedure, when your results will be ready. Relieving cramping and bloating  Try walking around when you have cramps or feel bloated.  Apply heat to your abdomen as told by your health care provider. Use a heat source that your health care provider recommends, such as a moist heat pack or a heating pad. ?  Place a towel between your skin and the heat source. ? Leave the heat on for 20-30 minutes. ? Remove the heat if your skin turns bright red. This is especially important if you are unable to feel pain, heat, or cold. You may have a greater risk of getting burned. Eating and drinking  Drink enough fluid to keep your urine clear or pale yellow.  Resume your normal diet as instructed by your health care provider. Avoid heavy or fried foods that are hard to digest.  Avoid drinking alcohol for as long as instructed by your health care provider. Contact a health care provider if:  You have blood in your stool 2-3 days after the procedure. Get help right away if:  You have more than a small spotting of blood in your stool.  You pass large blood clots in your stool.  Your abdomen is swollen.  You have nausea or vomiting.  You have a fever.  You have increasing abdominal pain that is not relieved with medicine. This information is not intended to replace advice given to you by your health care provider. Make sure you discuss any questions you have with your health care provider. Document Released: 02/08/2004 Document Revised: 03/20/2016 Document Reviewed: 09/07/2015 Elsevier Interactive Patient Education  [employer].

## 2018-12-03 ENCOUNTER — Encounter (HOSPITAL_COMMUNITY)
Admission: RE | Admit: 2018-12-03 | Discharge: 2018-12-03 | Disposition: A | Discharge status: Home / Self Care | Payer: PRIVATE HEALTH INSURANCE | Source: Ambulatory Visit | Attending: Gastroenterology | Admitting: Gastroenterology

## 2018-12-03 ENCOUNTER — Telehealth: Payer: Self-pay | Admitting: *Deleted

## 2018-12-03 ENCOUNTER — Encounter (HOSPITAL_COMMUNITY): Payer: Self-pay

## 2018-12-03 NOTE — Telephone Encounter (Signed)
-----   Message from Encarnacion Chu, RN sent at 12/03/2018  2:06 PM EDT ----- Regarding: No show for PAT Hey Mindy,     [name] did not show for her PAT.

## 2018-12-03 NOTE — Telephone Encounter (Signed)
Called patient. She states she did not know her pre-op was scheduled for today. I advised patient on 3/3 we spoke with her and scheduled her procedure for 6/2. She states she did not have any of this wrote down. I also advised her we mailed her instructions with pre-op on 3/3. She states she never received anything. Confirmed mailing address was correct. She also has not picked prep up from pharmacy. She states she did not know she was supposed to. I again advised her she was informed of all of this when she was scheduled. I advised patient to check with her pharmacy and if they do not have Rx to call us and we will resend this in. She voiced understanding. She was provided with endo's # to call and r/s pre-op. Patient also aware she will need COVID-19 testing and will need to remain quarantined. Checked history and rx was sent to pharmacy on 3/3 to [facility]. I have faxed instructions to [facility] as well.

## 2018-12-04 NOTE — Telephone Encounter (Addendum)
REVIEWED. IF SHE MISSES HER PAT, SHE WILL NEED TO BEE SEEN IN THE [facility] PRIOR TO RSC'ING HER TCS.

## 2018-12-06 ENCOUNTER — Encounter (HOSPITAL_COMMUNITY)
Admission: RE | Admit: 2018-12-06 | Discharge: 2018-12-06 | Disposition: A | Discharge status: Home / Self Care | Payer: PRIVATE HEALTH INSURANCE | Source: Ambulatory Visit | Attending: Gastroenterology | Admitting: Gastroenterology

## 2018-12-06 ENCOUNTER — Other Ambulatory Visit: Payer: Self-pay

## 2018-12-06 ENCOUNTER — Other Ambulatory Visit (HOSPITAL_COMMUNITY)
Admission: RE | Admit: 2018-12-06 | Discharge: 2018-12-06 | Disposition: A | Discharge status: Home / Self Care | Payer: PRIVATE HEALTH INSURANCE | Source: Ambulatory Visit | Attending: Gastroenterology | Admitting: Gastroenterology

## 2018-12-06 ENCOUNTER — Encounter (HOSPITAL_COMMUNITY): Payer: Self-pay

## 2018-12-06 DIAGNOSIS — Z01812 Encounter for preprocedural laboratory examination: Secondary | ICD-10-CM | POA: Insufficient documentation

## 2018-12-06 DIAGNOSIS — Z1159 Encounter for screening for other viral diseases: Secondary | ICD-10-CM | POA: Insufficient documentation

## 2018-12-06 HISTORY — DX: Type 2 diabetes mellitus without complications: E11.9

## 2018-12-06 HISTORY — DX: Gastro-esophageal reflux disease without esophagitis: K21.9

## 2018-12-06 LAB — BASIC METABOLIC PANEL
Anion gap: 12 (ref 5–15)
BUN: 11 mg/dL (ref 6–20)
CO2: 21 mmol/L — ABNORMAL LOW (ref 22–32)
Calcium: 9.2 mg/dL (ref 8.9–10.3)
Chloride: 105 mmol/L (ref 98–111)
Creatinine, Ser: 0.78 mg/dL (ref 0.44–1.00)
GFR calc Af Amer: >60 mL/min (ref >60)
GFR calc non Af Amer: >60 mL/min (ref >60)
Glucose, Bld: 184 mg/dL — ABNORMAL HIGH (ref 70–99)
Potassium: 3.4 mmol/L — ABNORMAL LOW (ref 3.5–5.1)
Sodium: 138 mmol/L (ref 135–145)

## 2018-12-06 LAB — CBC WITH DIFFERENTIAL/PLATELET
Abs Immature Granulocytes: 0.02 10*3/uL (ref 0.00–0.07)
Basophils Absolute: 0 10*3/uL (ref 0.0–0.1)
Basophils Relative: 1 %
Eosinophils Absolute: 0.2 10*3/uL (ref 0.0–0.5)
Eosinophils Relative: 3 %
HCT: 36.6 % (ref 36.0–46.0)
Hemoglobin: 11.8 g/dL — ABNORMAL LOW (ref 12.0–15.0)
Immature Granulocytes: 0 %
Lymphocytes Relative: 29 %
Lymphs Abs: 1.9 10*3/uL (ref 0.7–4.0)
MCH: 28.3 pg (ref 26.0–34.0)
MCHC: 32.2 g/dL (ref 30.0–36.0)
MCV: 87.8 fL (ref 80.0–100.0)
Monocytes Absolute: 0.4 10*3/uL (ref 0.1–1.0)
Monocytes Relative: 6 %
Neutro Abs: 3.9 10*3/uL (ref 1.7–7.7)
Neutrophils Relative %: 61 %
Platelets: 157 10*3/uL (ref 150–400)
RBC: 4.17 MIL/uL (ref 3.87–5.11)
RDW: 14.3 % (ref 11.5–15.5)
WBC: 6.4 10*3/uL (ref 4.0–10.5)
nRBC: 0 % (ref 0.0–0.2)

## 2018-12-07 LAB — NOVEL CORONAVIRUS, NAA (HOSP ORDER, SEND-OUT TO REF LAB; TAT 18-24 HRS): SARS-CoV-2, NAA: NOT DETECTED

## 2018-12-10 ENCOUNTER — Ambulatory Visit (HOSPITAL_COMMUNITY)
Admission: RE | Admit: 2018-12-10 | Discharge: 2018-12-10 | Disposition: A | Discharge status: Home / Self Care | Payer: PRIVATE HEALTH INSURANCE | Attending: Gastroenterology | Admitting: Gastroenterology

## 2018-12-10 ENCOUNTER — Ambulatory Visit (HOSPITAL_COMMUNITY): Payer: PRIVATE HEALTH INSURANCE | Admitting: Anesthesiology

## 2018-12-10 ENCOUNTER — Other Ambulatory Visit: Payer: Self-pay

## 2018-12-10 ENCOUNTER — Encounter (HOSPITAL_COMMUNITY): Payer: Self-pay | Admitting: Anesthesiology

## 2018-12-10 ENCOUNTER — Encounter (HOSPITAL_COMMUNITY)
Admission: RE | Disposition: A | Discharge status: Home / Self Care | Payer: Self-pay | Source: Home / Self Care | Attending: Gastroenterology

## 2018-12-10 DIAGNOSIS — K222 Esophageal obstruction: Secondary | ICD-10-CM | POA: Insufficient documentation

## 2018-12-10 DIAGNOSIS — Z833 Family history of diabetes mellitus: Secondary | ICD-10-CM | POA: Insufficient documentation

## 2018-12-10 DIAGNOSIS — Z79899 Other long term (current) drug therapy: Secondary | ICD-10-CM | POA: Insufficient documentation

## 2018-12-10 DIAGNOSIS — K635 Polyp of colon: Secondary | ICD-10-CM | POA: Diagnosis not present

## 2018-12-10 DIAGNOSIS — E7800 Pure hypercholesterolemia, unspecified: Secondary | ICD-10-CM | POA: Diagnosis not present

## 2018-12-10 DIAGNOSIS — K625 Hemorrhage of anus and rectum: Secondary | ICD-10-CM

## 2018-12-10 DIAGNOSIS — K589 Irritable bowel syndrome without diarrhea: Secondary | ICD-10-CM | POA: Diagnosis not present

## 2018-12-10 DIAGNOSIS — E119 Type 2 diabetes mellitus without complications: Secondary | ICD-10-CM | POA: Diagnosis not present

## 2018-12-10 DIAGNOSIS — K5710 Diverticulosis of small intestine without perforation or abscess without bleeding: Secondary | ICD-10-CM | POA: Diagnosis not present

## 2018-12-10 DIAGNOSIS — K219 Gastro-esophageal reflux disease without esophagitis: Secondary | ICD-10-CM | POA: Diagnosis not present

## 2018-12-10 DIAGNOSIS — D123 Benign neoplasm of transverse colon: Secondary | ICD-10-CM | POA: Insufficient documentation

## 2018-12-10 DIAGNOSIS — Z87891 Personal history of nicotine dependence: Secondary | ICD-10-CM | POA: Insufficient documentation

## 2018-12-10 DIAGNOSIS — R1310 Dysphagia, unspecified: Secondary | ICD-10-CM | POA: Insufficient documentation

## 2018-12-10 DIAGNOSIS — Z86010 Personal history of colonic polyps: Secondary | ICD-10-CM | POA: Diagnosis not present

## 2018-12-10 DIAGNOSIS — Z7982 Long term (current) use of aspirin: Secondary | ICD-10-CM | POA: Insufficient documentation

## 2018-12-10 DIAGNOSIS — Z7984 Long term (current) use of oral hypoglycemic drugs: Secondary | ICD-10-CM | POA: Diagnosis not present

## 2018-12-10 DIAGNOSIS — K921 Melena: Secondary | ICD-10-CM | POA: Insufficient documentation

## 2018-12-10 DIAGNOSIS — K2970 Gastritis, unspecified, without bleeding: Secondary | ICD-10-CM | POA: Diagnosis not present

## 2018-12-10 DIAGNOSIS — Z880 Allergy status to penicillin: Secondary | ICD-10-CM | POA: Insufficient documentation

## 2018-12-10 DIAGNOSIS — K648 Other hemorrhoids: Secondary | ICD-10-CM | POA: Insufficient documentation

## 2018-12-10 DIAGNOSIS — Z885 Allergy status to narcotic agent status: Secondary | ICD-10-CM | POA: Diagnosis not present

## 2018-12-10 DIAGNOSIS — K449 Diaphragmatic hernia without obstruction or gangrene: Secondary | ICD-10-CM | POA: Insufficient documentation

## 2018-12-10 DIAGNOSIS — K5730 Diverticulosis of large intestine without perforation or abscess without bleeding: Secondary | ICD-10-CM | POA: Insufficient documentation

## 2018-12-10 HISTORY — PX: ESOPHAGOGASTRODUODENOSCOPY (EGD) WITH PROPOFOL: SHX5813

## 2018-12-10 HISTORY — PX: COLONOSCOPY WITH PROPOFOL: SHX5780

## 2018-12-10 HISTORY — PX: POLYPECTOMY: SHX5525

## 2018-12-10 HISTORY — PX: SAVORY DILATION: SHX5439

## 2018-12-10 LAB — GLUCOSE, CAPILLARY: Glucose-Capillary: 120 mg/dL — ABNORMAL HIGH (ref 70–99)

## 2018-12-10 SURGERY — COLONOSCOPY WITH PROPOFOL
Anesthesia: Monitor Anesthesia Care

## 2018-12-10 MED ORDER — MIDAZOLAM HCL 2 MG/2ML IJ SOLN
INTRAMUSCULAR | Status: AC
  Filled 2018-12-10: qty 2

## 2018-12-10 MED ORDER — LACTATED RINGERS IV SOLN
INTRAVENOUS | Status: DC
  Administered 2018-12-10: 1000 mL via INTRAVENOUS

## 2018-12-10 MED ORDER — PROPOFOL 10 MG/ML IV BOLUS
INTRAVENOUS | Status: AC
  Filled 2018-12-10: qty 40

## 2018-12-10 MED ORDER — CHLORHEXIDINE GLUCONATE CLOTH 2 % EX PADS: 6.0000 | MEDICATED_PAD | Freq: Once | CUTANEOUS | Status: DC

## 2018-12-10 MED ORDER — MEPERIDINE HCL 50 MG/ML IJ SOLN: 6.2500 mg | INTRAMUSCULAR | Status: DC | PRN

## 2018-12-10 MED ORDER — LACTATED RINGERS IV SOLN: INTRAVENOUS | Status: DC

## 2018-12-10 MED ORDER — PROPOFOL 500 MG/50ML IV EMUL
INTRAVENOUS | Status: DC | PRN
  Administered 2018-12-10: 150 ug/kg/min via INTRAVENOUS
  Administered 2018-12-10: 10:00:00 via INTRAVENOUS

## 2018-12-10 MED ORDER — MIDAZOLAM HCL 5 MG/5ML IJ SOLN
INTRAMUSCULAR | Status: DC | PRN
  Administered 2018-12-10: 2 mg via INTRAVENOUS

## 2018-12-10 MED ORDER — PROPOFOL 10 MG/ML IV BOLUS
INTRAVENOUS | Status: DC | PRN
  Administered 2018-12-10: 30 mg via INTRAVENOUS
  Administered 2018-12-10 (×2): 15 mg via INTRAVENOUS

## 2018-12-10 MED ORDER — PROMETHAZINE HCL 25 MG/ML IJ SOLN: 6.2500 mg | INTRAMUSCULAR | Status: DC | PRN

## 2018-12-10 MED ORDER — KETAMINE HCL 50 MG/5ML IJ SOSY
PREFILLED_SYRINGE | INTRAMUSCULAR | Status: AC
  Filled 2018-12-10: qty 5

## 2018-12-10 MED ORDER — HYDROMORPHONE HCL 1 MG/ML IJ SOLN: 0.2500 mg | INTRAMUSCULAR | Status: DC | PRN

## 2018-12-10 MED ORDER — KETAMINE HCL 10 MG/ML IJ SOLN
INTRAMUSCULAR | Status: DC | PRN
  Administered 2018-12-10 (×2): 5 mg via INTRAVENOUS
  Administered 2018-12-10: 10 mg via INTRAVENOUS

## 2018-12-10 MED ORDER — HYDROCODONE-ACETAMINOPHEN 7.5-325 MG PO TABS: 1.0000 | ORAL_TABLET | Freq: Once | ORAL | Status: DC | PRN

## 2018-12-10 NOTE — Op Note (Signed)
[facility] Patient Name: [name] Procedure Date: 12/10/2018 10:13 AM MRN: [id number] Date of Birth: 09/24/1960 Attending MD: Barney Drain MD, MD CSN: [id number] Age: 58 Admit Type: Outpatient Procedure:                Upper GI endoscopy WITH ESOPHAGEAL DILATION Indications:              Dysphagia, Therapeutic procedure Providers:                Barney Drain MD, MD, Randa Spike, Technician Referring MD:             Connye Burkitt. Rakes Medicines:                Propofol per Anesthesia Complications:            No immediate complications. Estimated Blood Loss:     Estimated blood loss was minimal. Procedure:                Pre-Anesthesia Assessment:                           - Prior to the procedure, a History and Physical                            was performed, and patient medications and                            allergies were reviewed. The patient's tolerance of                            previous anesthesia was also reviewed. The risks                            and benefits of the procedure and the sedation                            options and risks were discussed with the patient.                            All questions were answered, and informed consent                            was obtained. Prior Anticoagulants: The patient has                            taken no previous anticoagulant or antiplatelet                            agents except for aspirin. ASA Grade Assessment: II                            - A patient with mild systemic disease. After                            reviewing the risks and benefits, the patient was  deemed in satisfactory condition to undergo the                            procedure. After obtaining informed consent, the                            endoscope was passed under direct vision.                            Throughout the procedure, the patient's blood                            pressure, pulse, and  oxygen saturations were                            monitored continuously. The GIF-H190 ([id number]) was                            introduced through the mouth, and advanced to the                            second part of duodenum. The upper GI endoscopy was                            accomplished without difficulty. The patient                            tolerated the procedure well. After obtaining                            informed consent, the endoscope was passed under                            direct vision. Throughout the procedure, the                            patient's blood pressure, pulse, and oxygen                            saturations were monitored continuously. Scope In: 10:16:01 AM Scope Out: 10:22:43 AM Total Procedure Duration: 0 hours 6 minutes 42 seconds  Findings:      A moderate Schatzki ring was found at the gastroesophageal junction. A       guidewire was placed and the scope was withdrawn. Dilation was performed       with a Savary dilator with mild resistance at 15 mm, 16 mm and 17 mm.       Estimated blood loss was minimal.      Localized mild inflammation characterized by congestion (edema) and       erythema was found in the gastric body and in the gastric antrum.      A small hiatal hernia was present.      A medium non-bleeding diverticulum was found in the duodenal bulb.      The second portion of the duodenum was normal. Impression:               -  Moderate Schatzki ring. Dilated.                           - MILD Gastritis.                           - Small hiatal hernia.                           - Non-bleeding duodenal diverticulum. Moderate Sedation:      Per Anesthesia Care Recommendation:           - Patient has a contact number available for                            emergencies. The signs and symptoms of potential                            delayed complications were discussed with the                            patient. Return to normal  activities tomorrow.                            Written discharge instructions were provided to the                            patient.                           - High fiber diet and low fat diet.                           - Continue present medications.                           - Await pathology results.                           - Return to my office in 4 months. Procedure Code(s):        --- Professional ---                           [phone], Esophagogastroduodenoscopy, flexible,                            transoral; with insertion of guide wire followed by                            passage of dilator(s) through esophagus over guide                            wire Diagnosis Code(s):        --- Professional ---                           K22.2, Esophageal obstruction  K29.70, Gastritis, unspecified, without bleeding                           K44.9, Diaphragmatic hernia without obstruction or                            gangrene                           R13.10, Dysphagia, unspecified                           K57.10, Diverticulosis of small intestine without                            perforation or abscess without bleeding CPT copyright 2019 American Medical Association. All rights reserved. The codes documented in this report are preliminary and upon coder review may  be revised to meet current compliance requirements. Barney Drain, MD Barney Drain MD, MD 12/10/2018 10:53:20 AM This report has been signed electronically. Number of Addenda: 0

## 2018-12-10 NOTE — Op Note (Signed)
[facility] Patient Name: [name] Procedure Date: 12/10/2018 8:42 AM MRN: [id number] Date of Birth: 09/04/1960 Attending MD: Barney Drain MD, MD CSN: [id number] Age: 58 Admit Type: Outpatient Procedure:                Colonoscopy WITH COLD SNARE POLYPECTOMY Indications:              Hematochezia, Personal history of colonic                            polyps-LAST TCS 2014 Providers:                Barney Drain MD, MD, Randa Spike, Technician Referring MD:             Connye Burkitt. Rakes Medicines:                Propofol per Anesthesia Complications:            No immediate complications. Estimated Blood Loss:     Estimated blood loss was minimal. Procedure:                Pre-Anesthesia Assessment:                           - Prior to the procedure, a History and Physical                            was performed, and patient medications and                            allergies were reviewed. The patient's tolerance of                            previous anesthesia was also reviewed. The risks                            and benefits of the procedure and the sedation                            options and risks were discussed with the patient.                            All questions were answered, and informed consent                            was obtained. Prior Anticoagulants: The patient has                            taken no previous anticoagulant or antiplatelet                            agents except for aspirin. ASA Grade Assessment: II                            - A patient with mild systemic disease. After  reviewing the risks and benefits, the patient was                            deemed in satisfactory condition to undergo the                            procedure. After obtaining informed consent, the                            colonoscope was passed under direct vision.                            Throughout the procedure, the patient's  blood                            pressure, pulse, and oxygen saturations were                            monitored continuously. The colonoscopy was                            somewhat difficult due to a tortuous colon.                            Successful completion of the procedure was aided by                            straightening and shortening the scope to obtain                            bowel loop reduction. The PCF-[id number] ([id number])                            scope was introduced through the anus and advanced                            to the 15 cm into the ileum. The patient tolerated                            the procedure well. The quality of the bowel                            preparation was good. The terminal ileum, ileocecal                            valve, appendiceal orifice, and rectum were                            photographed. Scope In: 9:57:42 AM Scope Out: 10:09:14 AM Scope Withdrawal Time: 0 hours 9 minutes 45 seconds  Total Procedure Duration: 0 hours 11 minutes 32 seconds  Findings:      The terminal ileum appeared normal.      A 4 mm polyp was found in the splenic flexure. The  polyp was sessile.       The polyp was removed with a cold snare. Resection and retrieval were       complete.      Multiple small and large-mouthed diverticula were found in the       recto-sigmoid colon, sigmoid colon, descending colon, transverse colon       and ascending colon.      Internal hemorrhoids were found. The hemorrhoids were large.      The recto-sigmoid colon and sigmoid colon were mildly tortuous. Impression:               - The examined portion of the ileum was normal.                           - One 4 mm polyp at the splenic flexure, removed                            with a cold snare. Resected and retrieved.                           - MODERATE Diverticulosis in the recto-sigmoid                            colon, in the sigmoid colon, in the descending                             colon, in the transverse colon and in the ascending                            colon.                           - RECTAL BLEEDING DUE TO Internal hemorrhoids.                           - Tortuous LEFT colon. Moderate Sedation:      Per Anesthesia Care Recommendation:           - Patient has a contact number available for                            emergencies. The signs and symptoms of potential                            delayed complications were discussed with the                            patient. Return to normal activities tomorrow.                            Written discharge instructions were provided to the                            patient.                           - High  fiber diet.                           - Continue present medications.                           - Await pathology results.                           - Return to GI office in 4 months.                           - Repeat colonoscopy in 5 years for surveillance. Procedure Code(s):        --- Professional ---                           [phone], Colonoscopy, flexible; with removal of                            tumor(s), polyp(s), or other lesion(s) by snare                            technique Diagnosis Code(s):        --- Professional ---                           K64.8, Other hemorrhoids                           K63.5, Polyp of colon                           K92.1, Melena (includes Hematochezia)                           K57.30, Diverticulosis of large intestine without                            perforation or abscess without bleeding                           Q43.8, Other specified congenital malformations of                            intestine CPT copyright 2019 [employer] Association. All rights reserved. The codes documented in this report are preliminary and upon coder review may  be revised to meet current compliance requirements. Barney Drain, MD Barney Drain MD,  MD 12/10/2018 10:26:52 AM This report has been signed electronically. Number of Addenda: 0

## 2018-12-10 NOTE — Discharge Instructions (Signed)
You had 1 polyp removed. YOU HAVE DIVERTICULOSIS IN YOUR RIGHT AND LEFT COLON. YOU HAVE A SMALL HIATAL HERNIA. I STRETCHED YOUR ESOPHAGUS DUE TO YOUR PROBLEMS SWALLOWING.   DRINK WATER TO KEEP YOUR URINE LIGHT YELLOW.  CONTINUE YOUR WEIGHT LOSS EFFORTS.  FOLLOW A HIGH FIBER DIET. AVOID ITEMS THAT CAUSE BLOATING. SEE INFO BELOW.   YOUR BIOPSY RESULTS WILL BE BACK IN 5 BUSINESS DAYS.   Follow up in 4 mos.  Next colonoscopy in 5 years.   ENDOSCOPY Care After Read the instructions outlined below and refer to this sheet in the next week. These discharge instructions provide you with general information on caring for yourself after you leave the hospital. While your treatment has been planned according to the most current medical practices available, unavoidable complications occasionally occur. If you have any problems or questions after discharge, call DR. Rojean Ige, [phone].  ACTIVITY  You may resume your regular activity, but move at a slower pace for the next 24 hours.   Take frequent rest periods for the next 24 hours.   Walking will help get rid of the air and reduce the bloated feeling in your belly (abdomen).   No driving for 24 hours (because of the medicine (anesthesia) used during the test).   You may shower.   Do not sign any important legal documents or operate any machinery for 24 hours (because of the anesthesia used during the test).    NUTRITION  Drink plenty of fluids.   You may resume your normal diet as instructed by your doctor.   Begin with a light meal and progress to your normal diet. Heavy or fried foods are harder to digest and may make you feel sick to your stomach (nauseated).   Avoid alcoholic beverages for 24 hours or as instructed.    MEDICATIONS  You may resume your normal medications.   WHAT YOU CAN EXPECT TODAY  Some feelings of bloating in the abdomen.   Passage of more gas than usual.   Spotting of blood in your stool or on  the toilet paper  .  IF YOU HAD POLYPS REMOVED DURING THE ENDOSCOPY:  Eat a soft diet IF YOU HAVE NAUSEA, BLOATING, ABDOMINAL PAIN, OR VOMITING.    FINDING OUT THE RESULTS OF YOUR TEST Not all test results are available during your visit. DR. Oneida Alar WILL CALL YOU WITHIN 14 DAYS OF YOUR PROCEDUE WITH YOUR RESULTS. Do not assume everything is normal if you have not heard from DR. Jailene Cupit, CALL HER OFFICE AT [phone].  SEEK IMMEDIATE MEDICAL ATTENTION AND CALL THE OFFICE: [phone] IF:  You have more than a spotting of blood in your stool.   Your belly is swollen (abdominal distention).   You are nauseated or vomiting.   You have a temperature over 101F.   You have abdominal pain or discomfort that is severe or gets worse throughout the day.   High-Fiber Diet A high-fiber diet changes your normal diet to include more whole grains, legumes, fruits, and vegetables. Changes in the diet involve replacing refined carbohydrates with unrefined foods. The calorie level of the diet is essentially unchanged. The Dietary Reference Intake (recommended amount) for adult males is 38 grams per day. For adult females, it is 25 grams per day. Pregnant and lactating women should consume 28 grams of fiber per day. Fiber is the intact part of a plant that is not broken down during digestion. Functional fiber is fiber that has been isolated from the plant to  provide a beneficial effect in the body.  PURPOSE  Increase stool bulk.   Ease and regulate bowel movements.   Lower cholesterol.  REDUCE RISK OF COLON CANCER  INDICATIONS THAT YOU NEED MORE FIBER  Constipation and hemorrhoids.   Uncomplicated diverticulosis (intestine condition) and irritable bowel syndrome.   Weight management.   As a protective measure against hardening of the arteries (atherosclerosis), diabetes, and cancer.   GUIDELINES FOR INCREASING FIBER IN THE DIET  Start adding fiber to the diet slowly. A gradual increase  of about 5 more grams (2 slices of whole-wheat bread, 2 servings of most fruits or vegetables, or 1 bowl of high-fiber cereal) per day is best. Too rapid an increase in fiber may result in constipation, flatulence, and bloating.   Drink enough water and fluids to keep your urine clear or pale yellow. Water, juice, or caffeine-free drinks are recommended. Not drinking enough fluid may cause constipation.   Eat a variety of high-fiber foods rather than one type of fiber.   Try to increase your intake of fiber through using high-fiber foods rather than fiber pills or supplements that contain small amounts of fiber.   The goal is to change the types of food eaten. Do not supplement your present diet with high-fiber foods, but replace foods in your present diet.   INCLUDE A VARIETY OF FIBER SOURCES  Replace refined and processed grains with whole grains, canned fruits with fresh fruits, and incorporate other fiber sources. White rice, white breads, and most bakery goods contain little or no fiber.   Brown whole-grain rice, buckwheat oats, and many fruits and vegetables are all good sources of fiber. These include: broccoli, Brussels sprouts, cabbage, cauliflower, beets, sweet potatoes, white potatoes (skin on), carrots, tomatoes, eggplant, squash, berries, fresh fruits, and dried fruits.   Cereals appear to be the richest source of fiber. Cereal fiber is found in whole grains and bran. Bran is the fiber-rich outer coat of cereal grain, which is largely removed in refining. In whole-grain cereals, the bran remains. In breakfast cereals, the largest amount of fiber is found in those with "bran" in their names. The fiber content is sometimes indicated on the label.   You may need to include additional fruits and vegetables each day.   In baking, for 1 cup white flour, you may use the following substitutions:   1 cup whole-wheat flour minus 2 tablespoons.   1/2 cup white flour plus 1/2 cup whole-wheat  flour.    Polyps, Colon  A polyp is extra tissue that grows inside your body. Colon polyps grow in the large intestine. The large intestine, also called the colon, is part of your digestive system. It is a long, hollow tube at the end of your digestive tract where your body makes and stores stool. Most polyps are not dangerous. They are benign. This means they are not cancerous. But over time, some types of polyps can turn into cancer. Polyps that are smaller than a pea are usually not harmful. But larger polyps could someday become or may already be cancerous. To be safe, doctors remove all polyps and test them.   PREVENTION There is not one sure way to prevent polyps. You might be able to lower your risk of getting them if you:  Eat more fruits and vegetables and less fatty food.   Do not smoke.   Avoid alcohol.   Exercise every day.   Lose weight if you are overweight.   Eating more calcium  and folate can also lower your risk of getting polyps. Some foods that are rich in calcium are milk, cheese, and broccoli. Some foods that are rich in folate are chickpeas, kidney beans, and spinach.    Hiatal Hernia A hiatal hernia occurs when a part of the stomach slides above the diaphragm. The diaphragm is the thin muscle separating the belly (abdomen) from the chest. A hiatal hernia can be something you are born with or develop over time. Hiatal hernias may allow stomach acid to flow back into your esophagus, the tube which carries food from your mouth to your stomach. If this acid causes problems it is called GERD (gastro-esophageal reflux disease).   HOME CARE INSTRUCTIONS  Try to achieve and maintain an ideal body weight.   Avoid drinking alcoholic beverages.   DO NOT smokE.   Do not wear tight clothing around your chest or stomach.   Eat smaller meals and eat more frequently. This keeps your stomach from getting too full. Eat slowly.   Do not lie down for 2 or 3 hours after eating.  Do not eat or drink anything 1 to 2 hours before going to bed.   Avoid caffeine beverages (colas, coffee, cocoa, tea), fatty foods, citrus fruits and all other foods and drinks that contain acid and that seem to increase the problems.   Avoid bending over, especially after eating OR STRAINING. Anything that increases the pressure in your belly increases the amount of acid that may be pushed up into your esophagus.

## 2018-12-10 NOTE — Anesthesia Postprocedure Evaluation (Signed)
Anesthesia Post Note  Patient: [name]  Procedure(s) Performed: COLONOSCOPY WITH PROPOFOL (N/A ) ESOPHAGOGASTRODUODENOSCOPY (EGD) WITH PROPOFOL SAVORY DILATION POLYPECTOMY  Patient location during evaluation: PACU Anesthesia Type: MAC Level of consciousness: awake and alert and patient cooperative Pain management: pain level controlled Vital Signs Assessment: post-procedure vital signs reviewed and stable Respiratory status: spontaneous breathing, nonlabored ventilation and respiratory function stable Cardiovascular status: blood pressure returned to baseline Postop Assessment: no apparent nausea or vomiting Anesthetic complications: no     Last Vitals:  Vitals:   12/10/18 1032 12/10/18 1106  BP: 115/68 (!) 152/81  Pulse: 90 81  Resp: 17   Temp: (!) 36.4 C 36.4 C  SpO2: 96% 96%    Last Pain:  Vitals:   12/10/18 1106  TempSrc: Axillary  PainSc: 1                  Lucca Greggs J

## 2018-12-10 NOTE — H&P (Signed)
Primary Care Physician:  Rakes, Linda M, FNP Primary Gastroenterologist:  Dr. Fields  Pre-Procedure History & Physical: HPI:  [name] is a 58 y.o. female here for DYSPHAGIA/brbpr.  Past Medical History:  Diagnosis Date  . DDD (degenerative disc disease)    spinal injections  . Diabetes mellitus without complication (HCC)   . Diverticular disease   . GERD (gastroesophageal reflux disease)   . Horseshoe kidney   . Hypercholesterolemia   . Irritable bowel syndrome     Past Surgical History:  Procedure Laterality Date  . cervical dysplasia    . COLONOSCOPY  07/01/2009   SLF:diverticula seen in the ascending and sigmoid colon/small internal hemorrhoids/6-mm sessile polyp/4-mm sessile polyp.3-mm sessile polyp removed. simple adenomas  . COLONOSCOPY N/A 03/25/2013   Procedure: COLONOSCOPY;  Surgeon: Sandi L Fields, MD;  Location: [facility];  Service: Endoscopy;  Laterality: N/A;  10:15  . ESOPHAGEAL DILATION N/A 02/04/2015   Procedure: ESOPHAGEAL DILATION;  Surgeon: Sandi L Fields, MD;  Location: [facility];  Service: Endoscopy;  Laterality: N/A;  . ESOPHAGOGASTRODUODENOSCOPY  07/01/2009   SLF:normal esophagus/dilation to 16 mm possible proximal cervical web  . ESOPHAGOGASTRODUODENOSCOPY N/A 02/04/2015   Procedure: ESOPHAGOGASTRODUODENOSCOPY (EGD);  Surgeon: Sandi L Fields, MD;  Location: [facility];  Service: Endoscopy;  Laterality: N/A;  0800-moved to 845 Office to notify pt  . ESOPHAGOGASTRODUODENOSCOPY (EGD) WITH ESOPHAGEAL DILATION N/A 03/25/2013   Procedure: ESOPHAGOGASTRODUODENOSCOPY (EGD) WITH ESOPHAGEAL DILATION;  Surgeon: Sandi L Fields, MD;  Location: [facility];  Service: Endoscopy;  Laterality: N/A;  . TONSILLECTOMY    . TUBAL LIGATION    . WRIST SURGERY      Prior to Admission medications   Medication Sig Start Date End Date Taking? Authorizing Provider  aspirin EC 81 MG tablet Take 1 tablet (81 mg total) by mouth daily. 10/23/18  Yes Hochrein,  James, MD  blood glucose meter kit and supplies Dispense based on patient and insurance preference. Use up to four times daily as directed. (FOR ICD-10 E10.9, E11.9). 10/23/18  Yes Rakes, Linda M, FNP  furosemide (LASIX) 20 MG tablet Take 1 tablet (20 mg total) by mouth daily. 10/18/18  Yes Rakes, Linda M, FNP  metFORMIN (GLUCOPHAGE) 500 MG tablet Take 1 tablet (500 mg total) by mouth daily with breakfast. 10/18/18  Yes Rakes, Linda M, FNP  metoprolol tartrate (LOPRESSOR) 25 MG tablet Take 1 tablet (25 mg total) by mouth 2 (two) times daily. 10/28/18  Yes Hochrein, James, MD  potassium chloride SA (K-DUR,KLOR-CON) 20 MEQ tablet Take 1 tablet (20 mEq total) by mouth daily. 10/18/18  Yes Rakes, Linda M, FNP  simethicone (MYLICON) 125 MG chewable tablet Chew 125 mg by mouth every 6 (six) hours as needed for flatulence.   Yes [provider]  DULoxetine (CYMBALTA) 30 MG capsule Take 1 capsule (30 mg total) by mouth daily for 30 days. 10/23/18 11/22/18  Rakes, Linda M, FNP    Allergies as of 09/10/2018 - Review Complete 08/29/2018  Allergen Reaction Noted  . Penicillins Anaphylaxis and Itching 07/24/2011  . Codeine Nausea And Vomiting 09/22/2014    Family History  Problem Relation Age of Onset  . Colon cancer Brother        mid 50s, deceased  . Colon cancer Father        diagnosed at 62, deceased  . Hypertension Father   . Hypertension Mother   . Hyperlipidemia Mother   . COPD Mother   . Cancer Mother          lung  . Heart disease Mother   . Thyroid disease Mother   . Irritable bowel syndrome Mother   . Diabetes Sister   . Irritable bowel syndrome Sister   . COPD Brother     Social History   Socioeconomic History  . Marital status: Legally Separated    Spouse name: Not on file  . Number of children: Not on file  . Years of education: Not on file  . Highest education level: Not on file  Occupational History  . 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 Financial resource strain: Not on file  . Food insecurity:    Worry: Not on file    Inability: Not on file  . Transportation needs:    Medical: Not on file    Non-medical: Not on file  Tobacco Use  . Smoking status: Former Smoker    Packs/day: 1.00    Years: 25.00    Pack years: 25.00    Last attempt to quit: 07/10/1997    Years since quitting: 21.4  . Smokeless tobacco: Never Used  Substance and Sexual Activity  . Alcohol use: No  . Drug use: No  . Sexual activity: Not Currently  Lifestyle  . Physical activity:    Days per week: Not on file    Minutes per session: Not on file  . Stress: Not on file  Relationships  . Social connections:    Talks on phone: Not on file    Gets together: Not on file    Attends religious service: Not on file    Active member of club or organization: Not on file    Attends meetings of clubs or organizations: Not on file    Relationship status: Not on file  . Intimate partner violence:    Fear of current or ex partner: Not on file    Emotionally abused: Not on file    Physically abused: Not on file    Forced sexual activity: Not on file  Other Topics Concern  . Not on file  Social History Narrative   Lives alone.  CNA at [employer].      Review of Systems: See HPI, otherwise negative ROS   Physical Exam: BP (P) 123/82   Temp (P) 98.7 F (37.1 C) (Oral)   Resp (P) 18   Ht (P) 5' 2" (1.575 m)   Wt (P) 78 kg   SpO2 (P) 95%   BMI (P) 31.46 kg/m  General:   Alert,  pleasant and cooperative in NAD Head:  Normocephalic and atraumatic. Neck:  Supple; Lungs:  Clear throughout to auscultation.    Heart:  Regular rate and rhythm. Abdomen:  Soft, nontender and nondistended. Normal bowel sounds, without guarding, and without rebound.   Neurologic:  Alert and  oriented x4;  grossly normal neurologically.  Impression/Plan:    DYSPHAGIA/brbpr  Plan: 1. EGD/DIL/TCS TODAY. DISCUSSED PROCEDURE,  BENEFITS, & RISKS: < 1% chance of medication reaction, bleeding, perforation, ASPIRATION, or rupture of spleen/liver requiring surgery to fix it and missed polyps < 1 cm 10-20% of the time.

## 2018-12-10 NOTE — Transfer of Care (Signed)
Immediate Anesthesia Transfer of Care Note  Patient: [name]  Procedure(s) Performed: COLONOSCOPY WITH PROPOFOL (N/A ) ESOPHAGOGASTRODUODENOSCOPY (EGD) WITH PROPOFOL SAVORY DILATION POLYPECTOMY  Patient Location: PACU  Anesthesia Type:MAC  Level of Consciousness: awake and patient cooperative  Airway & Oxygen Therapy: Patient Spontanous Breathing  Post-op Assessment: Report given to RN, Post -op Vital signs reviewed and stable and Patient moving all extremities  Post vital signs: Reviewed and stable  Last Vitals:  Vitals Value Taken Time  BP    Temp    Pulse    Resp    SpO2      Last Pain:  Vitals:   12/10/18 0838  TempSrc: (P) Oral  PainSc: (P) 0-No pain      Patients Stated Pain Goal: (P) 7 (21/19/41 7408)  Complications: No apparent anesthesia complications

## 2018-12-10 NOTE — Anesthesia Preprocedure Evaluation (Signed)
Anesthesia Evaluation    Airway Mallampati: II       Dental  (+) Partial Upper, Partial Lower   Pulmonary former smoker,    breath sounds clear to auscultation       Cardiovascular  Rhythm:regular     Neuro/Psych PSYCHIATRIC DISORDERS Depression    GI/Hepatic GERD  ,  Endo/Other  diabetes, Type 2  Renal/GU      Musculoskeletal   Abdominal   Peds  Hematology   Anesthesia Other Findings Dx DM2 one month ago Cervical disc ds with radicular sx  Reproductive/Obstetrics                             Anesthesia Physical Anesthesia Plan  ASA: III  Anesthesia Plan: MAC   Post-op Pain Management:    Induction:   PONV Risk Score and Plan:   Airway Management Planned:   Additional Equipment:   Intra-op Plan:   Post-operative Plan:   Informed Consent: I have reviewed the patients History and Physical, chart, labs and discussed the procedure including the risks, benefits and alternatives for the proposed anesthesia with the patient or authorized representative who has indicated his/her understanding and acceptance.       Plan Discussed with: Anesthesiologist  Anesthesia Plan Comments:         Anesthesia Quick Evaluation

## 2018-12-13 NOTE — Progress Notes (Signed)
Forwarding to [place] to schedule follow up appt.

## 2018-12-13 NOTE — Progress Notes (Signed)
CC'D TO PCP °

## 2018-12-13 NOTE — Progress Notes (Signed)
Tried to call. Vm not set up. Mailing a letter to call.  

## 2018-12-13 NOTE — Progress Notes (Signed)
PATIENT SCHEDULED AN ON RECALL

## 2018-12-16 ENCOUNTER — Encounter (HOSPITAL_COMMUNITY): Payer: Self-pay | Admitting: Gastroenterology

## 2018-12-16 NOTE — Progress Notes (Signed)
PATIENT SCHEDULED AND ON RECALL  °

## 2018-12-27 ENCOUNTER — Other Ambulatory Visit: Payer: Self-pay

## 2018-12-27 DIAGNOSIS — Z1159 Encounter for screening for other viral diseases: Secondary | ICD-10-CM

## 2019-01-07 ENCOUNTER — Telehealth: Payer: Self-pay | Admitting: *Deleted

## 2019-01-07 NOTE — Telephone Encounter (Signed)
Unable to leave a message, no voicemail.  

## 2019-01-08 NOTE — Telephone Encounter (Signed)
Open n error °

## 2019-01-14 ENCOUNTER — Telehealth: Payer: Self-pay | Admitting: *Deleted

## 2019-01-14 NOTE — Telephone Encounter (Signed)
Unable to reach, no voicemail.

## 2019-01-27 ENCOUNTER — Encounter: Payer: Self-pay | Admitting: Family Medicine

## 2019-01-27 ENCOUNTER — Other Ambulatory Visit: Payer: PRIVATE HEALTH INSURANCE

## 2019-01-27 ENCOUNTER — Ambulatory Visit (INDEPENDENT_AMBULATORY_CARE_PROVIDER_SITE_OTHER): Payer: PRIVATE HEALTH INSURANCE | Admitting: Family Medicine

## 2019-01-27 ENCOUNTER — Other Ambulatory Visit: Payer: Self-pay

## 2019-01-27 DIAGNOSIS — Z20822 Contact with and (suspected) exposure to covid-19: Secondary | ICD-10-CM

## 2019-01-27 DIAGNOSIS — Z20828 Contact with and (suspected) exposure to other viral communicable diseases: Secondary | ICD-10-CM

## 2019-01-27 NOTE — Progress Notes (Signed)
Virtual Visit via telephone Note Due to COVID-19, visit is conducted virtually and was requested by patient. This visit type was conducted due to national recommendations for restrictions regarding the COVID-19 Pandemic (e.g. social distancing) in an effort to limit this patient's exposure and mitigate transmission in our community. All issues noted in this document were discussed and addressed.  A physical exam was not performed with this format.   I connected with [name] on 01/27/19 at 1055 by telephone and verified that I am speaking with the correct person using two identifiers. [name] is currently located at home and no one is currently with them during visit. The provider, Monia Pouch, FNP is located in their office at time of visit.  I discussed the limitations, risks, security and privacy concerns of performing an evaluation and management service by telephone and the availability of in person appointments. I also discussed with the patient that there may be a patient responsible charge related to this service. The patient expressed understanding and agreed to proceed.  Subjective:  Patient ID: [name], female    DOB: [birth year], 58 y.o.   MRN: [id number]  Chief Complaint:  Exposed to COVID-19 and Headache   HPI: [name] is a 58 y.o. female presenting on 01/27/2019 for Exposed to COVID-19 and Headache   Pt reports she was exposed to a store clerk who tested positive for COVID-19. Pt states she has a slight frontal headache but feels this is normal for her. She denies any other symptoms but feels she needs to be tested due to her medical history.     Relevant past medical, surgical, family, and social history reviewed and updated as indicated.  Allergies and medications reviewed and updated.   Past Medical History:  Diagnosis Date   DDD (degenerative disc disease)    spinal injections   Diabetes mellitus without complication ([place])      Diverticular disease    GERD (gastroesophageal reflux disease)    Horseshoe kidney    Hypercholesterolemia    Irritable bowel syndrome     Past Surgical History:  Procedure Laterality Date   cervical dysplasia     COLONOSCOPY  07/01/2009   SNK:[id number] seen in the ascending and sigmoid colon/small internal hemorrhoids/6-mm sessile polyp/4-mm sessile polyp.3-mm sessile polyp removed. simple adenomas   COLONOSCOPY N/A 03/25/2013   Procedure: COLONOSCOPY;  Surgeon: Danie Binder, MD;  [facility];  Service: Endoscopy;  Laterality: N/A;  10:15   COLONOSCOPY WITH PROPOFOL N/A 12/10/2018   Procedure: COLONOSCOPY WITH PROPOFOL;  Surgeon: Danie Binder, MD;  [facility];  Service: Endoscopy;  Laterality: N/A;  9:30am   ESOPHAGEAL DILATION N/A 02/04/2015   Procedure: ESOPHAGEAL DILATION;  Surgeon: Danie Binder, MD;  [facility];  Service: Endoscopy;  Laterality: N/A;   ESOPHAGOGASTRODUODENOSCOPY  07/01/2009   XTK:WIOXBD esophagus/dilation to 16 mm possible proximal cervical web   ESOPHAGOGASTRODUODENOSCOPY N/A 02/04/2015   Procedure: ESOPHAGOGASTRODUODENOSCOPY (EGD);  Surgeon: Danie Binder, MD;  [facility];  Service: Endoscopy;  Laterality: N/A;  0800-moved to 845 Office to notify pt   ESOPHAGOGASTRODUODENOSCOPY (EGD) WITH ESOPHAGEAL DILATION N/A 03/25/2013   Procedure: ESOPHAGOGASTRODUODENOSCOPY (EGD) WITH ESOPHAGEAL DILATION;  Surgeon: Danie Binder, MD;  [facility];  Service: Endoscopy;  Laterality: N/A;   ESOPHAGOGASTRODUODENOSCOPY (EGD) WITH PROPOFOL  12/10/2018   Procedure: ESOPHAGOGASTRODUODENOSCOPY (EGD) WITH PROPOFOL;  Surgeon: Danie Binder, MD;  [facility];  Service:  Endoscopy;;   POLYPECTOMY  12/10/2018   Procedure: POLYPECTOMY;  Surgeon: Danie Binder, MD;  [facility];  Service: Endoscopy;;  cold snare sigmoid polyp   SAVORY DILATION  12/10/2018   Procedure: SAVORY DILATION;   Surgeon: Danie Binder, MD;  [facility];  Service: Endoscopy;;   TONSILLECTOMY     TUBAL LIGATION     WRIST SURGERY      Social History   Socioeconomic History   Marital status: Legally Separated    Spouse name: Not on file   Number of children: Not on file   Years of education: Not on file   Highest education level: Not on file  Occupational History   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: [employer]    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  Social Designer, fashion/clothing strain: Not on file   Food insecurity    Worry: Not on file    Inability: Not on file   Transportation needs    Medical: Not on file    Non-medical: Not on file  Tobacco Use   Smoking status: Former Smoker    Packs/day: 1.00    Years: 25.00    Pack years: 25.00    Quit date: 07/10/1997    Years since quitting: 21.5   Smokeless tobacco: Never Used  Substance and Sexual Activity   Alcohol use: No   Drug use: No   Sexual activity: Not Currently  Lifestyle   Physical activity    Days per week: Not on file    Minutes per session: Not on file   Stress: Not on file  Relationships   Social connections    Talks on phone: Not on file    Gets together: Not on file    Attends religious service: Not on file    Active member of club or organization: Not on file    Attends meetings of clubs or organizations: Not on file    Relationship status: Not on file   Intimate partner violence    Fear of current or ex partner: Not on file    Emotionally abused: Not on file    Physically abused: Not on file    Forced sexual activity: Not on file  Other Topics Concern   Not on file  Social History Narrative   Lives alone.  CNA at [employer].      Outpatient Encounter Medications as of 01/27/2019  Medication Sig   aspirin EC 81 MG tablet Take 1 tablet (81 mg total) by mouth daily.   blood glucose meter kit and supplies Dispense based on patient and insurance preference. Use up to  four times daily as directed. (FOR ICD-10 E10.9, E11.9).   DULoxetine (CYMBALTA) 30 MG capsule Take 1 capsule (30 mg total) by mouth daily for 30 days.   furosemide (LASIX) 20 MG tablet Take 1 tablet (20 mg total) by mouth daily.   metFORMIN (GLUCOPHAGE) 500 MG tablet Take 1 tablet (500 mg total) by mouth daily with breakfast.   metoprolol tartrate (LOPRESSOR) 25 MG tablet Take 1 tablet (25 mg total) by mouth 2 (two) times daily.   potassium chloride SA (K-DUR,KLOR-CON) 20 MEQ tablet Take 1 tablet (20 mEq total) by mouth daily.   simethicone (MYLICON) 354 MG chewable tablet Chew 125 mg by mouth every 6 (six) hours as needed for flatulence.   No facility-administered encounter medications on file as of 01/27/2019.     Allergies  Allergen  Reactions   Penicillins Anaphylaxis and Itching    Has patient had a PCN reaction causing immediate rash, facial/tongue/throat swelling, SOB or lightheadedness with hypotension: Yes Has patient had a PCN reaction causing severe rash involving mucus membranes or skin necrosis: No Has patient had a PCN reaction that required hospitalization Yes Has patient had a PCN reaction occurring within the last 10 years: Yes If all of the above answers are "NO", then may proceed with Cephalosporin use.    Codeine Nausea And Vomiting    Review of Systems  Constitutional: Negative for chills, fatigue and fever.  HENT: Negative for congestion.   Respiratory: Negative for cough and shortness of breath.   Cardiovascular: Negative for chest pain and palpitations.  Gastrointestinal: Negative for abdominal pain, diarrhea, nausea and vomiting.  Musculoskeletal: Negative for arthralgias and myalgias.  Neurological: Positive for headaches. Negative for dizziness, weakness and light-headedness.  Psychiatric/Behavioral: Negative for confusion.  All other systems reviewed and are negative.        Observations/Objective: No vital signs or physical exam, this was a  telephone or virtual health encounter.  Pt alert and oriented, answers all questions appropriately, and able to speak in full sentences.    Assessment and Plan: Hendrix was seen today for exposed to covid-19 and headache.  Diagnoses and all orders for this visit:  Exposure to Covid-19 Virus Pt was exposed to a store clerk who tested positive for COVID-19. Pt does have a slight headache but no other symptoms. Pt would like to be tested. Testing ordered. Discussed quarantine guidelines, symptomatic care, infection prevention, and symptoms that warrant evaluation and treatment.  -     Novel Coronavirus, NAA ([facility]) -     MyChart COVID-19 home monitoring program; Future -     Temperature monitoring; Future      Follow Up Instructions: Return in about 4 weeks (around 02/24/2019), or if symptoms worsen or fail to improve, for DM.    I discussed the assessment and treatment plan with the patient. The patient was provided an opportunity to ask questions and all were answered. The patient agreed with the plan and demonstrated an understanding of the instructions.   The patient was advised to call back or seek an in-person evaluation if the symptoms worsen or if the condition fails to improve as anticipated.  The above assessment and management plan was discussed with the patient. The patient verbalized understanding of and has agreed to the management plan. Patient is aware to call the clinic if symptoms persist or worsen. Patient is aware when to return to the clinic for a follow-up visit. Patient educated on when it is appropriate to go to the emergency department.    I provided 15 minutes of non-face-to-face time during this encounter. The call started at 1055. The call ended at 1105. The other time was used for coordination of care.    Monia Pouch, FNP-C [facility] [address] [place], [place] [address] [phone]

## 2019-01-30 LAB — NOVEL CORONAVIRUS, NAA: SARS-CoV-2, NAA: NOT DETECTED

## 2019-02-25 ENCOUNTER — Other Ambulatory Visit: Payer: Self-pay

## 2019-02-25 DIAGNOSIS — Z1159 Encounter for screening for other viral diseases: Secondary | ICD-10-CM
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
## 2019-04-14 ENCOUNTER — Other Ambulatory Visit: Payer: Self-pay

## 2019-04-14 ENCOUNTER — Ambulatory Visit (INDEPENDENT_AMBULATORY_CARE_PROVIDER_SITE_OTHER): Payer: PRIVATE HEALTH INSURANCE | Admitting: Family Medicine

## 2019-04-14 DIAGNOSIS — N12 Tubulo-interstitial nephritis, not specified as acute or chronic: Secondary | ICD-10-CM | POA: Diagnosis not present

## 2019-04-14 MED ORDER — CIPROFLOXACIN HCL 500 MG PO TABS: 500.0000 mg | ORAL_TABLET | Freq: Two times a day (BID) | ORAL | 0 refills | Status: AC

## 2019-04-14 NOTE — Progress Notes (Signed)
Telephone visit  Subjective: CC: UTI PCP: Baruch Gouty, FNP GNO:[id number] [name] is a 58 y.o. female calls for telephone consult today. Patient provides verbal consent for consult held via phone.  Location of patient: home Location of provider: Working remotely from home Others present for call: none  1. Urinary symptoms Patient reports onset of symptoms Thursday.  She reports she was vomiting at work.  Yesterday, she started having left sided flank pain. She has a singular kidney. She reports fatigue.  Denies dysuria, urinary frequency, urgency, hematuria, fevers, chills, abdominal pain, vaginal discharge.  Patient has used nothing for symptoms.  Patient reports a h/o frequent or recurrent UTIs. She is now tolerating PO intake, though it reduced. She is hydrating adequately.  She is constipated.  Blood sugars have been fine.    ROS: Per HPI  Allergies  Allergen Reactions  . Penicillins Anaphylaxis and Itching    Has patient had a PCN reaction causing immediate rash, facial/tongue/throat swelling, SOB or lightheadedness with hypotension: Yes Has patient had a PCN reaction causing severe rash involving mucus membranes or skin necrosis: No Has patient had a PCN reaction that required hospitalization Yes Has patient had a PCN reaction occurring within the last 10 years: Yes If all of the above answers are "NO", then may proceed with Cephalosporin use.   . Codeine Nausea And Vomiting   Past Medical History:  Diagnosis Date  . DDD (degenerative disc disease)    spinal injections  . Diabetes mellitus without complication ([place])   . Diverticular disease   . GERD (gastroesophageal reflux disease)   . Horseshoe kidney   . Hypercholesterolemia   . Irritable bowel syndrome     Current Outpatient Medications:  .  aspirin EC 81 MG tablet, Take 1 tablet (81 mg total) by mouth daily., Disp: , Rfl:  .  blood glucose meter kit and supplies, Dispense based on patient and insurance  preference. Use up to four times daily as directed. (FOR ICD-10 E10.9, E11.9)., Disp: 1 each, Rfl: 0 .  DULoxetine (CYMBALTA) 30 MG capsule, Take 1 capsule (30 mg total) by mouth daily for 30 days., Disp: 30 capsule, Rfl: 3 .  furosemide (LASIX) 20 MG tablet, Take 1 tablet (20 mg total) by mouth daily., Disp: 30 tablet, Rfl: 3 .  metFORMIN (GLUCOPHAGE) 500 MG tablet, Take 1 tablet (500 mg total) by mouth daily with breakfast., Disp: 180 tablet, Rfl: 3 .  metoprolol tartrate (LOPRESSOR) 25 MG tablet, Take 1 tablet (25 mg total) by mouth 2 (two) times daily., Disp: 60 tablet, Rfl: 11 .  potassium chloride SA (K-DUR,KLOR-CON) 20 MEQ tablet, Take 1 tablet (20 mEq total) by mouth daily., Disp: 30 tablet, Rfl: 3 .  simethicone (MYLICON) 916 MG chewable tablet, Chew 125 mg by mouth every 6 (six) hours as needed for flatulence., Disp: , Rfl:   Assessment/ Plan: 58 y.o. female   1. Pyelonephritis Given reports of nausea with vomiting and low-grade fevers at home will empirically treat for presumed pyelonephritis.  I reviewed her renal function panel which showed normal filtration rate in May.  She has anaphylaxis to penicillin so cephalosporin class was not chosen and instead we will proceed with fluoroquinolone.  Because the resistance is greater than 10% in this area, I strongly advised her that if symptoms were not improving within the next 24 to 48 hours that she be evaluated with a urine sample and possibly imaging.  She voiced good understanding.  She will hydrate adequately and monitor  for worsening signs or symptoms. - ciprofloxacin (CIPRO) 500 MG tablet; Take 1 tablet (500 mg total) by mouth 2 (two) times daily for 7 days.  Dispense: 14 tablet; Refill: 0   Start time: 8:18am End time: 8:26a  Total time spent on patient care (including telephone call/ virtual visit): 15 minutes  [place], [place] [phone]

## 2019-04-14 NOTE — Patient Instructions (Signed)
Pyelonephritis, Adult ° °Pyelonephritis is an infection that occurs in the kidney. The kidneys are organs that help clean the blood by moving waste out of the blood and into the pee (urine). This infection can happen quickly, or it can last for a long time. In most cases, it clears up with treatment and does not cause other problems. °What are the causes? °This condition may be caused by: °· Germs (bacteria) going from the bladder up to the kidney. This may happen after having a bladder infection. °· Germs going from the blood to the kidney. °What increases the risk? °This condition is more likely to develop in: °· Pregnant women. °· Older people. °· People who have any of these conditions: °? Diabetes. °? Inflammation of the prostate gland (prostatitis), in males. °? Kidney stones or bladder stones. °? Other problems with the kidney or the parts of your body that carry pee from the kidneys to the bladder (ureters). °? Cancer. °· People who have a small, thin tube (catheter) placed in the bladder. °· People who are sexually active. °· Women who use a medicine that kills sperm (spermicide) to prevent pregnancy. °· People who have had a prior urinary tract infection (UTI). °What are the signs or symptoms? °Symptoms of this condition include: °· Peeing often. °· A strong urge to pee right away. °· Burning or stinging when peeing. °· Belly pain. °· Back pain. °· Pain in the side (flank area). °· Fever or chills. °· Blood in the pee, or dark pee. °· Feeling sick to your stomach (nauseous) or throwing up (vomiting). °How is this treated? °This condition may be treated by: °· Taking antibiotic medicines by mouth (orally). °· Drinking enough fluids. °If the infection is bad, you may need to stay in the hospital. You may be given antibiotics and fluids that are put directly into a vein through an IV tube. °In some cases, other treatments may be needed. °Follow these instructions at home: °Medicines °· Take your antibiotic  medicine as told by your doctor. Do not stop taking the antibiotic even if you start to feel better. °· Take over-the-counter and prescription medicines only as told by your doctor. °General instructions ° °· Drink enough fluid to keep your pee pale yellow. °· Avoid caffeine, tea, and carbonated drinks. °· Pee (urinate) often. Avoid holding in pee for long periods of time. °· Pee before and after sex. °· After pooping (having a bowel movement), women should wipe from front to back. Use each tissue only once. °· Keep all follow-up visits as told by your doctor. This is important. °Contact a doctor if: °· You do not feel better after 2 days. °· Your symptoms get worse. °· You have a fever. °Get help right away if: °· You cannot take your medicine or drink fluids as told. °· You have chills and shaking. °· You throw up. °· You have very bad pain in your side or back. °· You feel very weak or you pass out (faint). °Summary °· Pyelonephritis is an infection that occurs in the kidney. °· In most cases, this infection clears up with treatment and does not cause other problems. °· Take your antibiotic medicine as told by your doctor. Do not stop taking the antibiotic even if you start to feel better. °· Drink enough fluid to keep your pee pale yellow. °This information is not intended to replace advice given to you by your health care provider. Make sure you discuss any questions you have with   your health care provider. °Document Released: 08/03/2004 Document Revised: 04/30/2018 Document Reviewed: 04/30/2018 °Elsevier Patient Education © 2020 [employer] Inc[employer] ° °

## 2019-04-15 ENCOUNTER — Encounter: Payer: Self-pay | Admitting: Gastroenterology

## 2019-04-15 NOTE — Progress Notes (Deleted)
      Primary Care Physician: Baruch Gouty, FNP  Primary Gastroenterologist:  Barney Drain, MD   No chief complaint on file.   HPI: [name] is a 58 y.o. female here   EGD/TCS 12/2018: moderate schatzki ring s/p dilation, gastritis, nonbleeding duodenal diverticulum, small hh. 66m colon polyp (serrated), normal TI, bleeding due to hemorrhoids, diverticulosis. Next TCS in 5 years.  Current Outpatient Medications  Medication Sig Dispense Refill  . aspirin EC 81 MG tablet Take 1 tablet (81 mg total) by mouth daily.    . blood glucose meter kit and supplies Dispense based on patient and insurance preference. Use up to four times daily as directed. (FOR ICD-10 E10.9, E11.9). 1 each 0  . ciprofloxacin (CIPRO) 500 MG tablet Take 1 tablet (500 mg total) by mouth 2 (two) times daily for 7 days. 14 tablet 0  . DULoxetine (CYMBALTA) 30 MG capsule Take 1 capsule (30 mg total) by mouth daily for 30 days. 30 capsule 3  . furosemide (LASIX) 20 MG tablet Take 1 tablet (20 mg total) by mouth daily. 30 tablet 3  . metFORMIN (GLUCOPHAGE) 500 MG tablet Take 1 tablet (500 mg total) by mouth daily with breakfast. 180 tablet 3  . metoprolol tartrate (LOPRESSOR) 25 MG tablet Take 1 tablet (25 mg total) by mouth 2 (two) times daily. 60 tablet 11  . potassium chloride SA (K-DUR,KLOR-CON) 20 MEQ tablet Take 1 tablet (20 mEq total) by mouth daily. 30 tablet 3  . simethicone (MYLICON) 1349MG chewable tablet Chew 125 mg by mouth every 6 (six) hours as needed for flatulence.     No current facility-administered medications for this visit.     Allergies as of 04/16/2019 - Review Complete 04/14/2019  Allergen Reaction Noted  . Penicillins Anaphylaxis and Itching 07/24/2011  . Codeine Nausea And Vomiting 09/22/2014    ROS:  General: Negative for anorexia, weight loss, fever, chills, fatigue, weakness. ENT: Negative for hoarseness, difficulty swallowing , nasal congestion. CV: Negative for chest  pain, angina, palpitations, dyspnea on exertion, peripheral edema.  Respiratory: Negative for dyspnea at rest, dyspnea on exertion, cough, sputum, wheezing.  GI: See history of present illness. GU:  Negative for dysuria, hematuria, urinary incontinence, urinary frequency, nocturnal urination.  Endo: Negative for unusual weight change.    Physical Examination:   There were no vitals taken for this visit.  General: Well-nourished, well-developed in no acute distress.  Eyes: No icterus. Mouth: Oropharyngeal mucosa moist and pink , no lesions erythema or exudate. Lungs: Clear to auscultation bilaterally.  Heart: Regular rate and rhythm, no murmurs rubs or gallops.  Abdomen: Bowel sounds are normal, nontender, nondistended, no hepatosplenomegaly or masses, no abdominal bruits or hernia , no rebound or guarding.   Extremities: No lower extremity edema. No clubbing or deformities. Neuro: Alert and oriented x 4   Skin: Warm and dry, no jaundice.   Psych: Alert and cooperative, normal mood and affect.  Labs:  ***  Imaging Studies: No results found.

## 2019-04-16 ENCOUNTER — Ambulatory Visit: Payer: PRIVATE HEALTH INSURANCE | Admitting: Gastroenterology

## 2019-04-16 ENCOUNTER — Encounter: Payer: Self-pay | Admitting: Gastroenterology

## 2019-04-16 ENCOUNTER — Telehealth: Payer: Self-pay | Admitting: Gastroenterology

## 2019-04-16 NOTE — Telephone Encounter (Signed)
PATIENT WAS A NO SHOW AND LETTER SENT  °

## 2019-05-26 ENCOUNTER — Ambulatory Visit (INDEPENDENT_AMBULATORY_CARE_PROVIDER_SITE_OTHER): Payer: PRIVATE HEALTH INSURANCE | Admitting: Family Medicine

## 2019-05-26 ENCOUNTER — Encounter: Payer: Self-pay | Admitting: Family Medicine

## 2019-05-26 DIAGNOSIS — G8929 Other chronic pain: Secondary | ICD-10-CM | POA: Diagnosis not present

## 2019-05-26 DIAGNOSIS — M5442 Lumbago with sciatica, left side: Secondary | ICD-10-CM | POA: Diagnosis not present

## 2019-05-26 DIAGNOSIS — M5441 Lumbago with sciatica, right side: Secondary | ICD-10-CM | POA: Diagnosis not present

## 2019-05-26 MED ORDER — CYCLOBENZAPRINE HCL 5 MG PO TABS: 5.0000 mg | ORAL_TABLET | Freq: Three times a day (TID) | ORAL | 0 refills | Status: AC | PRN

## 2019-05-26 MED ORDER — PREDNISONE 20 MG PO TABS: ORAL_TABLET | ORAL | 0 refills | Status: DC

## 2019-05-26 NOTE — Progress Notes (Signed)
° °Virtual Visit via telephone Note °Due to COVID-19 pandemic this visit was conducted virtually. This visit type was conducted due to national recommendations for restrictions regarding the COVID-19 Pandemic (e.g. social distancing, sheltering in place) in an effort to limit this patient's exposure and mitigate transmission in our community. All issues noted in this document were discussed and addressed.  A physical exam was not performed with this format.  ° °I connected with [name] on 05/26/2019 at 0930 by telephone and verified that I am speaking with the correct person using two identifiers. [name] is currently located at home and no one is currently with them during visit. The provider, Michelle Rakes, FNP is located in their office at time of visit. ° °I discussed the limitations, risks, security and privacy concerns of performing an evaluation and management service by telephone and the availability of in person appointments. I also discussed with the patient that there may be a patient responsible charge related to this service. The patient expressed understanding and agreed to proceed. ° °Subjective:  °Patient ID: [name], female    DOB: 10/01/1960, 58 y.o.   MRN: [id number] ° °Chief Complaint:  Back Pain ° ° °HPI: °[name] is a 58 y.o. female presenting on 05/26/2019 for Back Pain ° ° °Back Pain °This is a recurrent problem. The current episode started 1 to 4 weeks ago. The problem occurs daily. The problem has been waxing and waning since onset. The pain is present in the lumbar spine, gluteal and sacro-iliac. The quality of the pain is described as burning, shooting and aching. The pain radiates to the left thigh and right thigh. The pain is at a severity of 5/10. The pain is moderate. The pain is worse during the day. The symptoms are aggravated by bending, position, standing, twisting and stress. Stiffness is present all day. Associated symptoms include leg  pain. Pertinent negatives include no abdominal pain, bladder incontinence, bowel incontinence, chest pain, dysuria, fever, headaches, numbness, paresis, paresthesias, pelvic pain, perianal numbness, tingling, weakness or weight loss. Risk factors include lack of exercise, poor posture and sedentary lifestyle. She has tried analgesics, heat and NSAIDs for the symptoms. The treatment provided no relief.  ° ° ° °Relevant past medical, surgical, family, and social history reviewed and updated as indicated.  °Allergies and medications reviewed and updated. ° ° °Past Medical History:  °Diagnosis Date  °• DDD (degenerative disc disease)   ° spinal injections  °• Diabetes mellitus without complication (HCC)   °• Diverticular disease   °• GERD (gastroesophageal reflux disease)   °• Horseshoe kidney   °• Hypercholesterolemia   °• Irritable bowel syndrome   ° ° °Past Surgical History:  °Procedure Laterality Date  °• cervical dysplasia    °• COLONOSCOPY  07/01/2009  ° [facility]:diverticula seen in the ascending and sigmoid colon/small internal hemorrhoids/6-mm sessile polyp/4-mm sessile polyp.3-mm sessile polyp removed. simple adenomas  °• COLONOSCOPY N/A 03/25/2013  ° Procedure: COLONOSCOPY;  Surgeon: Sandi L Fields, MD;  [facility];  Service: Endoscopy;  Laterality: N/A;  10:15  °• COLONOSCOPY WITH PROPOFOL N/A 12/10/2018  ° Dr. Fields: 4mm serrated colon polyp, normal TI, hemorrhoids, diverticulosis. next TCS in five years.   °• ESOPHAGEAL DILATION N/A 02/04/2015  ° Procedure: ESOPHAGEAL DILATION;  Surgeon: Sandi L Fields, MD;  [facility];  Service: Endoscopy;  Laterality: N/A;  °• ESOPHAGOGASTRODUODENOSCOPY  07/01/2009  ° [facility]:normal esophagus/dilation to 16 mm possible proximal cervical web  °•   ESOPHAGOGASTRODUODENOSCOPY N/A 02/04/2015   Procedure: ESOPHAGOGASTRODUODENOSCOPY (EGD);  Surgeon: Danie Binder, MD;  [facility];  Service: Endoscopy;  Laterality: N/A;  0800-moved to 845 Office to  notify pt   ESOPHAGOGASTRODUODENOSCOPY (EGD) WITH ESOPHAGEAL DILATION N/A 03/25/2013   Procedure: ESOPHAGOGASTRODUODENOSCOPY (EGD) WITH ESOPHAGEAL DILATION;  Surgeon: Danie Binder, MD;  [facility];  Service: Endoscopy;  Laterality: N/A;   ESOPHAGOGASTRODUODENOSCOPY (EGD) WITH PROPOFOL  12/10/2018   Dr. Oneida Alar: moderate Schatzki ring s/p dilation, gastritis, nonbleeding duodenal diverticulum   POLYPECTOMY  12/10/2018   Procedure: POLYPECTOMY;  Surgeon: Danie Binder, MD;  [facility];  Service: Endoscopy;;  cold snare sigmoid polyp   SAVORY DILATION  12/10/2018   Procedure: SAVORY DILATION;  Surgeon: Danie Binder, MD;  [facility];  Service: Endoscopy;;   TONSILLECTOMY     TUBAL LIGATION     WRIST SURGERY      Social History   Socioeconomic History   Marital status: Legally Separated    Spouse name: Not on file   Number of children: Not on file   Years of education: Not on file   Highest education level: Not on file  Occupational History   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: [employer]    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  Social Designer, fashion/clothing strain: Not on file   Food insecurity    Worry: Not on file    Inability: Not on file   Transportation needs    Medical: Not on file    Non-medical: Not on file  Tobacco Use   Smoking status: Former Smoker    Packs/day: 1.00    Years: 25.00    Pack years: 25.00    Quit date: 07/10/1997    Years since quitting: 21.8   Smokeless tobacco: Never Used  Substance and Sexual Activity   Alcohol use: No   Drug use: No   Sexual activity: Not Currently  Lifestyle   Physical activity    Days per week: Not on file    Minutes per session: Not on file   Stress: Not on file  Relationships   Social connections    Talks on phone: Not on file    Gets together: Not on file    Attends religious service: Not on file    Active member of club or organization: Not on file     Attends meetings of clubs or organizations: Not on file    Relationship status: Not on file   Intimate partner violence    Fear of current or ex partner: Not on file    Emotionally abused: Not on file    Physically abused: Not on file    Forced sexual activity: Not on file  Other Topics Concern   Not on file  Social History Narrative   Lives alone.  CNA at [employer].      Outpatient Encounter Medications as of 05/26/2019  Medication Sig   aspirin EC 81 MG tablet Take 1 tablet (81 mg total) by mouth daily.   blood glucose meter kit and supplies Dispense based on patient and insurance preference. Use up to four times daily as directed. (FOR ICD-10 E10.9, E11.9).   cyclobenzaprine (FLEXERIL) 5 MG tablet Take 1 tablet (5 mg total) by mouth 3 (three) times daily as needed for up to 10 days for muscle spasms.   DULoxetine (CYMBALTA) 30 MG capsule Take 1 capsule (30 mg total) by  mouth daily for 30 days.  °• furosemide (LASIX) 20 MG tablet Take 1 tablet (20 mg total) by mouth daily.  °• metFORMIN (GLUCOPHAGE) 500 MG tablet Take 1 tablet (500 mg total) by mouth daily with breakfast.  °• metoprolol tartrate (LOPRESSOR) 25 MG tablet Take 1 tablet (25 mg total) by mouth 2 (two) times daily.  °• polyethylene glycol-electrolytes (NULYTELY/GOLYTELY) 420 g solution TAKE 4000 MLS BY MOUTH ONCE FOR 1 DOSE  °• potassium chloride SA (K-DUR,KLOR-CON) 20 MEQ tablet Take 1 tablet (20 mEq total) by mouth daily.  °• predniSONE (DELTASONE) 20 MG tablet 2 po at sametime daily for 5 days  °• simethicone (MYLICON) 125 MG chewable tablet Chew 125 mg by mouth every 6 (six) hours as needed for flatulence.  ° °No facility-administered encounter medications on file as of 05/26/2019.   ° ° °Allergies  °Allergen Reactions  °• Penicillins Anaphylaxis and Itching  °  Has patient had a PCN reaction causing immediate rash, facial/tongue/throat swelling, SOB or lightheadedness with hypotension: Yes °Has patient had a PCN reaction  causing severe rash involving mucus membranes or skin necrosis: No °Has patient had a PCN reaction that required hospitalization Yes °Has patient had a PCN reaction occurring within the last 10 years: Yes °If all of the above answers are "NO", then may proceed with Cephalosporin use. °  °• Codeine Nausea And Vomiting  ° ° °Review of Systems  °Constitutional: Negative for activity change, appetite change, chills, diaphoresis, fatigue, fever, unexpected weight change and weight loss.  °HENT: Negative.   °Eyes: Negative.   °Respiratory: Negative for cough, chest tightness and shortness of breath.   °Cardiovascular: Negative for chest pain, palpitations and leg swelling.  °Gastrointestinal: Negative for abdominal pain, blood in stool, bowel incontinence, constipation, diarrhea, nausea and vomiting.  °Endocrine: Negative.   °Genitourinary: Negative for bladder incontinence, decreased urine volume, difficulty urinating, dysuria, flank pain, frequency, pelvic pain and urgency.  °Musculoskeletal: Positive for back pain and myalgias. Negative for joint swelling, neck pain and neck stiffness.  °Skin: Negative.  Negative for rash.  °Allergic/Immunologic: Negative.   °Neurological: Negative for dizziness, tingling, tremors, seizures, syncope, facial asymmetry, speech difficulty, weakness, light-headedness, numbness, headaches and paresthesias.  °Hematological: Negative.   °Psychiatric/Behavioral: Negative for confusion, hallucinations, sleep disturbance and suicidal ideas.  °All other systems reviewed and are negative. ° ° °   ° ° °Observations/Objective: °No vital signs or physical exam, this was a telephone or virtual health encounter.  °Pt alert and oriented, answers all questions appropriately, and able to speak in full sentences.  ° ° °Assessment and Plan: °Halyn was seen today for back pain. ° °Diagnoses and all orders for this visit: ° °Chronic bilateral low back pain with bilateral sciatica °Worsening chronic back  pain with bilateral sciatica. No red flags concerning for cauda equina syndrome. Symptomatic care discussed in detail. Pt reports good control of blood sugars. Will give burst of steroids. Pt aware to monitor blood sugars more frequently while taking prednisone. Flexeril three times daily as needed. Report any new or worsening symptoms.  °-     predniSONE (DELTASONE) 20 MG tablet; 2 po at sametime daily for 5 days °-     cyclobenzaprine (FLEXERIL) 5 MG tablet; Take 1 tablet (5 mg total) by mouth 3 (three) times daily as needed for up to 10 days for muscle spasms. ° ° ° ° °Follow Up Instructions: °Return in about 6 weeks (around 07/07/2019), or if symptoms worsen or fail to improve, for backpain. ° °  °  I discussed the assessment and treatment plan with the patient. The patient was provided an opportunity to ask questions and all were answered. The patient agreed with the plan and demonstrated an understanding of the instructions.   The patient was advised to call back or seek an in-person evaluation if the symptoms worsen or if the condition fails to improve as anticipated.  The above assessment and management plan was discussed with the patient. The patient verbalized understanding of and has agreed to the management plan. Patient is aware to call the clinic if they develop any new symptoms or if symptoms persist or worsen. Patient is aware when to return to the clinic for a follow-up visit. Patient educated on when it is appropriate to go to the emergency department.    I provided 15 minutes of non-face-to-face time during this encounter. The call started at 0930. The call ended at 0945. The other time was used for coordination of care.    Monia Pouch, FNP-C [facility] [address] [place], [place] [address] [phone] 05/26/2019

## 2019-06-21 ENCOUNTER — Other Ambulatory Visit: Payer: Self-pay | Admitting: Unknown Physician Specialty

## 2019-06-21 ENCOUNTER — Telehealth: Payer: Self-pay | Admitting: Unknown Physician Specialty

## 2019-06-21 DIAGNOSIS — U071 COVID-19: Secondary | ICD-10-CM

## 2019-06-21 NOTE — Telephone Encounter (Signed)
  I connected by phone with [name] on 06/21/2019 at 6:06 PM to discuss the potential use of an new treatment for mild to moderate COVID-19 viral infection in non-hospitalized patients.  This patient is a 58 y.o. female that meets the FDA criteria for Emergency Use Authorization of bamlanivimab or casirivimab\imdevimab.  Has a (+) direct SARS-CoV-2 viral test result  Has mild or moderate COVID-19   Is ? 58 years of age and weighs ? 40 kg  Is NOT hospitalized due to COVID-19  Is NOT requiring oxygen therapy or requiring an increase in baseline oxygen flow rate due to COVID-19  Is within 10 days of symptom onset  Has at least one of the high risk factor(s) for progression to severe COVID-19 and/or hospitalization as defined in EUA.  Specific high risk criteria : Diabetes   I have spoken and communicated the following to the patient or parent/caregiver:  1. FDA has authorized the emergency use of bamlanivimab and casirivimab\imdevimab for the treatment of mild to moderate COVID-19 in adults and pediatric patients with positive results of direct SARS-CoV-2 viral testing who are 70 years of age and older weighing at least 40 kg, and who are at high risk for progressing to severe COVID-19 and/or hospitalization.  2. The significant known and potential risks and benefits of bamlanivimab and casirivimab\imdevimab, and the extent to which such potential risks and benefits are unknown.  3. Information on available alternative treatments and the risks and benefits of those alternatives, including clinical trials.  4. Patients treated with bamlanivimab and casirivimab\imdevimab should continue to self-isolate and use infection control measures (e.g., wear mask, isolate, social distance, avoid sharing personal items, clean and disinfect "high touch" surfaces, and frequent handwashing) according to CDC guidelines.   5. The patient or parent/caregiver has the option to accept or refuse  bamlanivimab or casirivimab\imdevimab .  After reviewing this information with the patient, The patient agreed to proceed with receiving the casirivimab\imdevimab infusion and will be provided a copy of the Fact sheet prior to receiving the infusion. [name] 06/21/2019 6:06 PM

## 2019-06-23 ENCOUNTER — Ambulatory Visit (HOSPITAL_COMMUNITY)
Admission: RE | Admit: 2019-06-23 | Discharge: 2019-06-23 | Disposition: A | Discharge status: Home / Self Care | Payer: PRIVATE HEALTH INSURANCE | Source: Ambulatory Visit | Attending: Critical Care Medicine | Admitting: Critical Care Medicine

## 2019-06-23 DIAGNOSIS — U071 COVID-19: Secondary | ICD-10-CM | POA: Insufficient documentation

## 2019-06-23 MED ORDER — ONDANSETRON HCL 4 MG/2ML IJ SOLN
INTRAMUSCULAR | Status: AC
  Administered 2019-06-23: 4 mg via INTRAVENOUS
  Filled 2019-06-23: qty 2

## 2019-06-23 MED ORDER — SODIUM CHLORIDE 0.9 % IV SOLN
Freq: Once | INTRAVENOUS | Status: AC
  Administered 2019-06-23: 15:00:00 via INTRAVENOUS
  Filled 2019-06-23: qty 10

## 2019-06-23 MED ORDER — METHYLPREDNISOLONE SODIUM SUCC 125 MG IJ SOLR: 125.0000 mg | Freq: Once | INTRAMUSCULAR | Status: DC | PRN

## 2019-06-23 MED ORDER — FAMOTIDINE IN NACL 20-0.9 MG/50ML-% IV SOLN: 20.0000 mg | Freq: Once | INTRAVENOUS | Status: DC | PRN

## 2019-06-23 MED ORDER — DIPHENHYDRAMINE HCL 50 MG/ML IJ SOLN: 50.0000 mg | Freq: Once | INTRAMUSCULAR | Status: DC | PRN

## 2019-06-23 MED ORDER — ALBUTEROL SULFATE HFA 108 (90 BASE) MCG/ACT IN AERS: 2.0000 | INHALATION_SPRAY | Freq: Once | RESPIRATORY_TRACT | Status: DC | PRN

## 2019-06-23 MED ORDER — ONDANSETRON HCL 4 MG/2ML IJ SOLN: 4.0000 mg | Freq: Once | INTRAMUSCULAR | Status: AC

## 2019-06-23 MED ORDER — SODIUM CHLORIDE 0.9 % IV SOLN
INTRAVENOUS | Status: DC | PRN
  Administered 2019-06-23: 250 mL via INTRAVENOUS

## 2019-06-23 MED ORDER — EPINEPHRINE 0.3 MG/0.3ML IJ SOAJ: 0.3000 mg | Freq: Once | INTRAMUSCULAR | Status: DC | PRN

## 2019-06-23 NOTE — Progress Notes (Signed)
Patient ID: [name], female   DOB: 1960/11/02, 58 y.o.   MRN: [id number]     Diagnosis: [id number]   Procedure: casirivimab/imdevimab infusion  Complications: none Discharge: Discharged home   Estrella Deeds 06/23/2019

## 2019-07-17 ENCOUNTER — Telehealth: Payer: Self-pay | Admitting: Family Medicine

## 2019-07-17 NOTE — Telephone Encounter (Signed)
Pt scheduled for televisit 1/8 at [facility].

## 2019-07-17 NOTE — Telephone Encounter (Signed)
Have the COVID symptoms resolved? We can order a follow up chest xray but she can not come into the office to have it completed if she is symptomatic.

## 2019-07-17 NOTE — Telephone Encounter (Signed)
Pt says she recently had COVID (tested on 06/16/19 and quarantined until 06/28/19) and is still having some chest pain and shortness of breath. Wants the doctor to order a chest xray for her. Pt says she took the COVID vaccine on 07/14/2019. Says the next morning, she started having COVID symptoms again.

## 2019-07-17 NOTE — Telephone Encounter (Signed)
She is still symptomatic. She received the vaccine on 07/14/19 and now having symptoms again.

## 2019-07-17 NOTE — Telephone Encounter (Signed)
If symptoms are severe, she can be seen in the ED or UC

## 2019-07-17 NOTE — Telephone Encounter (Signed)
Pt called back saying that she missed a call from [place]. Pt does not want to go to the ER or UC to have chest xray. Pt wants referral to have it done. Doesn't want to do it through the ER.

## 2019-07-18 ENCOUNTER — Ambulatory Visit (INDEPENDENT_AMBULATORY_CARE_PROVIDER_SITE_OTHER): Payer: 59 | Admitting: Family Medicine

## 2019-07-18 ENCOUNTER — Encounter: Payer: Self-pay | Admitting: Family Medicine

## 2019-07-18 DIAGNOSIS — R071 Chest pain on breathing: Secondary | ICD-10-CM | POA: Diagnosis not present

## 2019-07-18 DIAGNOSIS — R0602 Shortness of breath: Secondary | ICD-10-CM

## 2019-07-18 DIAGNOSIS — R05 Cough: Secondary | ICD-10-CM | POA: Diagnosis not present

## 2019-07-18 DIAGNOSIS — R059 Cough, unspecified: Secondary | ICD-10-CM

## 2019-07-18 NOTE — Progress Notes (Signed)
Virtual Visit via Telephone Note  I connected with [name] on 07/18/19 at 8:06 AM by telephone and verified that I am speaking with the correct person using two identifiers. [name] is currently located at home and nobody is currently with her during this visit. The provider, Loman Brooklyn, FNP is located in their home at time of visit.  I discussed the limitations, risks, security and privacy concerns of performing an evaluation and management service by telephone and the availability of in person appointments. I also discussed with the patient that there may be a patient responsible charge related to this service. The patient expressed understanding and agreed to proceed.  Subjective: PCP: Baruch Gouty, FNP  Chief Complaint  Patient presents with  . URI   Patient complains of cough, shortness of breath and chest pain . She is drinking moderate amounts of fluids. Evaluation to date: patient had COVID-19 last month and did receive the infusions with [facility]. She does not have a history of asthma or COPD but she did have COVID-19. She does not smoke. She reports she did receive the COVID-19 vaccine on Monday, January 4th (4 days ago) which was only 24 days after the onset of COVID-19. She also reports within 24 hours of the vaccine she started having symptoms similar to when she was diagnosed with COVID-19 such as body aches, headaches, nausea, vomiting, and fatigue which resolved after another 24 hours.    ROS: Per HPI  Current Outpatient Medications:  .  aspirin EC 81 MG tablet, Take 1 tablet (81 mg total) by mouth daily., Disp: , Rfl:  .  blood glucose meter kit and supplies, Dispense based on patient and insurance preference. Use up to four times daily as directed. (FOR ICD-10 E10.9, E11.9)., Disp: 1 each, Rfl: 0 .  DULoxetine (CYMBALTA) 30 MG capsule, Take 1 capsule (30 mg total) by mouth daily for 30 days., Disp: 30 capsule, Rfl: 3 .  furosemide (LASIX) 20  MG tablet, Take 1 tablet (20 mg total) by mouth daily., Disp: 30 tablet, Rfl: 3 .  metFORMIN (GLUCOPHAGE) 500 MG tablet, Take 1 tablet (500 mg total) by mouth daily with breakfast., Disp: 180 tablet, Rfl: 3 .  metoprolol tartrate (LOPRESSOR) 25 MG tablet, Take 1 tablet (25 mg total) by mouth 2 (two) times daily., Disp: 60 tablet, Rfl: 11 .  polyethylene glycol-electrolytes (NULYTELY/GOLYTELY) 420 g solution, TAKE 4000 MLS BY MOUTH ONCE FOR 1 DOSE, Disp: , Rfl:  .  potassium chloride SA (K-DUR,KLOR-CON) 20 MEQ tablet, Take 1 tablet (20 mEq total) by mouth daily., Disp: 30 tablet, Rfl: 3 .  simethicone (MYLICON) 330 MG chewable tablet, Chew 125 mg by mouth every 6 (six) hours as needed for flatulence., Disp: , Rfl:   Allergies  Allergen Reactions  . Penicillins Anaphylaxis and Itching    Has patient had a PCN reaction causing immediate rash, facial/tongue/throat swelling, SOB or lightheadedness with hypotension: Yes Has patient had a PCN reaction causing severe rash involving mucus membranes or skin necrosis: No Has patient had a PCN reaction that required hospitalization Yes Has patient had a PCN reaction occurring within the last 10 years: Yes If all of the above answers are "NO", then may proceed with Cephalosporin use.   . Codeine Nausea And Vomiting   Past Medical History:  Diagnosis Date  . DDD (degenerative disc disease)    spinal injections  . Diabetes mellitus without complication ([facility])   . Diverticular disease   .  GERD (gastroesophageal reflux disease)   . Horseshoe kidney   . Hypercholesterolemia   . Irritable bowel syndrome     Observations/Objective: A&O  No respiratory distress or wheezing audible over the phone Mood, judgement, and thought processes all WNL  Assessment and Plan: 1. Chest pain on breathing/Shortness of breath/Cough - Advised patient to go to the ER due to symptoms. Discussed that a PE needs to be ruled out due to her symptoms and that COVID-19 can  cause blood clots. Advised that this can cause death and expressed the urgency of this. She is agreeable to this plan.    Follow Up Instructions:  I discussed the assessment and treatment plan with the patient. The patient was provided an opportunity to ask questions and all were answered. The patient agreed with the plan and demonstrated an understanding of the instructions.   The patient was advised to call back or seek an in-person evaluation if the symptoms worsen or if the condition fails to improve as anticipated.  The above assessment and management plan was discussed with the patient. The patient verbalized understanding of and has agreed to the management plan. Patient is aware to call the clinic if symptoms persist or worsen. Patient is aware when to return to the clinic for a follow-up visit. Patient educated on when it is appropriate to go to the emergency department.   Time call ended: 8:29 AM  I provided 25 minutes of non-face-to-face time during this encounter.  Hendricks Limes, MSN, APRN, FNP-C [facility] 07/18/19

## 2019-09-11 LAB — HM DIABETES EYE EXAM

## 2019-12-20 ENCOUNTER — Emergency Department (HOSPITAL_COMMUNITY)
Admission: EM | Admit: 2019-12-20 | Discharge: 2019-12-20 | Disposition: A | Discharge status: Home / Self Care | Payer: 59 | Attending: Emergency Medicine | Admitting: Emergency Medicine

## 2019-12-20 ENCOUNTER — Encounter (HOSPITAL_COMMUNITY): Payer: Self-pay | Admitting: Emergency Medicine

## 2019-12-20 ENCOUNTER — Other Ambulatory Visit: Payer: Self-pay

## 2019-12-20 DIAGNOSIS — Z7982 Long term (current) use of aspirin: Secondary | ICD-10-CM | POA: Insufficient documentation

## 2019-12-20 DIAGNOSIS — E1165 Type 2 diabetes mellitus with hyperglycemia: Secondary | ICD-10-CM | POA: Diagnosis not present

## 2019-12-20 DIAGNOSIS — Z87891 Personal history of nicotine dependence: Secondary | ICD-10-CM | POA: Diagnosis not present

## 2019-12-20 DIAGNOSIS — Y9289 Other specified places as the place of occurrence of the external cause: Secondary | ICD-10-CM | POA: Insufficient documentation

## 2019-12-20 DIAGNOSIS — W290XXA Contact with powered kitchen appliance, initial encounter: Secondary | ICD-10-CM | POA: Insufficient documentation

## 2019-12-20 DIAGNOSIS — Y999 Unspecified external cause status: Secondary | ICD-10-CM | POA: Diagnosis not present

## 2019-12-20 DIAGNOSIS — Z7984 Long term (current) use of oral hypoglycemic drugs: Secondary | ICD-10-CM | POA: Insufficient documentation

## 2019-12-20 DIAGNOSIS — Y93G1 Activity, food preparation and clean up: Secondary | ICD-10-CM | POA: Insufficient documentation

## 2019-12-20 DIAGNOSIS — S61214A Laceration without foreign body of right ring finger without damage to nail, initial encounter: Secondary | ICD-10-CM | POA: Diagnosis present

## 2019-12-20 MED ORDER — TETANUS-DIPHTH-ACELL PERTUSSIS 5-2.5-18.5 LF-MCG/0.5 IM SUSP: 0.5000 mL | Freq: Once | INTRAMUSCULAR | Status: DC

## 2019-12-20 NOTE — Discharge Instructions (Addendum)
Keep the finger clean and dry.  The Dermabond should begin to peel off and a week or so.  Return to the emergency department if you develop pain to your finger, redness, swelling or red streaks, fever or chills.

## 2019-12-20 NOTE — ED Provider Notes (Signed)
[facility] Provider Note   CSN: [id number] Arrival date & time: 12/20/19  1615     History Chief Complaint  Patient presents with  . Laceration    [name] is a 59 y.o. female.  HPI     AMAYRANI BENNICK is a 59 y.o. female who presents to the Emergency Department complaining of laceration to her right ring finger that occurred last evening.  She states that she was washing a food processor blade when the laceration occurred.  She reports bleeding of the wound, she cleaned the wound with tap water, alcohol, and peroxide.  She taped the wound and applied Saran wrap over her finger to control the bleeding.  She comes in this evening for evaluation.  She denies pain to her finger or difficulty with movement.  She denies swelling, numbness or tingling.  She takes 81 mg aspirin daily but denies any blood thinners.  She is unsure of her last tetanus but believes it has been less than 10 years.   Past Medical History:  Diagnosis Date  . DDD (degenerative disc disease)    spinal injections  . Diabetes mellitus without complication ([place])   . Diverticular disease   . GERD (gastroesophageal reflux disease)   . Horseshoe kidney   . Hypercholesterolemia   . Irritable bowel syndrome     Patient Active Problem List   Diagnosis Date Noted  . Chronic bilateral low back pain with bilateral sciatica 05/26/2019  . Leg swelling 10/23/2018  . Nonspecific abnormal electrocardiogram (ECG) (EKG) 10/23/2018  . Type 2 diabetes mellitus with hyperglycemia, without long-term current use of insulin ([place]) 10/17/2018  . DDD (degenerative disc disease), lumbar 09/26/2018  . DDD (degenerative disc disease), cervical 09/26/2018  . Paresthesia of arm 09/26/2018  . Chronic neck and back pain 09/26/2018  . Depression, recurrent ([place]) 09/26/2018  . Hyperglycemia 08/29/2018  . Dysphagia, pharyngoesophageal phase   . Dysphagia 03/07/2013  . GERD, SEVERE 06/29/2009  . Irritable  bowel syndrome 06/29/2009  . Hemorrhoids, internal 06/29/2009    Past Surgical History:  Procedure Laterality Date  . cervical dysplasia    . COLONOSCOPY  07/01/2009   RAX:[id number] seen in the ascending and sigmoid colon/small internal hemorrhoids/6-mm sessile polyp/4-mm sessile polyp.3-mm sessile polyp removed. simple adenomas  . COLONOSCOPY N/A 03/25/2013   Procedure: COLONOSCOPY;  Surgeon: Danie Binder, MD;  Location: [facility];  Service: Endoscopy;  Laterality: N/A;  10:15  . COLONOSCOPY WITH PROPOFOL N/A 12/10/2018   Dr. Oneida Alar: 19m serrated colon polyp, normal TI, hemorrhoids, diverticulosis. next TCS in five years.   . ESOPHAGEAL DILATION N/A 02/04/2015   Procedure: ESOPHAGEAL DILATION;  Surgeon: SDanie Binder MD;  Location: [facility];  Service: Endoscopy;  Laterality: N/A;  . ESOPHAGOGASTRODUODENOSCOPY  07/01/2009   SJSR:PRXYVOesophagus/dilation to 16 mm possible proximal cervical web  . ESOPHAGOGASTRODUODENOSCOPY N/A 02/04/2015   Procedure: ESOPHAGOGASTRODUODENOSCOPY (EGD);  Surgeon: SDanie Binder MD;  [facility];  Service: Endoscopy;  Laterality: N/A;  0800-moved to 845 Office to notify pt  . ESOPHAGOGASTRODUODENOSCOPY (EGD) WITH ESOPHAGEAL DILATION N/A 03/25/2013   Procedure: ESOPHAGOGASTRODUODENOSCOPY (EGD) WITH ESOPHAGEAL DILATION;  Surgeon: SDanie Binder MD;  [facility];  Service: Endoscopy;  Laterality: N/A;  . ESOPHAGOGASTRODUODENOSCOPY (EGD) WITH PROPOFOL  12/10/2018   Dr. FOneida Alar moderate Schatzki ring s/p dilation, gastritis, nonbleeding duodenal diverticulum  . POLYPECTOMY  12/10/2018   Procedure: POLYPECTOMY;  Surgeon: FDanie Binder MD;  [facility];  Service: Endoscopy;;  cold snare sigmoid polyp  . SAVORY DILATION  12/10/2018   Procedure: SAVORY DILATION;  Surgeon: Danie Binder, MD;  Location: [facility];  Service: Endoscopy;;  . TONSILLECTOMY    . TUBAL LIGATION    . WRIST SURGERY       OB History   No  obstetric history on file.     Family History  Problem Relation Age of Onset  . Colon cancer Brother        mid 86s, deceased  . Colon cancer Father        diagnosed at 7, deceased  . Hypertension Father   . Hypertension Mother   . Hyperlipidemia Mother   . COPD Mother   . Cancer Mother        lung  . Heart disease Mother   . Thyroid disease Mother   . Irritable bowel syndrome Mother   . Diabetes Sister   . Irritable bowel syndrome Sister   . COPD Brother     Social History   Tobacco Use  . Smoking status: Former Smoker    Packs/day: 1.00    Years: 25.00    Pack years: 25.00    Quit date: 07/10/1997    Years since quitting: 22.4  . Smokeless tobacco: Never Used  Vaping Use  . Vaping Use: Never used  Substance Use Topics  . Alcohol use: No  . Drug use: No    Home Medications Prior to Admission medications   Medication Sig Start Date End Date Taking? Authorizing Provider  aspirin EC 81 MG tablet Take 1 tablet (81 mg total) by mouth daily. 10/23/18   Minus Breeding, MD  blood glucose meter kit and supplies Dispense based on patient and insurance preference. Use up to four times daily as directed. (FOR ICD-10 E10.9, E11.9). 10/23/18   Baruch Gouty, FNP  DULoxetine (CYMBALTA) 30 MG capsule Take 1 capsule (30 mg total) by mouth daily for 30 days. 10/23/18 11/22/18  Baruch Gouty, FNP  furosemide (LASIX) 20 MG tablet Take 1 tablet (20 mg total) by mouth daily. 10/18/18   Baruch Gouty, FNP  metFORMIN (GLUCOPHAGE) 500 MG tablet Take 1 tablet (500 mg total) by mouth daily with breakfast. 10/18/18   Rakes, Connye Burkitt, FNP  metoprolol tartrate (LOPRESSOR) 25 MG tablet Take 1 tablet (25 mg total) by mouth 2 (two) times daily. 10/28/18   Minus Breeding, MD  polyethylene glycol-electrolytes (NULYTELY/GOLYTELY) 420 g solution TAKE 4000 MLS BY MOUTH ONCE FOR 1 DOSE 12/04/18   [provider]  potassium chloride SA (K-DUR,KLOR-CON) 20 MEQ tablet Take 1 tablet (20 mEq total) by  mouth daily. 10/18/18   Baruch Gouty, FNP  simethicone (MYLICON) 956 MG chewable tablet Chew 125 mg by mouth every 6 (six) hours as needed for flatulence.    [provider]    Allergies    Penicillins and Codeine  Review of Systems   Review of Systems  Constitutional: Negative for chills and fever.  Musculoskeletal: Negative for arthralgias, back pain and joint swelling.  Skin: Positive for wound.       Laceration right ring finger  Neurological: Negative for dizziness, weakness and numbness.  Hematological: Does not bruise/bleed easily.    Physical Exam Updated Vital Signs BP 117/72 (BP Location: Left Arm)   Pulse 88   Temp 98.3 F (36.8 C) (Oral)   Resp 18   Ht _0  (1.549 m)   Wt 70.3 kg   SpO2  98%   BMI 29.29 kg/m   Physical Exam Vitals and nursing note reviewed.  Constitutional:      General: She is not in acute distress.    Appearance: Normal appearance.  HENT:     Mouth/Throat:     Mouth: Mucous membranes are moist.  Cardiovascular:     Rate and Rhythm: Normal rate and regular rhythm.     Pulses: Normal pulses.  Pulmonary:     Effort: Pulmonary effort is normal.  Musculoskeletal:        General: No swelling, tenderness or deformity. Normal range of motion.  Skin:    General: Skin is warm.     Capillary Refill: Capillary refill takes 2 to 3 seconds.     Coloration: Skin is pale.     Findings: No erythema.     Comments: 1.5 cm well approximated laceration to the distal tip of the right ring finger.  On my exam, finger was wrapped with tape and Saran wrap.  When this was removed, the finger from the PIP joint to the tip appears pale, but pinks up after several seconds.  No nail injury, no bleeding  Neurological:     General: No focal deficit present.     Mental Status: She is alert.     Sensory: No sensory deficit.     Motor: No weakness.     ED Results / Procedures / Treatments   Labs (all labs ordered are listed, but only abnormal results  are displayed) Labs Reviewed - No data to display  EKG None  Radiology No results found.  Procedures Procedures (including critical care time)    LACERATION REPAIR Performed by: Shelsey Rieth Authorized by: Vivan Agostino Consent: Verbal consent obtained. Risks and benefits: risks, benefits and alternatives were discussed Consent given by: patient Patient identity confirmed: provided demographic data Prepped and Draped in normal sterile fashion Wound explored  Laceration Location: distal tip right ring finger  Laceration Length: 1.5 cm  No Foreign Bodies seen or palpated  Anesthesia: none   Irrigation method: syringe Amount of cleaning: standard  Skin closure: dermabond tissue adhesive  Technique: topical application  Patient tolerance: Patient tolerated the procedure well with no immediate complications.   Medications Ordered in ED Medications - No data to display  ED Course  I have reviewed the triage vital signs and the nursing notes.  Pertinent labs & imaging results that were available during my care of the patient were reviewed by me and considered in my medical decision making (see chart for details).    MDM Rules/Calculators/A&P                          Patient with superficial appearing laceration to the distal tip of the right ring finger.  Patient had occlusive dressing to the finger on arrival.  This was removed and skin appeared pallorous, but pinked up after several seconds.  She does have good cap refill.  No bony injuries and nail is intact.  Patient unsure of last tetanus, on review of her medical record, last tetanus was 2012.  It was recommended that she update her tetanus this evening but she declined stating this visit was "already costing too much."  No symptoms suggestive of infection.  wound was cleaned by me and approximated using Dermabond.  She tolerated well.  Discussed wound care instructions and she agrees to return to the ER if  needed.   Final Clinical Impression(s) / ED  Diagnoses Final diagnoses:  Laceration of right ring finger without foreign body without damage to nail, initial encounter    Rx / DC Orders ED Discharge Orders    None       Kem Parkinson, PA-C 12/20/19 2336    Maudie Flakes, MD 12/21/19 2326

## 2019-12-20 NOTE — ED Triage Notes (Signed)
Pt reports cutting her right ring finger last night on a chopper blade , bleeding controlled at this time

## 2020-06-08 ENCOUNTER — Other Ambulatory Visit (HOSPITAL_COMMUNITY): Payer: Self-pay | Admitting: Internal Medicine

## 2020-06-08 ENCOUNTER — Other Ambulatory Visit: Payer: Self-pay

## 2020-06-08 ENCOUNTER — Ambulatory Visit (HOSPITAL_COMMUNITY)
Admission: RE | Admit: 2020-06-08 | Discharge: 2020-06-08 | Disposition: A | Discharge status: Home / Self Care | Payer: 59 | Source: Ambulatory Visit | Attending: Internal Medicine | Admitting: Internal Medicine

## 2020-06-08 DIAGNOSIS — R0782 Intercostal pain: Secondary | ICD-10-CM | POA: Insufficient documentation

## 2020-06-15 ENCOUNTER — Encounter: Payer: Self-pay | Admitting: Internal Medicine

## 2020-08-04 ENCOUNTER — Ambulatory Visit: Payer: 59 | Admitting: Internal Medicine

## 2021-10-10 ENCOUNTER — Other Ambulatory Visit: Payer: Self-pay | Admitting: Family Medicine

## 2021-10-10 ENCOUNTER — Other Ambulatory Visit (HOSPITAL_COMMUNITY): Payer: Self-pay | Admitting: Family Medicine

## 2021-10-10 DIAGNOSIS — Q631 Lobulated, fused and horseshoe kidney: Secondary | ICD-10-CM

## 2021-10-15 ENCOUNTER — Emergency Department (HOSPITAL_COMMUNITY)
Admission: EM | Admit: 2021-10-15 | Discharge: 2021-10-15 | Disposition: A | Discharge status: Home / Self Care | Payer: 59 | Attending: Emergency Medicine | Admitting: Emergency Medicine

## 2021-10-15 ENCOUNTER — Other Ambulatory Visit: Payer: Self-pay

## 2021-10-15 ENCOUNTER — Emergency Department (HOSPITAL_COMMUNITY): Payer: 59

## 2021-10-15 ENCOUNTER — Ambulatory Visit
Admission: EM | Admit: 2021-10-15 | Discharge: 2021-10-15 | Disposition: A | Discharge status: Nursing Home (Includes ICF) | Payer: Self-pay | Attending: Family Medicine | Admitting: Family Medicine

## 2021-10-15 ENCOUNTER — Encounter (HOSPITAL_COMMUNITY): Payer: Self-pay

## 2021-10-15 DIAGNOSIS — R000 Tachycardia, unspecified: Secondary | ICD-10-CM

## 2021-10-15 DIAGNOSIS — K921 Melena: Secondary | ICD-10-CM | POA: Diagnosis not present

## 2021-10-15 DIAGNOSIS — R682 Dry mouth, unspecified: Secondary | ICD-10-CM | POA: Insufficient documentation

## 2021-10-15 DIAGNOSIS — Z7984 Long term (current) use of oral hypoglycemic drugs: Secondary | ICD-10-CM | POA: Diagnosis not present

## 2021-10-15 DIAGNOSIS — K625 Hemorrhage of anus and rectum: Secondary | ICD-10-CM | POA: Insufficient documentation

## 2021-10-15 DIAGNOSIS — Z8719 Personal history of other diseases of the digestive system: Secondary | ICD-10-CM

## 2021-10-15 DIAGNOSIS — F419 Anxiety disorder, unspecified: Secondary | ICD-10-CM | POA: Insufficient documentation

## 2021-10-15 DIAGNOSIS — Z7982 Long term (current) use of aspirin: Secondary | ICD-10-CM | POA: Diagnosis not present

## 2021-10-15 DIAGNOSIS — R1032 Left lower quadrant pain: Secondary | ICD-10-CM | POA: Diagnosis not present

## 2021-10-15 DIAGNOSIS — Z20822 Contact with and (suspected) exposure to covid-19: Secondary | ICD-10-CM | POA: Diagnosis not present

## 2021-10-15 DIAGNOSIS — R1031 Right lower quadrant pain: Secondary | ICD-10-CM | POA: Insufficient documentation

## 2021-10-15 DIAGNOSIS — R197 Diarrhea, unspecified: Secondary | ICD-10-CM

## 2021-10-15 DIAGNOSIS — R531 Weakness: Secondary | ICD-10-CM

## 2021-10-15 DIAGNOSIS — E119 Type 2 diabetes mellitus without complications: Secondary | ICD-10-CM | POA: Insufficient documentation

## 2021-10-15 DIAGNOSIS — R1030 Lower abdominal pain, unspecified: Secondary | ICD-10-CM | POA: Diagnosis present

## 2021-10-15 DIAGNOSIS — R1915 Other abnormal bowel sounds: Secondary | ICD-10-CM | POA: Insufficient documentation

## 2021-10-15 DIAGNOSIS — R109 Unspecified abdominal pain: Secondary | ICD-10-CM | POA: Diagnosis not present

## 2021-10-15 LAB — CBC WITH DIFFERENTIAL/PLATELET
Abs Immature Granulocytes: 0.03 10*3/uL (ref 0.00–0.07)
Basophils Absolute: 0 10*3/uL (ref 0.0–0.1)
Basophils Relative: 0 %
Eosinophils Absolute: 0.1 10*3/uL (ref 0.0–0.5)
Eosinophils Relative: 1 %
HCT: 44.4 % (ref 36.0–46.0)
Hemoglobin: 15.6 g/dL — ABNORMAL HIGH (ref 12.0–15.0)
Immature Granulocytes: 0 %
Lymphocytes Relative: 23 %
Lymphs Abs: 2.3 10*3/uL (ref 0.7–4.0)
MCH: 31.5 pg (ref 26.0–34.0)
MCHC: 35.1 g/dL (ref 30.0–36.0)
MCV: 89.7 fL (ref 80.0–100.0)
Monocytes Absolute: 0.8 10*3/uL (ref 0.1–1.0)
Monocytes Relative: 8 %
Neutro Abs: 6.6 10*3/uL (ref 1.7–7.7)
Neutrophils Relative %: 68 %
Platelets: 209 10*3/uL (ref 150–400)
RBC: 4.95 MIL/uL (ref 3.87–5.11)
RDW: 15.9 % — ABNORMAL HIGH (ref 11.5–15.5)
WBC: 9.9 10*3/uL (ref 4.0–10.5)
nRBC: 0 % (ref 0.0–0.2)

## 2021-10-15 LAB — COMPREHENSIVE METABOLIC PANEL
ALT: 18 U/L (ref 0–44)
AST: 19 U/L (ref 15–41)
Albumin: 4.8 g/dL (ref 3.5–5.0)
Alkaline Phosphatase: 83 U/L (ref 38–126)
Anion gap: 16 — ABNORMAL HIGH (ref 5–15)
BUN: 20 mg/dL (ref 8–23)
CO2: 20 mmol/L — ABNORMAL LOW (ref 22–32)
Calcium: 10.5 mg/dL — ABNORMAL HIGH (ref 8.9–10.3)
Chloride: 101 mmol/L (ref 98–111)
Creatinine, Ser: 1.11 mg/dL — ABNORMAL HIGH (ref 0.44–1.00)
GFR, Estimated: 57 mL/min — ABNORMAL LOW (ref >60)
Glucose, Bld: 152 mg/dL — ABNORMAL HIGH (ref 70–99)
Potassium: 3.6 mmol/L (ref 3.5–5.1)
Sodium: 137 mmol/L (ref 135–145)
Total Bilirubin: 1.4 mg/dL — ABNORMAL HIGH (ref 0.3–1.2)
Total Protein: 8.4 g/dL — ABNORMAL HIGH (ref 6.5–8.1)

## 2021-10-15 LAB — URINALYSIS, ROUTINE W REFLEX MICROSCOPIC
Bilirubin Urine: NEGATIVE
Glucose, UA: NEGATIVE mg/dL
Hgb urine dipstick: NEGATIVE
Ketones, ur: 20 mg/dL — AB
Leukocytes,Ua: NEGATIVE
Nitrite: NEGATIVE
Protein, ur: NEGATIVE mg/dL
Specific Gravity, Urine: <1.031 — ABNORMAL HIGH (ref 1.005–1.030)
pH: 5 (ref 5.0–8.0)

## 2021-10-15 LAB — RESP PANEL BY RT-PCR (FLU A&B, COVID) ARPGX2
Influenza A by PCR: NEGATIVE
Influenza B by PCR: NEGATIVE
SARS Coronavirus 2 by RT PCR: NEGATIVE

## 2021-10-15 LAB — LIPASE, BLOOD: Lipase: 28 U/L (ref 11–51)

## 2021-10-15 LAB — POC OCCULT BLOOD, ED: Fecal Occult Blood, POC: NEGATIVE

## 2021-10-15 MED ORDER — SODIUM CHLORIDE 0.9 % IV BOLUS
1000.0000 mL | Freq: Once | INTRAVENOUS | Status: AC
  Administered 2021-10-15: 1000 mL via INTRAVENOUS

## 2021-10-15 MED ORDER — IOHEXOL 300 MG/ML  SOLN
100.0000 mL | Freq: Once | INTRAMUSCULAR | Status: AC | PRN
  Administered 2021-10-15: 100 mL via INTRAVENOUS

## 2021-10-15 MED ORDER — ONDANSETRON 4 MG PO TBDP
4.0000 mg | ORAL_TABLET | Freq: Once | ORAL | Status: AC
  Administered 2021-10-15: 4 mg via ORAL

## 2021-10-15 MED ORDER — ONDANSETRON HCL 4 MG PO TABS: 4.0000 mg | ORAL_TABLET | Freq: Four times a day (QID) | ORAL | 0 refills | Status: DC

## 2021-10-15 MED ORDER — TRAMADOL HCL 50 MG PO TABS: 25.0000 mg | ORAL_TABLET | Freq: Four times a day (QID) | ORAL | 0 refills | Status: AC | PRN

## 2021-10-15 MED ORDER — FENTANYL CITRATE PF 50 MCG/ML IJ SOSY
100.0000 ug | PREFILLED_SYRINGE | Freq: Once | INTRAMUSCULAR | Status: AC
  Administered 2021-10-15: 100 ug via INTRAVENOUS
  Filled 2021-10-15: qty 2

## 2021-10-15 MED ORDER — ONDANSETRON HCL 4 MG/2ML IJ SOLN
4.0000 mg | Freq: Once | INTRAMUSCULAR | Status: AC
  Administered 2021-10-15: 4 mg via INTRAVENOUS
  Filled 2021-10-15: qty 2

## 2021-10-15 MED ORDER — FENTANYL CITRATE PF 50 MCG/ML IJ SOSY
50.0000 ug | PREFILLED_SYRINGE | Freq: Once | INTRAMUSCULAR | Status: AC
  Administered 2021-10-15: 50 ug via INTRAVENOUS
  Filled 2021-10-15: qty 1

## 2021-10-15 NOTE — ED Triage Notes (Signed)
Pt states that on Wednesday morning she felt like she had to go to the bathroom and she became sick vomiting with diarrhea ? ?Pt states that after that she started having cold chills and a headache ? ?Pt states she has blood in her bowel since Wednesday morning ? ?Pt states she has abdominal pain and is unable to eat and drink ? ?Pt states she tried Tylenol and Advil without any relief ?

## 2021-10-15 NOTE — ED Triage Notes (Signed)
Patient c/o abd pain, nausea, rectal bleeding since Wednesday. Patient hx of IBS, but states this feeling different, not eating/drinking. Also c/o chills and shaking  ?

## 2021-10-15 NOTE — ED Notes (Signed)
Pt able to contact sister for ride. Pt sister reported en route to pick up pt. ?

## 2021-10-15 NOTE — ED Notes (Signed)
Patient is being discharged from the Urgent Care and sent to the Emergency Department via POV . Per PA, patient is in need of higher level of care due to acute abdomen. Patient is aware and verbalizes understanding of plan of care.  ?Vitals:  ? 10/15/21 1217  ?BP: 119/77  ?Pulse: (!) 121  ?Resp: 20  ?Temp: 98.4 ?F (36.9 ?C)  ?SpO2: 98%  ?  ?

## 2021-10-15 NOTE — Discharge Instructions (Addendum)
You were seen in the emergency department today for abdominal pain.  Your work-up was reassuring.  There is no obvious indication for why you are having abdominal pain although you have irritable bowel syndrome and this may be the cause of your symptoms.  Please follow-up with your gastroenterologist this week.  You do need a repeat colonoscopy at your earliest convenience.  I am prescribing you some Zofran which may help with your nausea.  I am also prescribing you a low-dose of Ultram.  You have been prescribed a medication that is considered an opiate. Opiates are pain medications that should be used with caution. It is important that you do not drive while taking this medication as it can cause drowsiness and impaired reaction times. Do not mix this medication with benzodiazepine medications or alcohol as this can cause respiratory depression. Additionally, opiates have addicting properties to them. Please use medication as prescribed by your provider.  Opiates also contribute to constipation.  You struggle with constipation.  I am giving you the lowest dose to use of this medication.  Please only use this if you are having severe pain as it may contribute to worsening constipation and your symptoms.  Please drink plenty of fluids.  Please return for worsening symptoms. ?

## 2021-10-15 NOTE — ED Provider Notes (Signed)
?[facility] ?Provider Note ? ? ?CSN: [id number] ?Arrival date & time: 10/15/21  1324 ? ?  ? ?History ? ?Chief Complaint  ?Patient presents with  ? Abdominal Pain  ? Rectal Bleeding  ? ? ?[name] is a 61 y.o. female.  With past medical history of GERD, IBS-C, dysphagia, type 2 diabetes who presents to the emergency department with abdominal pain. ? ?Patient states that she began having nausea and abdominal pain on Thursday morning.  She states that she woke up feeling like she may need to have a bowel movement but when she went to the bathroom she was constipated.  She states after this she then began having nausea and vomiting.  She states that since then she has had worsening, severe, constant abdominal pain that she describes as soreness.  She states that his primary lower abdominal pain but can also be generalized at times.  She has had associated chills and decreased p.o. intake.  Has had associated rectal bleeding that was Thursday and yesterday which is stopped.  She denies any urinary symptoms or vaginal discharge.  She states that she went to urgent care prior to this and they sent her here to the emergency department for evaluation.  She states that this does not feel like her typical IBS symptoms. ? ? ?Abdominal Pain ?Associated symptoms: chills, constipation, hematochezia, nausea and vomiting   ?Associated symptoms: no chest pain, no dysuria, no fever, no shortness of breath and no vaginal discharge   ?Rectal Bleeding ?Associated symptoms: abdominal pain and vomiting   ?Associated symptoms: no fever   ? ?  ? ?Home Medications ?Prior to Admission medications   ?Medication Sig Start Date End Date Taking? Authorizing Provider  ?aspirin EC 81 MG tablet Take 1 tablet (81 mg total) by mouth daily. 10/23/18   Minus Breeding, MD  ?blood glucose meter kit and supplies Dispense based on patient and insurance preference. Use up to four times daily as directed. (FOR ICD-10 E10.9, E11.9).  10/23/18   Baruch Gouty, FNP  ?DULoxetine (CYMBALTA) 30 MG capsule Take 1 capsule (30 mg total) by mouth daily for 30 days. 10/23/18 11/22/18  Baruch Gouty, FNP  ?furosemide (LASIX) 20 MG tablet Take 1 tablet (20 mg total) by mouth daily. 10/18/18   Baruch Gouty, FNP  ?metFORMIN (GLUCOPHAGE) 500 MG tablet Take 1 tablet (500 mg total) by mouth daily with breakfast. 10/18/18   Rakes, Connye Burkitt, FNP  ?metoprolol tartrate (LOPRESSOR) 25 MG tablet Take 1 tablet (25 mg total) by mouth 2 (two) times daily. 10/28/18   Minus Breeding, MD  ?polyethylene glycol-electrolytes (NULYTELY/GOLYTELY) 420 g solution TAKE 4000 MLS BY MOUTH ONCE FOR 1 DOSE 12/04/18   [provider]  ?potassium chloride SA (K-DUR,KLOR-CON) 20 MEQ tablet Take 1 tablet (20 mEq total) by mouth daily. 10/18/18   Baruch Gouty, FNP  ?simethicone (MYLICON) 625 MG chewable tablet Chew 125 mg by mouth every 6 (six) hours as needed for flatulence.    [provider]  ?   ? ?Allergies    ?Penicillins and Codeine   ? ?Review of Systems   ?Review of Systems  ?Constitutional:  Positive for appetite change and chills. Negative for fever.  ?Respiratory:  Negative for shortness of breath.   ?Cardiovascular:  Negative for chest pain.  ?Gastrointestinal:  Positive for abdominal pain, blood in stool, constipation, hematochezia, nausea and vomiting.  ?Genitourinary:  Negative for dysuria and vaginal discharge.  ?Neurological:  Positive for headaches.  ?  All other systems reviewed and are negative. ? ?Physical Exam ?Updated Vital Signs ?BP 126/87   Pulse (!) 107   Temp 98.2 ?F (36.8 ?C) (Oral)   Resp 14   Ht 5' 2"  (1.575 m)   Wt 71.2 kg   SpO2 100%   BMI 28.72 kg/m?  ?Physical Exam ?Vitals and nursing note reviewed. Exam conducted with a chaperone present.  ?Constitutional:   ?   General: She is not in acute distress. ?   Appearance: Normal appearance. She is well-developed. She is ill-appearing. She is not toxic-appearing.  ?HENT:  ?   Head:  Normocephalic and atraumatic.  ?   Nose: Nose normal.  ?   Mouth/Throat:  ?   Mouth: Mucous membranes are dry.  ?   Pharynx: Oropharynx is clear.  ?Eyes:  ?   General: No scleral icterus. ?   Extraocular Movements: Extraocular movements intact.  ?   Pupils: Pupils are equal, round, and reactive to light.  ?Cardiovascular:  ?   Rate and Rhythm: Regular rhythm. Tachycardia present.  ?   Pulses: Normal pulses.  ?   Heart sounds: Normal heart sounds. No murmur heard. ?Pulmonary:  ?   Effort: Pulmonary effort is normal. No respiratory distress.  ?Abdominal:  ?   General: Abdomen is protuberant. Bowel sounds are increased. There is no distension.  ?   Palpations: Abdomen is soft.  ?   Tenderness: There is generalized abdominal tenderness and tenderness in the right lower quadrant and left lower quadrant. There is guarding. There is no right CVA tenderness, left CVA tenderness or rebound.  ?   Hernia: No hernia is present.  ?Genitourinary: ?   Rectum: Normal. Guaiac result negative. No mass.  ?Musculoskeletal:     ?   General: Normal range of motion.  ?   Cervical back: Neck supple.  ?Skin: ?   General: Skin is warm and dry.  ?   Capillary Refill: Capillary refill takes less than 2 seconds.  ?   Coloration: Skin is not jaundiced.  ?Neurological:  ?   General: No focal deficit present.  ?   Mental Status: She is alert and oriented to person, place, and time. Mental status is at baseline.  ?Psychiatric:     ?   Mood and Affect: Mood is anxious.     ?   Behavior: Behavior normal.  ? ? ?ED Results / Procedures / Treatments   ?Labs ?(all labs ordered are listed, but only abnormal results are displayed) ?Labs Reviewed  ?COMPREHENSIVE METABOLIC PANEL - Abnormal; Notable for the following components:  ?    Result Value  ? CO2 20 (*)   ? Glucose, Bld 152 (*)   ? Creatinine, Ser 1.11 (*)   ? Calcium 10.5 (*)   ? Total Protein 8.4 (*)   ? Total Bilirubin 1.4 (*)   ? GFR, Estimated 57 (*)   ? Anion gap 16 (*)   ? All other components  within normal limits  ?CBC WITH DIFFERENTIAL/PLATELET - Abnormal; Notable for the following components:  ? Hemoglobin 15.6 (*)   ? RDW 15.9 (*)   ? All other components within normal limits  ?URINALYSIS, ROUTINE W REFLEX MICROSCOPIC - Abnormal; Notable for the following components:  ? Specific Gravity, Urine <1.031 (*)   ? Ketones, ur 20 (*)   ? All other components within normal limits  ?POC OCCULT BLOOD, ED - Normal  ?RESP PANEL BY RT-PCR (FLU A&B, COVID) [id number]  ?LIPASE, BLOOD  ?  OCCULT BLOOD X 1 CARD TO LAB, STOOL  ? ?EKG ?None ? ?Radiology ?CT Abdomen Pelvis W Contrast ? ?Result Date: 10/15/2021 ?CLINICAL DATA:  General abd pain-worse in lower abd, nausea, rectal bleeding since Wednesday. Patient hx of IBS, but states this feeling different, not eating/drinking. Also c/o chills and shaking. EXAM: CT ABDOMEN AND PELVIS WITH CONTRAST TECHNIQUE: Multidetector CT imaging of the abdomen and pelvis was performed using the standard protocol following bolus administration of intravenous contrast. RADIATION DOSE REDUCTION: This exam was performed according to the departmental dose-optimization program which includes automated exposure control, adjustment of the mA and/or kV according to patient size and/or use of iterative reconstruction technique. CONTRAST:  147m OMNIPAQUE IOHEXOL 300 MG/ML  SOLN COMPARISON:  06/13/2016. FINDINGS: Lower chest: Stable fibrotic changes at the lung bases. No acute findings. Hepatobiliary: No focal liver abnormality is seen. No gallstones, gallbladder wall thickening, or biliary dilatation. Pancreas: Unremarkable. No pancreatic ductal dilatation or surrounding inflammatory changes. Spleen: Normal in size without focal abnormality. Adrenals/Urinary Tract: No adrenal mass. Horseshoe kidney. Small calcification in the inferior aspect of the or shoe kidney. No masses. No hydronephrosis. Normal ureters. Normal bladder. Stomach/Bowel: Normal stomach. Small bowel and colon are normal in caliber. No  wall thickening. Sigmoid colon is decompressed, but without convincing wall thickening. No inflammation. Scattered colonic diverticula mostly on the left. No diverticulitis. No appendicitis. Vascular/Lymphatic: Mild

## 2021-10-19 NOTE — ED Provider Notes (Signed)
?[place] ? ? ? ?CSN: [id number] ?Arrival date & time: 10/15/21  1021 ? ? ?  ? ?History   ?Chief Complaint ?Chief Complaint  ?Patient presents with  ? Abdominal Pain  ?  Nausea, vomiting abdominal pain and headache  ? ? ?HPI ?[name] is a 61 y.o. female.  ? ?Patient presenting today with 4-day history of progressively worsening lower abdominal pain, chills, headache, nausea, vomiting, bloody diarrhea.  She states that this time her pain is 11 out of 10 and she can barely move as this worsens her pain.  She denies new foods, medications, recent travel, sick contacts.  Does have a history of diverticular disease with diverticulitis that is felt similar.  She does also have a history of hemorrhoids and IBS constipation.  Has been trying over-the-counter pain relievers with no relief.  Currently unable to eat or drink due to the pain. ? ?Past Medical History:  ?Diagnosis Date  ? DDD (degenerative disc disease)   ? spinal injections  ? Diabetes mellitus without complication ([place])   ? Diverticular disease   ? GERD (gastroesophageal reflux disease)   ? Horseshoe kidney   ? Hypercholesterolemia   ? Irritable bowel syndrome   ? ?Patient Active Problem List  ? Diagnosis Date Noted  ? Chronic bilateral low back pain with bilateral sciatica 05/26/2019  ? Leg swelling 10/23/2018  ? Nonspecific abnormal electrocardiogram (ECG) (EKG) 10/23/2018  ? Type 2 diabetes mellitus with hyperglycemia, without long-term current use of insulin ([place]) 10/17/2018  ? DDD (degenerative disc disease), lumbar 09/26/2018  ? DDD (degenerative disc disease), cervical 09/26/2018  ? Paresthesia of arm 09/26/2018  ? Chronic neck and back pain 09/26/2018  ? Depression, recurrent ([place]) 09/26/2018  ? Hyperglycemia 08/29/2018  ? Dysphagia, pharyngoesophageal phase   ? Dysphagia 03/07/2013  ? GERD, SEVERE 06/29/2009  ? Irritable bowel syndrome 06/29/2009  ? Hemorrhoids, internal 06/29/2009  ? ?Past Surgical History:  ?Procedure Laterality  Date  ? cervical dysplasia    ? COLONOSCOPY  07/01/2009  ? ONG:[id number] seen in the ascending and sigmoid colon/small internal hemorrhoids/6-mm sessile polyp/4-mm sessile polyp.3-mm sessile polyp removed. simple adenomas  ? COLONOSCOPY N/A 03/25/2013  ? Procedure: COLONOSCOPY;  Surgeon: Danie Binder, MD;  [facility];  Service: Endoscopy;  Laterality: N/A;  10:15  ? COLONOSCOPY WITH PROPOFOL N/A 12/10/2018  ? Dr. Oneida Alar: 58m serrated colon polyp, normal TI, hemorrhoids, diverticulosis. next TCS in five years.   ? ESOPHAGEAL DILATION N/A 02/04/2015  ? Procedure: ESOPHAGEAL DILATION;  Surgeon: SDanie Binder MD;  [facility];  Service: Endoscopy;  Laterality: N/A;  ? ESOPHAGOGASTRODUODENOSCOPY  07/01/2009  ? SWNU:UVOZDGesophagus/dilation to 16 mm possible proximal cervical web  ? ESOPHAGOGASTRODUODENOSCOPY N/A 02/04/2015  ? Procedure: ESOPHAGOGASTRODUODENOSCOPY (EGD);  Surgeon: SDanie Binder MD;  [facility];  Service: Endoscopy;  Laterality: N/A;  0800-moved to 845 Office to notify pt  ? ESOPHAGOGASTRODUODENOSCOPY (EGD) WITH ESOPHAGEAL DILATION N/A 03/25/2013  ? Procedure: ESOPHAGOGASTRODUODENOSCOPY (EGD) WITH ESOPHAGEAL DILATION;  Surgeon: SDanie Binder MD;  [facility];  Service: Endoscopy;  Laterality: N/A;  ? ESOPHAGOGASTRODUODENOSCOPY (EGD) WITH PROPOFOL  12/10/2018  ? Dr. FOneida Alar moderate Schatzki ring s/p dilation, gastritis, nonbleeding duodenal diverticulum  ? POLYPECTOMY  12/10/2018  ? Procedure: POLYPECTOMY;  Surgeon: FDanie Binder MD;  [facility];  Service: Endoscopy;;  cold snare sigmoid polyp  ? SAVORY DILATION  12/10/2018  ? Procedure: SAVORY DILATION;  Surgeon: FDanie Binder MD;  [facility];  Service: Endoscopy;;  ? TONSILLECTOMY    ? TUBAL LIGATION    ? WRIST SURGERY    ? ? ?OB History   ?No obstetric history on file. ?  ? ? ?Home Medications   ? ?Prior to Admission medications   ?Medication Sig Start Date End Date Taking?  Authorizing Provider  ?aspirin EC 81 MG tablet Take 1 tablet (81 mg total) by mouth daily. ?Patient not taking: Reported on 10/15/2021 10/23/18   Minus Breeding, MD  ?blood glucose meter kit and supplies Dispense based on patient and insurance preference. Use up to four times daily as directed. (FOR ICD-10 E10.9, E11.9). 10/23/18   Baruch Gouty, FNP  ?Ca Carbonate-Mag Hydroxide (ROLAIDS PO) Take by mouth.    [provider]  ?DULoxetine (CYMBALTA) 30 MG capsule Take 1 capsule (30 mg total) by mouth daily for 30 days. ?Patient not taking: Reported on 10/15/2021 10/23/18 11/22/18  Baruch Gouty, FNP  ?furosemide (LASIX) 20 MG tablet Take 1 tablet (20 mg total) by mouth daily. ?Patient not taking: Reported on 10/15/2021 10/18/18   Baruch Gouty, FNP  ?linaCLOtide (LINZESS PO) Take 72 mcg by mouth as needed (constipation).    [provider]  ?metFORMIN (GLUCOPHAGE) 500 MG tablet Take 1 tablet (500 mg total) by mouth daily with breakfast. ?Patient not taking: Reported on 10/15/2021 10/18/18   Baruch Gouty, FNP  ?methocarbamol (ROBAXIN) 750 MG tablet Take 750 mg by mouth daily as needed for muscle spasms.    [provider]  ?metoprolol tartrate (LOPRESSOR) 25 MG tablet Take 1 tablet (25 mg total) by mouth 2 (two) times daily. ?Patient not taking: Reported on 10/15/2021 10/28/18   Minus Breeding, MD  ?omeprazole (PRILOSEC) 20 MG capsule Take 20 mg by mouth as needed (acid reflux).    [provider]  ?ondansetron (ZOFRAN) 4 MG tablet Take 1 tablet (4 mg total) by mouth every 6 (six) hours. 10/15/21   Mickie Hillier, PA-C  ?polyethylene glycol-electrolytes (NULYTELY/GOLYTELY) 420 g solution TAKE 4000 MLS BY MOUTH ONCE FOR 1 DOSE ?Patient not taking: Reported on 10/15/2021 12/04/18   [provider]  ?potassium chloride SA (K-DUR,KLOR-CON) 20 MEQ tablet Take 1 tablet (20 mEq total) by mouth daily. ?Patient not taking: Reported on 10/15/2021 10/18/18   Baruch Gouty, FNP  ?simethicone (MYLICON)  202 MG chewable tablet Chew 125 mg by mouth every 6 (six) hours as needed for flatulence.    [provider]  ?traMADol (ULTRAM) 50 MG tablet Take 0.5 tablets (25 mg total) by mouth every 6 (six) hours as needed for up to 5 days for severe pain. 10/15/21 10/20/21  Mickie Hillier, PA-C  ? ? ?Family History ?Family History  ?Problem Relation Age of Onset  ? Colon cancer Brother   ?     mid 64s, deceased  ? Colon cancer Father   ?     diagnosed at 52, deceased  ? Hypertension Father   ? Hypertension Mother   ? Hyperlipidemia Mother   ? COPD Mother   ? Cancer Mother   ?     lung  ? Heart disease Mother   ? Thyroid disease Mother   ? Irritable bowel syndrome Mother   ? Diabetes Sister   ? Irritable bowel syndrome Sister   ? COPD Brother   ? ? ?Social History ?Social History  ? ?Tobacco Use  ? Smoking status: Former  ?  Packs/day: 1.00  ?  Years:  25.00  ?  Pack years: 25.00  ?  Types: Cigarettes  ?  Quit date: 07/10/1997  ?  Years since quitting: 24.2  ? Smokeless tobacco: Never  ?Vaping Use  ? Vaping Use: Never used  ?Substance Use Topics  ? Alcohol use: No  ? Drug use: No  ? ? ? ?Allergies   ?Penicillins and Codeine ? ? ?Review of Systems ?Review of Systems ?Per HPI ? ?Physical Exam ?Triage Vital Signs ?ED Triage Vitals  ?Enc Vitals Group  ?   BP 10/15/21 1217 119/77  ?   Pulse Rate 10/15/21 1217 (!) 121  ?   Resp 10/15/21 1217 20  ?   Temp 10/15/21 1217 98.4 ?F (36.9 ?C)  ?   Temp Source 10/15/21 1217 Oral  ?   SpO2 10/15/21 1217 98 %  ?   Weight --   ?   Height --   ?   Head Circumference --   ?   Peak Flow --   ?   Pain Score 10/15/21 1214 10  ?   Pain Loc --   ?   Pain Edu? --   ?   Excl. in [place]? --   ? ?No data found. ? ?Updated Vital Signs ?BP 131/86 (BP Location: Right Arm)   Pulse (!) 124   Temp 98.4 ?F (36.9 ?C) (Oral)   Resp (!) 22   SpO2 96%  ? ?Visual Acuity ?Right Eye Distance:   ?Left Eye Distance:   ?Bilateral Distance:   ? ?Right Eye Near:   ?Left Eye Near:    ?Bilateral Near:    ? ?Physical  Exam ?Vitals and nursing note reviewed.  ?Constitutional:   ?   Appearance: Normal appearance. She is ill-appearing.  ?   Comments: Crying, moaning, clutching her abdomen, breathing fast  ?HENT:  ?   Head:

## 2021-10-27 ENCOUNTER — Encounter: Payer: Self-pay | Admitting: *Deleted

## 2021-10-27 ENCOUNTER — Ambulatory Visit: Payer: 59 | Admitting: Internal Medicine

## 2021-10-27 ENCOUNTER — Telehealth: Payer: Self-pay | Admitting: *Deleted

## 2021-10-27 VITALS — BP 118/60 | HR 81 | Temp 97.3°F | Ht 62.0 in | Wt 150.6 lb

## 2021-10-27 DIAGNOSIS — K5900 Constipation, unspecified: Secondary | ICD-10-CM | POA: Diagnosis not present

## 2021-10-27 DIAGNOSIS — R1319 Other dysphagia: Secondary | ICD-10-CM | POA: Diagnosis not present

## 2021-10-27 DIAGNOSIS — K219 Gastro-esophageal reflux disease without esophagitis: Secondary | ICD-10-CM | POA: Diagnosis not present

## 2021-10-27 DIAGNOSIS — K625 Hemorrhage of anus and rectum: Secondary | ICD-10-CM | POA: Diagnosis not present

## 2021-10-27 MED ORDER — PEG 3350-KCL-NA BICARB-NACL 420 G PO SOLR: ORAL | 0 refills | Status: DC

## 2021-10-27 MED ORDER — LINACLOTIDE 145 MCG PO CAPS: 145.0000 ug | ORAL_CAPSULE | Freq: Every day | ORAL | 1 refills | Status: DC

## 2021-10-27 NOTE — Patient Instructions (Signed)
We will schedule you for upper endoscopy to further evaluate your difficulty swallowing.  I will likely stretch your esophagus at that time. ? ?At the same time we will perform colonoscopy given your rectal bleeding and history of polyps. ? ?I am happy to hear that you are feeling better. ? ?I will send in prescription for Linzess 145 mcg daily.  I would wait to start this medication until your diarrhea has resolved. ? ?It was very nice meeting you today. ? ?Dr. Abbey Chatters ? ?At [facility] we value your feedback. You may receive a survey about your visit today. Please share your experience as we strive to create trusting relationships with our patients to provide genuine, compassionate, quality care. ? ?We appreciate your understanding and patience as we review any laboratory studies, imaging, and other diagnostic tests that are ordered as we care for you. Our office policy is 5 business days for review of these results, and any emergent or urgent results are addressed in a timely manner for your best interest. If you do not hear from our office in 1 week, please contact us.  ? ?We also encourage the use of MyChart, which contains your medical information for your review as well. If you are not enrolled in this feature, an access code is on this after visit summary for your convenience. Thank you for allowing [place] to be involved in your care. ? ?It was great to see you today!  I hope you have a great rest of your Spring! ? ? ? ?[name]. Abbey Chatters, D.O. ?Gastroenterology and Hepatology ?[facility] ? ?

## 2021-10-27 NOTE — H&P (View-Only) (Signed)
? ? ?Referring Provider: Leslie Andrea, MD ?Primary Care Physician:  Leslie Andrea, MD ?Primary GI:  Dr. Abbey Chatters ? ?Chief Complaint  ?Patient presents with  ? Colonoscopy  ?  Pt here for evaluation for colonoscopy and EGD. Pt has IBS-C  ? ? ?HPI:   ?[name] is a 61 y.o. female who presents to the clinic today for follow-up visit.  Last seen in our clinic 2020.  History of chronic idiopathic constipation.  Currently taking Linzess 72 mcg as needed.  Cost has been an issue though she states she has new insurance now.  States the 72 mcg dosing is not adequate. ? ?Family history of colon cancer in brother and father.  Last colonoscopy 2020 with tubular adenoma removed.  5-year recall. ? ?States she was in her normal state of health until approximately 2 weeks ago when she had sudden onset severe abdominal pain, nausea vomiting, diarrhea.  Presented to the ER, blood work largely unremarkable.  CT abdomen pelvis with contrast also unremarkable. ? ?Her symptoms continued.  She found some old metronidazole at home and started taking this.  She has been taking it for 5 days and states she feels much better today.  Still having 1-2 loose bowel movements daily but starting to form.  No nausea or vomiting.  Abdominal pain resolved. ? ?Does note diffuse rectal bleeding for 2 weeks.  Has been filling the toilet bowl up with blood.  No rectal pain or discomfort. ? ?Also with chronic GERD for which she takes omeprazole 20 mg 2-3 times daily.  States it keeps her reflux under control.  History of chronic esophageal dysphagia.  Last EGD in 2020 with Schatzki's ring status post dilation.  She states that she is getting "choked" on food regularly again.  She wishes to have repeat EGD with dilation. ? ?Past Medical History:  ?Diagnosis Date  ? DDD (degenerative disc disease)   ? spinal injections  ? Diabetes mellitus without complication ([place])   ? Diverticular disease   ? GERD (gastroesophageal reflux disease)   ?  Horseshoe kidney   ? Hypercholesterolemia   ? Irritable bowel syndrome   ? ? ?Past Surgical History:  ?Procedure Laterality Date  ? cervical dysplasia    ? COLONOSCOPY  07/01/2009  ? WUJ:[id number] seen in the ascending and sigmoid colon/small internal hemorrhoids/6-mm sessile polyp/4-mm sessile polyp.3-mm sessile polyp removed. simple adenomas  ? COLONOSCOPY N/A 03/25/2013  ? Procedure: COLONOSCOPY;  Surgeon: Danie Binder, MD;  [facility];  Service: Endoscopy;  Laterality: N/A;  10:15  ? COLONOSCOPY WITH PROPOFOL N/A 12/10/2018  ? Dr. Oneida Alar: 23m serrated colon polyp, normal TI, hemorrhoids, diverticulosis. next TCS in five years.   ? ESOPHAGEAL DILATION N/A 02/04/2015  ? Procedure: ESOPHAGEAL DILATION;  Surgeon: SDanie Binder MD;  [facility];  Service: Endoscopy;  Laterality: N/A;  ? ESOPHAGOGASTRODUODENOSCOPY  07/01/2009  ? SAOZ:HYQMVHesophagus/dilation to 16 mm possible proximal cervical web  ? ESOPHAGOGASTRODUODENOSCOPY N/A 02/04/2015  ? Procedure: ESOPHAGOGASTRODUODENOSCOPY (EGD);  Surgeon: SDanie Binder MD;  [facility];  Service: Endoscopy;  Laterality: N/A;  0800-moved to 845 Office to notify pt  ? ESOPHAGOGASTRODUODENOSCOPY (EGD) WITH ESOPHAGEAL DILATION N/A 03/25/2013  ? Procedure: ESOPHAGOGASTRODUODENOSCOPY (EGD) WITH ESOPHAGEAL DILATION;  Surgeon: SDanie Binder MD;  [facility];  Service: Endoscopy;  Laterality: N/A;  ? ESOPHAGOGASTRODUODENOSCOPY (EGD) WITH PROPOFOL  12/10/2018  ? Dr. FOneida Alar moderate Schatzki ring s/p dilation, gastritis, nonbleeding duodenal diverticulum  ? POLYPECTOMY  12/10/2018  ? Procedure: POLYPECTOMY;  Surgeon: Danie Binder, MD;  [facility];  Service: Endoscopy;;  cold snare sigmoid polyp  ? SAVORY DILATION  12/10/2018  ? Procedure: SAVORY DILATION;  Surgeon: Danie Binder, MD;  [facility];  Service: Endoscopy;;  ? TONSILLECTOMY    ? TUBAL LIGATION    ? WRIST SURGERY    ? ? ?Current Outpatient  Medications  ?Medication Sig Dispense Refill  ? Ca Carbonate-Mag Hydroxide (ROLAIDS PO) Take by mouth.    ? linaCLOtide (LINZESS PO) Take 72 mcg by mouth as needed (constipation).    ? methocarbamol (ROBAXIN) 750 MG tablet Take 750 mg by mouth daily as needed for muscle spasms.    ? omeprazole (PRILOSEC) 20 MG capsule Take 20 mg by mouth as needed (acid reflux).    ? simethicone (MYLICON) 502 MG chewable tablet Chew 125 mg by mouth every 6 (six) hours as needed for flatulence.    ? polyethylene glycol-electrolytes (NULYTELY/GOLYTELY) 420 g solution TAKE 4000 MLS BY MOUTH ONCE FOR 1 DOSE (Patient not taking: Reported on 10/15/2021)    ? ?No current facility-administered medications for this visit.  ? ? ?Allergies as of 10/27/2021 - Review Complete 10/27/2021  ?Allergen Reaction Noted  ? Penicillins Anaphylaxis and Itching 07/24/2011  ? Codeine Nausea And Vomiting 09/22/2014  ? ? ?Family History  ?Problem Relation Age of Onset  ? Colon cancer Brother   ?     mid 2s, deceased  ? Colon cancer Father   ?     diagnosed at 53, deceased  ? Hypertension Father   ? Hypertension Mother   ? Hyperlipidemia Mother   ? COPD Mother   ? Cancer Mother   ?     lung  ? Heart disease Mother   ? Thyroid disease Mother   ? Irritable bowel syndrome Mother   ? Diabetes Sister   ? Irritable bowel syndrome Sister   ? COPD Brother   ? ? ?Social History  ? ?Socioeconomic History  ? Marital status: Legally Separated  ?  Spouse name: Not on file  ? Number of children: Not on file  ? Years of education: Not on file  ? Highest education level: Not on file  ?Occupational History  ? Occupation: [employer]  ?  Employer: [employer]  ?  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  ?Tobacco Use  ? Smoking status: Former  ?  Packs/day: 1.00  ?  Years: 25.00  ?  Pack years: 25.00  ?  Types: Cigarettes  ?  Quit date: 07/10/1997  ?  Years since quitting: 24.3  ? Smokeless tobacco: Never  ?Vaping Use  ? Vaping Use: Never used  ?Substance and Sexual Activity  ? Alcohol  use: No  ? Drug use: No  ? Sexual activity: Not Currently  ?  Birth control/protection: None  ?Other Topics Concern  ? Not on file  ?Social History Narrative  ? Lives alone.  CNA at [employer].    ? ?Social Determinants of Health  ? ?Financial Resource Strain: Not on file  ?Food Insecurity: Not on file  ?Transportation Needs: Not on file  ?Physical Activity: Not on file  ?Stress: Not on file  ?Social Connections: Not on file  ? ? ?Subjective: ?Review of Systems  ?Constitutional:  Negative for chills and fever.  ?HENT:  Negative for congestion and hearing loss.   ?Eyes:  Negative for blurred vision and double vision.  ?Respiratory:  Negative for  cough and shortness of breath.   ?Cardiovascular:  Negative for chest pain and palpitations.  ?Gastrointestinal:  Positive for blood in stool, diarrhea and heartburn. Negative for abdominal pain, constipation, melena and vomiting.  ?     Dysphagia  ?Genitourinary:  Negative for dysuria and urgency.  ?Musculoskeletal:  Negative for joint pain and myalgias.  ?Skin:  Negative for itching and rash.  ?Neurological:  Negative for dizziness and headaches.  ?Psychiatric/Behavioral:  Negative for depression. The patient is not nervous/anxious.   ? ? ?Objective: ?BP 118/60   Pulse 81   Temp (!) 97.3 ?F (36.3 ?C)   Ht '5\' 2"'$  (1.575 m)   Wt 150 lb 9.6 oz (68.3 kg)   BMI 27.55 kg/m?  ?Physical Exam ?Constitutional:   ?   Appearance: Normal appearance.  ?HENT:  ?   Head: Normocephalic and atraumatic.  ?Eyes:  ?   Extraocular Movements: Extraocular movements intact.  ?   Conjunctiva/sclera: Conjunctivae normal.  ?Cardiovascular:  ?   Rate and Rhythm: Normal rate and regular rhythm.  ?Pulmonary:  ?   Effort: Pulmonary effort is normal.  ?   Breath sounds: Wheezing present.  ?Abdominal:  ?   General: Bowel sounds are normal.  ?   Palpations: Abdomen is soft.  ?Musculoskeletal:     ?   General: No swelling. Normal range of motion.  ?   Cervical back: Normal range of motion and neck supple.   ?Skin: ?   General: Skin is warm and dry.  ?   Coloration: Skin is not jaundiced.  ?Neurological:  ?   General: No focal deficit present.  ?   Mental Status: She is alert and oriented to person, place, and time.  ?[place]

## 2021-10-27 NOTE — Progress Notes (Signed)
? ? ?Referring Provider: Leslie Andrea, MD ?Primary Care Physician:  Leslie Andrea, MD ?Primary GI:  Dr. Abbey Chatters ? ?Chief Complaint  ?Patient presents with  ? Colonoscopy  ?  Pt here for evaluation for colonoscopy and EGD. Pt has IBS-C  ? ? ?HPI:   ?[name] is a 61 y.o. female who presents to the clinic today for follow-up visit.  Last seen in our clinic 2020.  History of chronic idiopathic constipation.  Currently taking Linzess 72 mcg as needed.  Cost has been an issue though she states she has new insurance now.  States the 72 mcg dosing is not adequate. ? ?Family history of colon cancer in brother and father.  Last colonoscopy 2020 with tubular adenoma removed.  5-year recall. ? ?States she was in her normal state of health until approximately 2 weeks ago when she had sudden onset severe abdominal pain, nausea vomiting, diarrhea.  Presented to the ER, blood work largely unremarkable.  CT abdomen pelvis with contrast also unremarkable. ? ?Her symptoms continued.  She found some old metronidazole at home and started taking this.  She has been taking it for 5 days and states she feels much better today.  Still having 1-2 loose bowel movements daily but starting to form.  No nausea or vomiting.  Abdominal pain resolved. ? ?Does note diffuse rectal bleeding for 2 weeks.  Has been filling the toilet bowl up with blood.  No rectal pain or discomfort. ? ?Also with chronic GERD for which she takes omeprazole 20 mg 2-3 times daily.  States it keeps her reflux under control.  History of chronic esophageal dysphagia.  Last EGD in 2020 with Schatzki's ring status post dilation.  She states that she is getting "choked" on food regularly again.  She wishes to have repeat EGD with dilation. ? ?Past Medical History:  ?Diagnosis Date  ? DDD (degenerative disc disease)   ? spinal injections  ? Diabetes mellitus without complication ([place])   ? Diverticular disease   ? GERD (gastroesophageal reflux disease)   ?  Horseshoe kidney   ? Hypercholesterolemia   ? Irritable bowel syndrome   ? ? ?Past Surgical History:  ?Procedure Laterality Date  ? cervical dysplasia    ? COLONOSCOPY  07/01/2009  ? IOM:BTDHRCBULAG seen in the ascending and sigmoid colon/small internal hemorrhoids/6-mm sessile polyp/4-mm sessile polyp.3-mm sessile polyp removed. simple adenomas  ? COLONOSCOPY N/A 03/25/2013  ? Procedure: COLONOSCOPY;  Surgeon: Danie Binder, MD;  [facility];  Service: Endoscopy;  Laterality: N/A;  10:15  ? COLONOSCOPY WITH PROPOFOL N/A 12/10/2018  ? Dr. Oneida Alar: 4m serrated colon polyp, normal TI, hemorrhoids, diverticulosis. next TCS in five years.   ? ESOPHAGEAL DILATION N/A 02/04/2015  ? Procedure: ESOPHAGEAL DILATION;  Surgeon: SDanie Binder MD;  [facility];  Service: Endoscopy;  Laterality: N/A;  ? ESOPHAGOGASTRODUODENOSCOPY  07/01/2009  ? STXM:IWOEHOesophagus/dilation to 16 mm possible proximal cervical web  ? ESOPHAGOGASTRODUODENOSCOPY N/A 02/04/2015  ? Procedure: ESOPHAGOGASTRODUODENOSCOPY (EGD);  Surgeon: SDanie Binder MD;  [facility];  Service: Endoscopy;  Laterality: N/A;  0800-moved to 845 Office to notify pt  ? ESOPHAGOGASTRODUODENOSCOPY (EGD) WITH ESOPHAGEAL DILATION N/A 03/25/2013  ? Procedure: ESOPHAGOGASTRODUODENOSCOPY (EGD) WITH ESOPHAGEAL DILATION;  Surgeon: SDanie Binder MD;  [facility];  Service: Endoscopy;  Laterality: N/A;  ? ESOPHAGOGASTRODUODENOSCOPY (EGD) WITH PROPOFOL  12/10/2018  ? Dr. FOneida Alar moderate Schatzki ring s/p dilation, gastritis, nonbleeding duodenal diverticulum  ? POLYPECTOMY  12/10/2018  ? Procedure: POLYPECTOMY;  Surgeon: Danie Binder, MD;  [facility];  Service: Endoscopy;;  cold snare sigmoid polyp  ? SAVORY DILATION  12/10/2018  ? Procedure: SAVORY DILATION;  Surgeon: Danie Binder, MD;  [facility];  Service: Endoscopy;;  ? TONSILLECTOMY    ? TUBAL LIGATION    ? WRIST SURGERY    ? ? ?Current Outpatient  Medications  ?Medication Sig Dispense Refill  ? Ca Carbonate-Mag Hydroxide (ROLAIDS PO) Take by mouth.    ? linaCLOtide (LINZESS PO) Take 72 mcg by mouth as needed (constipation).    ? methocarbamol (ROBAXIN) 750 MG tablet Take 750 mg by mouth daily as needed for muscle spasms.    ? omeprazole (PRILOSEC) 20 MG capsule Take 20 mg by mouth as needed (acid reflux).    ? simethicone (MYLICON) 989 MG chewable tablet Chew 125 mg by mouth every 6 (six) hours as needed for flatulence.    ? polyethylene glycol-electrolytes (NULYTELY/GOLYTELY) 420 g solution TAKE 4000 MLS BY MOUTH ONCE FOR 1 DOSE (Patient not taking: Reported on 10/15/2021)    ? ?No current facility-administered medications for this visit.  ? ? ?Allergies as of 10/27/2021 - Review Complete 10/27/2021  ?Allergen Reaction Noted  ? Penicillins Anaphylaxis and Itching 07/24/2011  ? Codeine Nausea And Vomiting 09/22/2014  ? ? ?Family History  ?Problem Relation Age of Onset  ? Colon cancer Brother   ?     mid 37s, deceased  ? Colon cancer Father   ?     diagnosed at 44, deceased  ? Hypertension Father   ? Hypertension Mother   ? Hyperlipidemia Mother   ? COPD Mother   ? Cancer Mother   ?     lung  ? Heart disease Mother   ? Thyroid disease Mother   ? Irritable bowel syndrome Mother   ? Diabetes Sister   ? Irritable bowel syndrome Sister   ? COPD Brother   ? ? ?Social History  ? ?Socioeconomic History  ? Marital status: Legally Separated  ?  Spouse name: Not on file  ? Number of children: Not on file  ? Years of education: Not on file  ? Highest education level: Not on file  ?Occupational History  ? Occupation: [employer]  ?  Employer: [employer]  ?  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  ?Tobacco Use  ? Smoking status: Former  ?  Packs/day: 1.00  ?  Years: 25.00  ?  Pack years: 25.00  ?  Types: Cigarettes  ?  Quit date: 07/10/1997  ?  Years since quitting: 24.3  ? Smokeless tobacco: Never  ?Vaping Use  ? Vaping Use: Never used  ?Substance and Sexual Activity  ? Alcohol  use: No  ? Drug use: No  ? Sexual activity: Not Currently  ?  Birth control/protection: None  ?Other Topics Concern  ? Not on file  ?Social History Narrative  ? Lives alone.  CNA at [employer].    ? ?Social Determinants of Health  ? ?Financial Resource Strain: Not on file  ?Food Insecurity: Not on file  ?Transportation Needs: Not on file  ?Physical Activity: Not on file  ?Stress: Not on file  ?Social Connections: Not on file  ? ? ?Subjective: ?Review of Systems  ?Constitutional:  Negative for chills and fever.  ?HENT:  Negative for congestion and hearing loss.   ?Eyes:  Negative for blurred vision and double vision.  ?Respiratory:  Negative for  cough and shortness of breath.   ?Cardiovascular:  Negative for chest pain and palpitations.  ?Gastrointestinal:  Positive for blood in stool, diarrhea and heartburn. Negative for abdominal pain, constipation, melena and vomiting.  ?     Dysphagia  ?Genitourinary:  Negative for dysuria and urgency.  ?Musculoskeletal:  Negative for joint pain and myalgias.  ?Skin:  Negative for itching and rash.  ?Neurological:  Negative for dizziness and headaches.  ?Psychiatric/Behavioral:  Negative for depression. The patient is not nervous/anxious.   ? ? ?Objective: ?BP 118/60   Pulse 81   Temp (!) 97.3 ?F (36.3 ?C)   Ht '5\' 2"'$  (1.575 m)   Wt 150 lb 9.6 oz (68.3 kg)   BMI 27.55 kg/m?  ?Physical Exam ?Constitutional:   ?   Appearance: Normal appearance.  ?HENT:  ?   Head: Normocephalic and atraumatic.  ?Eyes:  ?   Extraocular Movements: Extraocular movements intact.  ?   Conjunctiva/sclera: Conjunctivae normal.  ?Cardiovascular:  ?   Rate and Rhythm: Normal rate and regular rhythm.  ?Pulmonary:  ?   Effort: Pulmonary effort is normal.  ?   Breath sounds: Wheezing present.  ?Abdominal:  ?   General: Bowel sounds are normal.  ?   Palpations: Abdomen is soft.  ?Musculoskeletal:     ?   General: No swelling. Normal range of motion.  ?   Cervical back: Normal range of motion and neck supple.   ?Skin: ?   General: Skin is warm and dry.  ?   Coloration: Skin is not jaundiced.  ?Neurological:  ?   General: No focal deficit present.  ?   Mental Status: She is alert and oriented to person, place, and time.  ?Psyc

## 2021-10-27 NOTE — Telephone Encounter (Signed)
Called pt and is aware of pre-op appt 

## 2021-11-03 NOTE — Patient Instructions (Signed)
? ? ? ? ? ? ? ? ? ? [name] ? 11/03/2021  ?  ? '@PREFPERIOPPHARMACY'$ @ ? ? Your procedure is scheduled on  11/07/2021. ? ? Report to [facility] at  1145  A.M. ? ? Call this number if you have problems the morning of surgery: ? [phone] ? ? Remember: ? Follow the diet and prep instructions given to you by the office. ?  ? Take these medicines the morning of surgery with A SIP OF WATER  ? ?                                 robaxin, prilosec. ?  ? Do not wear jewelry, make-up or nail polish. ? Do not wear lotions, powders, or perfumes, or deodorant. ? Do not shave 48 hours prior to surgery.  Men may shave face and neck. ? Do not bring valuables to the hospital. ? [facility] is not responsible for any belongings or valuables. ? ?Contacts, dentures or bridgework may not be worn into surgery.  Leave your suitcase in the car.  After surgery it may be brought to your room. ? ?For patients admitted to the hospital, discharge time will be determined by your treatment team. ? ?Patients discharged the day of surgery will not be allowed to drive home and must have someone with them for 24 hours.  ? ? ?Special instructions:   DO NOT smoke tobacco or vape for 24 hours before your procedure. ? ?Please read over the following fact sheets that you were given. ?Anesthesia Post-op Instructions and Care and Recovery After Surgery ?  ? ? ? Upper Endoscopy, Adult, Care After ?This sheet gives you information about how to care for yourself after your procedure. Your health care provider may also give you more specific instructions. If you have problems or questions, contact your health care provider. ?What can I expect after the procedure? ?After the procedure, it is common to have: ?A sore throat. ?Mild stomach pain or discomfort. ?Bloating. ?Nausea. ?Follow these instructions at home: ? ?Follow instructions from your health care provider about what to eat or drink after your procedure. ?Return to your normal activities as told  by your health care provider. Ask your health care provider what activities are safe for you. ?Take over-the-counter and prescription medicines only as told by your health care provider. ?If you were given a sedative during the procedure, it can affect you for several hours. Do not drive or operate machinery until your health care provider says that it is safe. ?Keep all follow-up visits as told by your health care provider. This is important. ?Contact a health care provider if you have: ?A sore throat that lasts longer than one day. ?Trouble swallowing. ?Get help right away if: ?You vomit blood or your vomit looks like coffee grounds. ?You have: ?A fever. ?Bloody, black, or tarry stools. ?A severe sore throat or you cannot swallow. ?Difficulty breathing. ?Severe pain in your chest or abdomen. ?Summary ?After the procedure, it is common to have a sore throat, mild stomach discomfort, bloating, and nausea. ?If you were given a sedative during the procedure, it can affect you for several hours. Do not drive or operate machinery until your health care provider says that it is safe. ?Follow instructions from your health care provider about what to eat or drink after your procedure. ?Return to your normal activities as told by your health care provider. ?  This information is not intended to replace advice given to you by your health care provider. Make sure you discuss any questions you have with your health care provider. ?Document Revised: 05/02/2019 Document Reviewed: 11/26/2017 ?Elsevier Patient Education ? [place]. ?Esophageal Dilatation ?Esophageal dilatation, also called esophageal dilation, is a procedure to widen or open a blocked or narrowed part of the esophagus. The esophagus is the part of the body that moves food and liquid from the mouth to the stomach. You may need this procedure if: ?You have a buildup of scar tissue in your esophagus that makes it difficult, painful, or impossible to swallow.  This can be caused by gastroesophageal reflux disease (GERD). ?You have cancer of the esophagus. ?There is a problem with how food moves through your esophagus. ?In some cases, you may need this procedure repeated at a later time to dilate the esophagus gradually. ?Tell a health care provider about: ?Any allergies you have. ?All medicines you are taking, including vitamins, herbs, eye drops, creams, and over-the-counter medicines. ?Any problems you or family members have had with anesthetic medicines. ?Any blood disorders you have. ?Any surgeries you have had. ?Any medical conditions you have. ?Any antibiotic medicines you are required to take before dental procedures. ?Whether you are pregnant or may be pregnant. ?What are the risks? ?Generally, this is a safe procedure. However, problems may occur, including: ?Bleeding due to a tear in the lining of the esophagus. ?A hole, or perforation, in the esophagus. ?What happens before the procedure? ?Ask your health care provider about: ?Changing or stopping your regular medicines. This is especially important if you are taking diabetes medicines or blood thinners. ?Taking medicines such as aspirin and ibuprofen. These medicines can thin your blood. Do not take these medicines unless your health care provider tells you to take them. ?Taking over-the-counter medicines, vitamins, herbs, and supplements. ?Follow instructions from your health care provider about eating or drinking restrictions. ?Plan to have a responsible adult take you home from the hospital or clinic. ?Plan to have a responsible adult care for you for the time you are told after you leave the hospital or clinic. This is important. ?What happens during the procedure? ?You may be given a medicine to help you relax (sedative). ?A numbing medicine may be sprayed into the back of your throat, or you may gargle the medicine. ?Your health care provider may perform the dilatation using various surgical instruments,  such as: ?Simple dilators. This instrument is carefully placed in the esophagus to stretch it. ?Guided wire bougies. This involves using an endoscope to insert a wire into the esophagus. A dilator is passed over this wire to enlarge the esophagus. Then the wire is removed. ?Balloon dilators. An endoscope with a small balloon is inserted into the esophagus. The balloon is inflated to stretch the esophagus and open it up. ?The procedure may vary among health care providers and hospitals. ?What can I expect after the procedure? ?Your blood pressure, heart rate, breathing rate, and blood oxygen level will be monitored until you leave the hospital or clinic. ?Your throat may feel slightly sore and numb. This will get better over time. ?You will not be allowed to eat or drink until your throat is no longer numb. ?When you are able to drink, urinate, and sit on the edge of the bed without nausea or dizziness, you may be able to return home. ?Follow these instructions at home: ?Take over-the-counter and prescription medicines only as told by your health  care provider. ?If you were given a sedative during the procedure, it can affect you for several hours. Do not drive or operate machinery until your health care provider says that it is safe. ?Plan to have a responsible adult care for you for the time you are told. This is important. ?Follow instructions from your health care provider about any eating or drinking restrictions. ?Do not use any products that contain nicotine or tobacco, such as cigarettes, e-cigarettes, and chewing tobacco. If you need help quitting, ask your health care provider. ?Keep all follow-up visits. This is important. ?Contact a health care provider if: ?You have a fever. ?You have pain that is not relieved by medicine. ?Get help right away if: ?You have chest pain. ?You have trouble breathing. ?You have trouble swallowing. ?You vomit blood. ?You have black, tarry, or bloody stools. ?These symptoms  may represent a serious problem that is an emergency. Do not wait to see if the symptoms will go away. Get medical help right away. Call your local emergency services (911 in the [place] Do not drive your

## 2021-11-04 ENCOUNTER — Other Ambulatory Visit: Payer: Self-pay

## 2021-11-04 ENCOUNTER — Encounter (HOSPITAL_COMMUNITY): Payer: Self-pay

## 2021-11-04 ENCOUNTER — Encounter (HOSPITAL_COMMUNITY)
Admission: RE | Admit: 2021-11-04 | Discharge: 2021-11-04 | Disposition: A | Discharge status: Home / Self Care | Payer: 59 | Source: Ambulatory Visit | Attending: Internal Medicine | Admitting: Internal Medicine

## 2021-11-04 DIAGNOSIS — Z01810 Encounter for preprocedural cardiovascular examination: Secondary | ICD-10-CM | POA: Insufficient documentation

## 2021-11-04 HISTORY — DX: Fibromyalgia: M79.7

## 2021-11-04 HISTORY — DX: Anxiety disorder, unspecified: F41.9

## 2021-11-04 HISTORY — DX: Cardiac arrhythmia, unspecified: I49.9

## 2021-11-04 NOTE — Pre-Procedure Instructions (Signed)
Attempted pre-op phone call. No voicemail. ?

## 2021-11-07 ENCOUNTER — Telehealth: Payer: Self-pay | Admitting: Internal Medicine

## 2021-11-07 ENCOUNTER — Encounter (HOSPITAL_COMMUNITY): Payer: Self-pay

## 2021-11-07 ENCOUNTER — Ambulatory Visit (HOSPITAL_COMMUNITY): Payer: 59 | Admitting: Anesthesiology

## 2021-11-07 ENCOUNTER — Encounter (HOSPITAL_COMMUNITY)
Admission: RE | Disposition: A | Discharge status: Home / Self Care | Payer: Self-pay | Source: Home / Self Care | Attending: Internal Medicine

## 2021-11-07 ENCOUNTER — Telehealth: Payer: Self-pay

## 2021-11-07 ENCOUNTER — Ambulatory Visit (HOSPITAL_COMMUNITY)
Admission: RE | Admit: 2021-11-07 | Discharge: 2021-11-07 | Disposition: A | Discharge status: Home / Self Care | Payer: 59 | Attending: Internal Medicine | Admitting: Internal Medicine

## 2021-11-07 ENCOUNTER — Ambulatory Visit (HOSPITAL_BASED_OUTPATIENT_CLINIC_OR_DEPARTMENT_OTHER): Payer: 59 | Admitting: Anesthesiology

## 2021-11-07 DIAGNOSIS — R1310 Dysphagia, unspecified: Secondary | ICD-10-CM | POA: Diagnosis not present

## 2021-11-07 DIAGNOSIS — K635 Polyp of colon: Secondary | ICD-10-CM

## 2021-11-07 DIAGNOSIS — K2980 Duodenitis without bleeding: Secondary | ICD-10-CM

## 2021-11-07 DIAGNOSIS — K2990 Gastroduodenitis, unspecified, without bleeding: Secondary | ICD-10-CM | POA: Diagnosis not present

## 2021-11-07 DIAGNOSIS — K648 Other hemorrhoids: Secondary | ICD-10-CM | POA: Insufficient documentation

## 2021-11-07 DIAGNOSIS — Z87891 Personal history of nicotine dependence: Secondary | ICD-10-CM | POA: Diagnosis not present

## 2021-11-07 DIAGNOSIS — K219 Gastro-esophageal reflux disease without esophagitis: Secondary | ICD-10-CM | POA: Diagnosis not present

## 2021-11-07 DIAGNOSIS — K222 Esophageal obstruction: Secondary | ICD-10-CM | POA: Diagnosis not present

## 2021-11-07 DIAGNOSIS — K2970 Gastritis, unspecified, without bleeding: Secondary | ICD-10-CM | POA: Diagnosis not present

## 2021-11-07 DIAGNOSIS — Z800 Family history of malignant neoplasm of digestive organs: Secondary | ICD-10-CM | POA: Diagnosis not present

## 2021-11-07 DIAGNOSIS — K5904 Chronic idiopathic constipation: Secondary | ICD-10-CM | POA: Insufficient documentation

## 2021-11-07 DIAGNOSIS — K625 Hemorrhage of anus and rectum: Secondary | ICD-10-CM

## 2021-11-07 DIAGNOSIS — Z79899 Other long term (current) drug therapy: Secondary | ICD-10-CM | POA: Insufficient documentation

## 2021-11-07 DIAGNOSIS — K449 Diaphragmatic hernia without obstruction or gangrene: Secondary | ICD-10-CM | POA: Diagnosis not present

## 2021-11-07 DIAGNOSIS — D123 Benign neoplasm of transverse colon: Secondary | ICD-10-CM | POA: Diagnosis not present

## 2021-11-07 HISTORY — PX: BALLOON DILATION: SHX5330

## 2021-11-07 HISTORY — PX: COLONOSCOPY WITH PROPOFOL: SHX5780

## 2021-11-07 HISTORY — PX: ESOPHAGOGASTRODUODENOSCOPY (EGD) WITH PROPOFOL: SHX5813

## 2021-11-07 HISTORY — PX: POLYPECTOMY: SHX5525

## 2021-11-07 HISTORY — PX: BIOPSY: SHX5522

## 2021-11-07 SURGERY — COLONOSCOPY WITH PROPOFOL
Anesthesia: General

## 2021-11-07 MED ORDER — LIDOCAINE HCL (CARDIAC) PF 100 MG/5ML IV SOSY
PREFILLED_SYRINGE | INTRAVENOUS | Status: DC | PRN
  Administered 2021-11-07: 50 mg via INTRAVENOUS

## 2021-11-07 MED ORDER — PROPOFOL 10 MG/ML IV BOLUS
INTRAVENOUS | Status: DC | PRN
  Administered 2021-11-07: 90 mg via INTRAVENOUS

## 2021-11-07 MED ORDER — LACTATED RINGERS IV SOLN
INTRAVENOUS | Status: DC
  Administered 2021-11-07: 1000 mL via INTRAVENOUS

## 2021-11-07 MED ORDER — PROPOFOL 500 MG/50ML IV EMUL
INTRAVENOUS | Status: DC | PRN
  Administered 2021-11-07: 150 ug/kg/min via INTRAVENOUS

## 2021-11-07 NOTE — Telephone Encounter (Signed)
I was able to finally get a hold of patient.  States approximately 1 hour after her procedure she had severe 10 out of 10 epigastric pain radiated to her back. ? ?She was told to go to the ER though she states she feels as though this is gas pain and is worried about cost of the ER visit. ? ?When I talked to her, her pain has improved to 6-7 out of 10.  Drinking liquids without any issues.  States her abdomen is soft.  Has taken over-the-counter gas relief medication. ? ?I counseled her that although rare, she may have perforation in her upper GI given that she was dilated today.  I recommended that she go to the ER for further evaluation. ? ?She states she will hold off for now as she is already feeling better but will go later this evening if she does not continue to improve. ?

## 2021-11-07 NOTE — Discharge Instructions (Addendum)
EGD ?Discharge instructions ?Please read the instructions outlined below and refer to this sheet in the next few weeks. These discharge instructions provide you with general information on caring for yourself after you leave the hospital. Your doctor may also give you specific instructions. While your treatment has been planned according to the most current medical practices available, unavoidable complications occasionally occur. If you have any problems or questions after discharge, please call your doctor. ?ACTIVITY ?You may resume your regular activity but move at a slower pace for the next 24 hours.  ?Take frequent rest periods for the next 24 hours.  ?Walking will help expel (get rid of) the air and reduce the bloated feeling in your abdomen.  ?No driving for 24 hours (because of the anesthesia (medicine) used during the test).  ?You may shower.  ?Do not sign any important legal documents or operate any machinery for 24 hours (because of the anesthesia used during the test).  ?NUTRITION ?Drink plenty of fluids.  ?You may resume your normal diet.  ?Begin with a light meal and progress to your normal diet.  ?Avoid alcoholic beverages for 24 hours or as instructed by your caregiver.  ?MEDICATIONS ?You may resume your normal medications unless your caregiver tells you otherwise.  ?WHAT YOU CAN EXPECT TODAY ?You may experience abdominal discomfort such as a feeling of fullness or ?gas? pains.  ?FOLLOW-UP ?Your doctor will discuss the results of your test with you.  ?SEEK IMMEDIATE MEDICAL ATTENTION IF ANY OF THE FOLLOWING OCCUR: ?Excessive nausea (feeling sick to your stomach) and/or vomiting.  ?Severe abdominal pain and distention (swelling).  ?Trouble swallowing.  ?Temperature over 101? F (37.8? C).  ?Rectal bleeding or vomiting of blood.  ? ? ? ?Colonoscopy ?Discharge Instructions ? ?Read the instructions outlined below and refer to this sheet in the next few weeks. These discharge instructions provide you with  general information on caring for yourself after you leave the hospital. Your doctor may also give you specific instructions. While your treatment has been planned according to the most current medical practices available, unavoidable complications occasionally occur.  ? ?ACTIVITY ?You may resume your regular activity, but move at a slower pace for the next 24 hours.  ?Take frequent rest periods for the next 24 hours.  ?Walking will help get rid of the air and reduce the bloated feeling in your belly (abdomen).  ?No driving for 24 hours (because of the medicine (anesthesia) used during the test).   ?Do not sign any important legal documents or operate any machinery for 24 hours (because of the anesthesia used during the test).  ?NUTRITION ?Drink plenty of fluids.  ?You may resume your normal diet as instructed by your doctor.  ?Begin with a light meal and progress to your normal diet. Heavy or fried foods are harder to digest and may make you feel sick to your stomach (nauseated).  ?Avoid alcoholic beverages for 24 hours or as instructed.  ?MEDICATIONS ?You may resume your normal medications unless your doctor tells you otherwise.  ?WHAT YOU CAN EXPECT TODAY ?Some feelings of bloating in the abdomen.  ?Passage of more gas than usual.  ?Spotting of blood in your stool or on the toilet paper.  ?IF YOU HAD POLYPS REMOVED DURING THE COLONOSCOPY: ?No aspirin products for 7 days or as instructed.  ?No alcohol for 7 days or as instructed.  ?Eat a soft diet for the next 24 hours.  ?FINDING OUT THE RESULTS OF YOUR TEST ?Not all test results are  available during your visit. If your test results are not back during the visit, make an appointment with your caregiver to find out the results. Do not assume everything is normal if you have not heard from your caregiver or the medical facility. It is important for you to follow up on all of your test results.  ?SEEK IMMEDIATE MEDICAL ATTENTION IF: ?You have more than a spotting of  blood in your stool.  ?Your belly is swollen (abdominal distention).  ?You are nauseated or vomiting.  ?You have a temperature over 101.  ?You have abdominal pain or discomfort that is severe or gets worse throughout the day.  ? ?Your EGD revealed mild amount inflammation in your stomach and small bowel.  I took biopsies of this to rule out infection with a bacteria called H. pylori.  Await pathology results, my office will contact you.  You did have a tightening of your esophagus called a Schatzki's ring.  I stressed this with the balloon today.  Hopefully this helps with your swallowing difficulties.  Continue on omeprazole 20 mg daily. ? ?Your colonoscopy revealed 4 polyp(s) which I removed successfully. Await pathology results, my office will contact you. I recommend repeating colonoscopy in 5 years for surveillance purposes.  ? ? ?Follow-up with GI in 6 months. ? ? ?I hope you have a great rest of your week! ? ?Elon Alas. Abbey Chatters, D.O. ?Gastroenterology and Hepatology ?[facility] ? ?

## 2021-11-07 NOTE — Telephone Encounter (Signed)
Pt had endoscopy today and her husband phoned panicking. He states the pt is having really bad chest pains, and pain goes down to her anal area. I advised I couldn't advise any medications that the best thing to do was going to the ED for evaluation. He states he believes she is having a allergic reaction. Again I advised the ED to be evaluated ?

## 2021-11-07 NOTE — Telephone Encounter (Signed)
Noted ? ? ?Attempted to phone the pt and it rang until it went to vm which was not set up ?

## 2021-11-07 NOTE — Anesthesia Preprocedure Evaluation (Signed)
Anesthesia Evaluation  ?Patient identified by MRN, date of birth, ID band ?Patient awake ? ? ? ?Reviewed: ?Allergy & Precautions, H&P , NPO status , Patient's Chart, lab work & pertinent test results, reviewed documented beta blocker date and time  ? ?Airway ?Mallampati: II ? ?TM Distance: >3 FB ?Neck ROM: full ? ? ? Dental ?no notable dental hx. ? ?  ?Pulmonary ?neg pulmonary ROS, former smoker,  ?  ?Pulmonary exam normal ?breath sounds clear to auscultation ? ? ? ? ? ? Cardiovascular ?Exercise Tolerance: Good ?negative cardio ROS ? ? ?Rhythm:regular Rate:Normal ? ? ?  ?Neuro/Psych ?PSYCHIATRIC DISORDERS Anxiety Depression  Neuromuscular disease   ? GI/Hepatic ?Neg liver ROS, GERD  Medicated,  ?Endo/Other  ?negative endocrine ROSdiabetes ? Renal/GU ?negative Renal ROS  ?negative genitourinary ?  ?Musculoskeletal ? ? Abdominal ?  ?Peds ? Hematology ?negative hematology ROS ?(+)   ?Anesthesia Other Findings ? ? Reproductive/Obstetrics ?negative OB ROS ? ?  ? ? ? ? ? ? ? ? ? ? ? ? ? ?  ?  ? ? ? ? ? ? ? ? ?Anesthesia Physical ?Anesthesia Plan ? ?ASA: 2 ? ?Anesthesia Plan: General  ? ?Post-op Pain Management:   ? ?Induction:  ? ?PONV Risk Score and Plan: Propofol infusion ? ?Airway Management Planned:  ? ?Additional Equipment:  ? ?Intra-op Plan:  ? ?Post-operative Plan:  ? ?Informed Consent: I have reviewed the patients History and Physical, chart, labs and discussed the procedure including the risks, benefits and alternatives for the proposed anesthesia with the patient or authorized representative who has indicated his/her understanding and acceptance.  ? ? ? ?Dental Advisory Given ? ?Plan Discussed with: CRNA ? ?Anesthesia Plan Comments:   ? ? ? ? ? ? ?Anesthesia Quick Evaluation ? ?

## 2021-11-07 NOTE — Interval H&P Note (Signed)
History and Physical Interval Note: ? ?11/07/2021 ?12:59 PM ? ?[name]  has presented today for surgery, with the diagnosis of dysphagia, rectal bleeding.  The various methods of treatment have been discussed with the patient and family. After consideration of risks, benefits and other options for treatment, the patient has consented to  Procedure(s) with comments: ?COLONOSCOPY WITH PROPOFOL (N/A) - 1:30pm ?ESOPHAGOGASTRODUODENOSCOPY (EGD) WITH PROPOFOL (N/A) ?BALLOON DILATION (N/A) as a surgical intervention.  The patient's history has been reviewed, patient examined, no change in status, stable for surgery.  I have reviewed the patient's chart and labs.  Questions were answered to the patient's satisfaction.   ? ? ?[name] ? ? ?

## 2021-11-07 NOTE — Op Note (Signed)
[facility] ?Patient Name: [name] ?Procedure Date: 11/07/2021 1:02 PM ?MRN: [id number] ?Date of Birth: 1960/10/31 ?Attending MD: Elon Alas. Abbey Chatters , DO ?CSN: [id number] ?Age: 61 ?Admit Type: Outpatient ?Procedure:                Upper GI endoscopy ?Indications:              Dysphagia ?Providers:                Elon Alas. Abbey Chatters, DO, Tammy Vaught, RN, Crisann  ?                          Wynonia Lawman, Technician ?Referring MD:              ?Medicines:                See the Anesthesia note for documentation of the  ?                          administered medications ?Complications:            No immediate complications. ?Estimated Blood Loss:     Estimated blood loss was minimal. ?Procedure:                Pre-Anesthesia Assessment: ?                          - The anesthesia plan was to use monitored  ?                          anesthesia care (MAC). ?                          After obtaining informed consent, the endoscope was  ?                          passed under direct vision. Throughout the  ?                          procedure, the patient's blood pressure, pulse, and  ?                          oxygen saturations were monitored continuously. The  ?                          GIF-H190 ([id number]) scope was introduced through the  ?                          mouth, and advanced to the second part of duodenum.  ?                          The upper GI endoscopy was accomplished without  ?                          difficulty. The patient tolerated the procedure  ?                          well. ?Scope In: 1:12:30 PM ?Scope Out:  1:19:48 PM ?Total Procedure Duration: 0 hours 7 minutes 18 seconds  ?Findings: ?     A 2 cm hiatal hernia was present. ?     A mild Schatzki ring was found in the distal esophagus. A TTS dilator  ?     was passed through the scope. Dilation with an 18-19-20 mm balloon  ?     dilator was performed to 18 mm. The dilation site was examined and  ?     showed mild mucosal disruption and  moderate improvement in luminal  ?     narrowing. ?     Patchy mild inflammation characterized by erythema was found in the  ?     entire examined stomach. Biopsies were taken with a cold forceps for  ?     Helicobacter pylori testing. ?     Scattered mild inflammation characterized by erythema was found in the  ?     duodenal bulb and in the first portion of the duodenum. Biopsies were  ?     taken with a cold forceps for histology. ?Impression:               - 2 cm hiatal hernia. ?                          - Mild Schatzki ring. Dilated. ?                          - Gastritis. Biopsied. ?                          - Duodenitis. Biopsied. ?Moderate Sedation: ?     Per Anesthesia Care ?Recommendation:           - Patient has a contact number available for  ?                          emergencies. The signs and symptoms of potential  ?                          delayed complications were discussed with the  ?                          patient. Return to normal activities tomorrow.  ?                          Written discharge instructions were provided to the  ?                          patient. ?                          - Resume previous diet. ?                          - Continue present medications. ?                          - Await pathology results. ?                          -  Repeat upper endoscopy PRN for retreatment. ?                          - Return to GI clinic in 6 months. ?                          - Use Prilosec (omeprazole) 20 mg PO daily. ?Procedure Code(s):        --- Professional --- ?                          [phone], Esophagogastroduodenoscopy, flexible,  ?                          transoral; with transendoscopic balloon dilation of  ?                          esophagus (less than 30 mm diameter) ?                          43239, 59, Esophagogastroduodenoscopy, flexible,  ?                          transoral; with biopsy, single or multiple ?Diagnosis Code(s):        --- Professional --- ?                           K44.9, Diaphragmatic hernia without obstruction or  ?                          gangrene ?                          K22.2, Esophageal obstruction ?                          K29.70, Gastritis, unspecified, without bleeding ?                          K29.80, Duodenitis without bleeding ?                          R13.10, Dysphagia, unspecified ?CPT copyright 2019 American [employer] Association. All rights reserved. ?The codes documented in this report are preliminary and upon coder review may  ?be revised to meet current compliance requirements. ?Elon Alas. Abbey Chatters, DO ?Elon Alas. [place], DO ?11/07/2021 1:23:36 PM ?This report has been signed electronically. ?Number of Addenda: 0 ?

## 2021-11-07 NOTE — Telephone Encounter (Signed)
Attempted to call patient 3 times.  Went to [employer] all 3 times.  Voicemail is not set up to leave messages.  She underwent EGD and colonoscopy today.   ? ?Colonoscopy was largely benign besides 4 small polyps removed. ? ?EGD with gastritis and duodenitis with biopsies.  She did have a Schatzki's ring which I dilated with balloon today. ? ?If she is having that much pain then I  agree that she go to the ER immediately for evaluation. ?

## 2021-11-07 NOTE — Transfer of Care (Signed)
Immediate Anesthesia Transfer of Care Note ? ?Patient: DENIKA KRONE ? ?Procedure(s) Performed: COLONOSCOPY WITH PROPOFOL ?ESOPHAGOGASTRODUODENOSCOPY (EGD) WITH PROPOFOL ?BALLOON DILATION ?BIOPSY ?POLYPECTOMY ? ?Patient Location: Short Stay ? ?Anesthesia Type:General ? ?Level of Consciousness: awake and alert  ? ?Airway & Oxygen Therapy: Patient Spontanous Breathing ? ?Post-op Assessment: Report given to RN and Post -op Vital signs reviewed and stable ? ?Post vital signs: Reviewed and stable ? ?Last Vitals:  ?Vitals Value Taken Time  ?BP 115/60 11/07/21 1339  ?Temp 36.7 ?C 11/07/21 1339  ?Pulse 86 11/07/21 1339  ?Resp 13 11/07/21 1339  ?SpO2 100 % 11/07/21 1339  ? ? ?Last Pain:  ?Vitals:  ? 11/07/21 1339  ?TempSrc: Oral  ?PainSc: 0-No pain  ?   ? ?Patients Stated Pain Goal: 5 (11/07/21 1254) ? ?Complications: No notable events documented. ?

## 2021-11-07 NOTE — Op Note (Signed)
[facility] ?Patient Name: [name] ?Procedure Date: 11/07/2021 1:21 PM ?MRN: [id number] ?Date of Birth: September 05, 1960 ?Attending MD: Elon Alas. Abbey Chatters , DO ?CSN: [id number] ?Age: 61 ?Admit Type: Outpatient ?Procedure:                Colonoscopy ?Indications:              Rectal bleeding ?Providers:                Elon Alas. Abbey Chatters, DO, Tammy Vaught, RN, Crisann  ?                          Wynonia Lawman, Technician ?Referring MD:              ?Medicines:                See the Anesthesia note for documentation of the  ?                          administered medications ?Complications:            No immediate complications. ?Estimated Blood Loss:     Estimated blood loss was minimal. ?Procedure:                Pre-Anesthesia Assessment: ?                          - The anesthesia plan was to use monitored  ?                          anesthesia care (MAC). ?                          After obtaining informed consent, the colonoscope  ?                          was passed under direct vision. Throughout the  ?                          procedure, the patient's blood pressure, pulse, and  ?                          oxygen saturations were monitored continuously. The  ?                          PCF-HQ190L ([id number]) scope was introduced through  ?                          the anus and advanced to the the cecum, identified  ?                          by appendiceal orifice and ileocecal valve. The  ?                          colonoscopy was performed without difficulty. The  ?                          patient tolerated the procedure well. The quality  ?  of the bowel preparation was evaluated using the  ?                          BBPS [facility] Bowel Preparation Scale) with scores  ?                          of: Right Colon = 3, Transverse Colon = 3 and Left  ?                          Colon = 3 (entire mucosa seen well with no residual  ?                          staining, small fragments of stool or  opaque  ?                          liquid). The total BBPS score equals 9. ?Scope In: 1:24:13 PM ?Scope Out: 1:34:59 PM ?Scope Withdrawal Time: 0 hours 9 minutes 46 seconds  ?Total Procedure Duration: 0 hours 10 minutes 46 seconds  ?Findings: ?     Hemorrhoids were found on perianal exam. ?     Non-bleeding internal hemorrhoids were found during endoscopy. ?     Four sessile polyps were found in the transverse colon. The polyps were  ?     1 to 2 mm in size. These polyps were removed with a cold biopsy forceps.  ?     Resection and retrieval were complete. ?     The exam was otherwise without abnormality. ?Impression:               - Hemorrhoids found on perianal exam. ?                          - Non-bleeding internal hemorrhoids. ?                          - Four 1 to 2 mm polyps in the transverse colon,  ?                          removed with a cold biopsy forceps. Resected and  ?                          retrieved. ?                          - The examination was otherwise normal. ?Moderate Sedation: ?     Per Anesthesia Care ?Recommendation:           - Patient has a contact number available for  ?                          emergencies. The signs and symptoms of potential  ?                          delayed complications were discussed with the  ?  patient. Return to normal activities tomorrow.  ?                          Written discharge instructions were provided to the  ?                          patient. ?                          - Resume previous diet. ?                          - Continue present medications. ?                          - Await pathology results. ?                          - Repeat colonoscopy in 5-10 years for surveillance. ?                          - Return to GI clinic in 6 months. ?Procedure Code(s):        --- Professional --- ?                          [phone], Colonoscopy, flexible; with biopsy, single  ?                          or multiple ?Diagnosis  Code(s):        --- Professional --- ?                          V61.6, Other hemorrhoids ?                          K63.5, Polyp of colon ?                          K62.5, Hemorrhage of anus and rectum ?CPT copyright 2019 American Medical Association. All rights reserved. ?The codes documented in this report are preliminary and upon coder review may  ?be revised to meet current compliance requirements. ?Elon Alas. Abbey Chatters, DO ?Elon Alas. Abbey Chatters, DO ?11/07/2021 1:38:14 PM ?This report has been signed electronically. ?Number of Addenda: 0 ?

## 2021-11-08 LAB — SURGICAL PATHOLOGY

## 2021-11-08 NOTE — Telephone Encounter (Signed)
noted 

## 2021-11-08 NOTE — Telephone Encounter (Signed)
Raven Harman, DO ?  ?CC ?   7:14 PM ?Note ?I was able to finally get a hold of patient.  States approximately 1 hour after her procedure she had severe 10 out of 10 epigastric pain radiated to her back. ?  ?She was told to go to the ER though she states she feels as though this is gas pain and is worried about cost of the ER visit. ?  ?When I talked to her, her pain has improved to 6-7 out of 10.  Drinking liquids without any issues.  States her abdomen is soft.  Has taken over-the-counter gas relief medication. ?  ?I counseled her that although rare, she may have perforation in her upper GI given that she was dilated today.  I recommended that she go to the ER for further evaluation. ?  ?She states she will hold off for now as she is already feeling better but will go later this evening if she does not continue to improve.  ?  ? ?

## 2021-11-08 NOTE — Anesthesia Postprocedure Evaluation (Signed)
Anesthesia Post Note ? ?Patient: LIDIE GLADE ? ?Procedure(s) Performed: COLONOSCOPY WITH PROPOFOL ?ESOPHAGOGASTRODUODENOSCOPY (EGD) WITH PROPOFOL ?BALLOON DILATION ?BIOPSY ?POLYPECTOMY ? ?Patient location during evaluation: Phase II ?Anesthesia Type: General ?Level of consciousness: awake ?Pain management: pain level controlled ?Vital Signs Assessment: post-procedure vital signs reviewed and stable ?Respiratory status: spontaneous breathing and respiratory function stable ?Cardiovascular status: blood pressure returned to baseline and stable ?Postop Assessment: no headache and no apparent nausea or vomiting ?Anesthetic complications: no ?Comments: Late entry ? ? ?No notable events documented. ? ? ?Last Vitals:  ?Vitals:  ? 11/07/21 1254 11/07/21 1339  ?BP: 131/80 115/60  ?Pulse: 95 86  ?Resp: 16 13  ?Temp: 37 ?C 36.7 ?C  ?SpO2: 100% 100%  ?  ?Last Pain:  ?Vitals:  ? 11/07/21 1339  ?TempSrc: Oral  ?PainSc: 0-No pain  ? ? ?  ?  ?  ?  ?  ?  ? ?Louann Sjogren ? ? ? ? ?

## 2021-11-14 ENCOUNTER — Encounter (HOSPITAL_COMMUNITY): Payer: Self-pay | Admitting: Internal Medicine

## 2021-11-18 ENCOUNTER — Other Ambulatory Visit: Payer: Self-pay

## 2021-11-18 ENCOUNTER — Encounter: Payer: Self-pay | Admitting: Emergency Medicine

## 2021-11-18 ENCOUNTER — Ambulatory Visit
Admission: EM | Admit: 2021-11-18 | Discharge: 2021-11-18 | Disposition: A | Discharge status: Home / Self Care | Payer: 59 | Attending: Emergency Medicine | Admitting: Emergency Medicine

## 2021-11-18 DIAGNOSIS — H66001 Acute suppurative otitis media without spontaneous rupture of ear drum, right ear: Secondary | ICD-10-CM | POA: Diagnosis not present

## 2021-11-18 DIAGNOSIS — J0140 Acute pansinusitis, unspecified: Secondary | ICD-10-CM

## 2021-11-18 MED ORDER — CEFDINIR 300 MG PO CAPS: 300.0000 mg | ORAL_CAPSULE | Freq: Two times a day (BID) | ORAL | 0 refills | Status: DC

## 2021-11-18 MED ORDER — FLUTICASONE PROPIONATE 50 MCG/ACT NA SUSP: 2.0000 | Freq: Every day | NASAL | 0 refills | Status: DC

## 2021-11-18 MED ORDER — PROMETHAZINE-DM 6.25-15 MG/5ML PO SYRP: 5.0000 mL | ORAL_SOLUTION | Freq: Four times a day (QID) | ORAL | 0 refills | Status: DC | PRN

## 2021-11-18 NOTE — Discharge Instructions (Addendum)
We are going to start treating your ear infection and sinus infection.  This will also cover a sore throat And may also cover pneumonia .  Finish the [place], even if you feel better.  May take 400 mg of ibuprofen combined with 1000 mg of Tylenol together 3-4 times a day as needed for pain.  Saline nasal irrigation with a Milta Deiters Med rinse and distilled water as often as you want, Flonase.  If you are not getting better in 3 days, return here or follow-up with your doctor for chest x-ray.  we can always add azithromycin to cover pneumonia. ?

## 2021-11-18 NOTE — ED Triage Notes (Addendum)
Pt reports intermittent fever, productive cough, nasal congestion,headache,neck pain x5 days. Pt reports bilateral ear "fullness" since last night. Has tried otc medication with no change in symptoms. ?

## 2021-11-18 NOTE — ED Provider Notes (Addendum)
HPI ? ?SUBJECTIVE: ? ?[name] is a 61 y.o. female who presents with 5 to 6 days of fevers Tmax 101, sore throat, nasal congestion, clear rhinorrhea, cough productive of green mucus.  She reports decreased hearing in her right ear, tinnitus, occasional vertigo, right ear pain starting yesterday.  No otorrhea.  She reports sinus pain and pressure, postnasal drip.  No body aches, wheezing, chest pain, shortness of breath, nausea, vomiting, diarrhea, abdominal pain.  No known COVID or flu exposure.  She got 2 doses of the COVID-vaccine and this years flu vaccine.  No antibiotics in the past 3 months.  She took Tylenol within 6 hours of evaluation.  She has been alternating Tylenol and ibuprofen, TheraFlu, Alka-Seltzer cold and flu and states these are no longer working.  Symptoms are worse when she is upright.  She has a past medical history of horseshoe kidney, IBS, GERD, hiatal hernia, and an arrhythmia.  No history of pulmonary disease, diabetes, hypertension. ? ?Past Medical History:  ?Diagnosis Date  ? Anxiety   ? DDD (degenerative disc disease)   ? spinal injections  ? Diabetes mellitus without complication ([place])   ? Diverticular disease   ? Dysrhythmia   ? Fibromyalgia   ? GERD (gastroesophageal reflux disease)   ? Horseshoe kidney   ? Hypercholesterolemia   ? Irritable bowel syndrome   ? ? ?Past Surgical History:  ?Procedure Laterality Date  ? BALLOON DILATION N/A 11/07/2021  ? Procedure: BALLOON DILATION;  Surgeon: [name], DO;  [facility];  Service: Endoscopy;  Laterality: N/A;  ? BIOPSY  11/07/2021  ? Procedure: BIOPSY;  Surgeon: [name], DO;  [facility];  Service: Endoscopy;;  ? cervical dysplasia    ? COLONOSCOPY  07/01/2009  ? [id number] seen in the ascending and sigmoid colon/small internal hemorrhoids/6-mm sessile polyp/4-mm sessile polyp.3-mm sessile polyp removed. simple adenomas  ? COLONOSCOPY N/A 03/25/2013  ? Procedure: COLONOSCOPY;  Surgeon:  Danie Binder, MD;  [facility];  Service: Endoscopy;  Laterality: N/A;  10:15  ? COLONOSCOPY WITH PROPOFOL N/A 12/10/2018  ? Dr. Oneida Alar: 75m serrated colon polyp, normal TI, hemorrhoids, diverticulosis. next TCS in five years.   ? COLONOSCOPY WITH PROPOFOL N/A 11/07/2021  ? Procedure: COLONOSCOPY WITH PROPOFOL;  Surgeon: CEloise Harman DO;  [facility];  Service: Endoscopy;  Laterality: N/A;  1:30pm  ? ESOPHAGEAL DILATION N/A 02/04/2015  ? Procedure: ESOPHAGEAL DILATION;  Surgeon: SDanie Binder MD;  [facility];  Service: Endoscopy;  Laterality: N/A;  ? ESOPHAGOGASTRODUODENOSCOPY  07/01/2009  ? [id number]/dilation to 16 mm possible proximal cervical web  ? ESOPHAGOGASTRODUODENOSCOPY N/A 02/04/2015  ? Procedure: ESOPHAGOGASTRODUODENOSCOPY (EGD);  Surgeon: SDanie Binder MD;  [facility];  Service: Endoscopy;  Laterality: N/A;  0800-moved to 845 Office to notify pt  ? ESOPHAGOGASTRODUODENOSCOPY (EGD) WITH ESOPHAGEAL DILATION N/A 03/25/2013  ? Procedure: ESOPHAGOGASTRODUODENOSCOPY (EGD) WITH ESOPHAGEAL DILATION;  Surgeon: SDanie Binder MD;  [facility];  Service: Endoscopy;  Laterality: N/A;  ? ESOPHAGOGASTRODUODENOSCOPY (EGD) WITH PROPOFOL  12/10/2018  ? Dr. FOneida Alar moderate Schatzki ring s/p dilation, gastritis, nonbleeding duodenal diverticulum  ? ESOPHAGOGASTRODUODENOSCOPY (EGD) WITH PROPOFOL N/A 11/07/2021  ? Procedure: ESOPHAGOGASTRODUODENOSCOPY (EGD) WITH PROPOFOL;  Surgeon: CEloise Harman DO;  [facility];  Service: Endoscopy;  Laterality: N/A;  ? herniated disc    ? POLYPECTOMY  12/10/2018  ? Procedure: POLYPECTOMY;  Surgeon: FDanie Binder MD;  [facility];  Service: Endoscopy;;  cold snare sigmoid polyp  ? POLYPECTOMY  11/07/2021  ? Procedure: POLYPECTOMY;  Surgeon: [name], DO;  [facility];  Service: Endoscopy;;  ? SAVORY DILATION  12/10/2018  ? Procedure: SAVORY DILATION;   Surgeon: Danie Binder, MD;  [facility];  Service: Endoscopy;;  ? TONSILLECTOMY    ? TUBAL LIGATION    ? WRIST SURGERY    ? ? ?Family History  ?Problem Relation Age of Onset  ? Colon cancer Brother   ?     mid 20s, deceased  ? Colon cancer Father   ?     diagnosed at 38, deceased  ? Hypertension Father   ? Hypertension Mother   ? Hyperlipidemia Mother   ? COPD Mother   ? Cancer Mother   ?     lung  ? Heart disease Mother   ? Thyroid disease Mother   ? Irritable bowel syndrome Mother   ? Diabetes Sister   ? Irritable bowel syndrome Sister   ? COPD Brother   ? ? ?Social History  ? ?Tobacco Use  ? Smoking status: Former  ?  Packs/day: 1.00  ?  Years: 25.00  ?  Pack years: 25.00  ?  Types: Cigarettes  ?  Quit date: 07/10/1997  ?  Years since quitting: 24.3  ? Smokeless tobacco: Never  ?Vaping Use  ? Vaping Use: Never used  ?Substance Use Topics  ? Alcohol use: No  ? Drug use: No  ? ? ?No current facility-administered medications for this encounter. ? ?Current Outpatient Medications:  ?  cefdinir (OMNICEF) 300 MG capsule, Take 1 capsule (300 mg total) by mouth 2 (two) times daily., Disp: 20 capsule, Rfl: 0 ?  fluticasone (FLONASE) 50 MCG/ACT nasal spray, Place 2 sprays into both nostrils daily., Disp: 16 g, Rfl: 0 ?  promethazine-dextromethorphan (PROMETHAZINE-DM) 6.25-15 MG/5ML syrup, Take 5 mLs by mouth 4 (four) times daily as needed for cough., Disp: 118 mL, Rfl: 0 ?  Ca Carbonate-Mag Hydroxide (ROLAIDS PO), Take by mouth., Disp: , Rfl:  ?  linaclotide (LINZESS) 145 MCG CAPS capsule, Take 1 capsule (145 mcg total) by mouth daily before breakfast., Disp: 90 capsule, Rfl: 1 ?  methocarbamol (ROBAXIN) 750 MG tablet, Take 750 mg by mouth daily as needed for muscle spasms., Disp: , Rfl:  ?  omeprazole (PRILOSEC) 20 MG capsule, Take 20 mg by mouth as needed (acid reflux)., Disp: , Rfl:  ?  simethicone (MYLICON) 403 MG chewable tablet, Chew 125 mg by mouth every 6 (six) hours as needed for flatulence., Disp: ,  Rfl:  ? ?Allergies  ?Allergen Reactions  ? Penicillins Anaphylaxis and Itching  ?  Has patient had a PCN reaction causing immediate rash, facial/tongue/throat swelling, SOB or lightheadedness with hypotension: Yes ?Has patient had a PCN reaction causing severe rash involving mucus membranes or skin necrosis: No ?Has patient had a PCN reaction that required hospitalization Yes ?Has patient had a PCN reaction occurring within the last 10 years: Yes ?If all of the above answers are "NO", then may proceed with Cephalosporin use. ?  ? Codeine Nausea And Vomiting  ? ? ? ?ROS ? ?As noted in HPI.  ? ?Physical Exam ? ?BP 128/86 (BP Location: Right Arm)   Pulse 85   Temp 98.8 ?F (37.1 ?C) (Oral)   Resp 16   Ht '5\' 2"'$  (1.575 m)   Wt 67.6 kg   SpO2 98%  BMI 27.25 kg/m?  ? ?Constitutional: Well developed, well nourished, no acute distress ?Eyes:  EOMI, conjunctiva normal bilaterally ?HENT: Normocephalic, atraumatic,mucus membranes moist.Right TM dull, erythematous, bulging with frank pus behind the TM.  Decreased hearing right ear.  Positive right TMJ tenderness.  Positive nasal congestion.  Normal turbinates.  Positive maxillary, frontal sinus tenderness.  Normal oropharynx, tonsils normal without exudates.  Negative postnasal drip. ?Neck: No cervical lymphadenopathy ?Respiratory: Normal inspiratory effort, lungs clear bilaterally ?Cardiovascular: Normal rate, regular rhythm, no murmurs rubs or gallops ?GI: nondistended ?skin: No rash, skin intact ?Musculoskeletal: no deformities ?Neurologic: Alert & oriented x 3, no focal neuro deficits ?Psychiatric: Speech and behavior appropriate ? ? ?ED Course ? ? ?Medications - No data to display ? ?No orders of the defined types were placed in this encounter. ? ? ?No results found for this or any previous visit (from the past 24 hour(s)). ?No results found. ? ?ED Clinical Impression ? ?1. Non-recurrent acute suppurative otitis media of right ear without spontaneous rupture of  tympanic membrane   ?2. Acute non-recurrent pansinusitis   ?  ? ?ED Assessment/Plan ? ?COVID testing not done as she is out of the treatment window.  ? ?Patient with a right-sided purulent otitis media.  Also concern for

## 2022-01-11 ENCOUNTER — Other Ambulatory Visit (HOSPITAL_COMMUNITY): Payer: Self-pay | Admitting: Obstetrics and Gynecology

## 2022-01-11 ENCOUNTER — Encounter: Payer: Self-pay | Admitting: Internal Medicine

## 2022-01-11 DIAGNOSIS — M545 Low back pain, unspecified: Secondary | ICD-10-CM

## 2022-01-12 ENCOUNTER — Ambulatory Visit (HOSPITAL_COMMUNITY)
Admission: RE | Admit: 2022-01-12 | Discharge: 2022-01-12 | Disposition: A | Discharge status: Home / Self Care | Payer: 59 | Source: Ambulatory Visit | Attending: Obstetrics and Gynecology | Admitting: Obstetrics and Gynecology

## 2022-01-12 DIAGNOSIS — M545 Low back pain, unspecified: Secondary | ICD-10-CM | POA: Diagnosis present

## 2022-01-30 ENCOUNTER — Other Ambulatory Visit: Payer: Self-pay | Admitting: Nurse Practitioner

## 2022-01-30 ENCOUNTER — Other Ambulatory Visit (HOSPITAL_COMMUNITY): Payer: Self-pay | Admitting: Nurse Practitioner

## 2022-01-30 DIAGNOSIS — M545 Low back pain, unspecified: Secondary | ICD-10-CM

## 2022-02-01 ENCOUNTER — Other Ambulatory Visit (HOSPITAL_COMMUNITY): Payer: Self-pay | Admitting: Nurse Practitioner

## 2022-02-01 DIAGNOSIS — Z1231 Encounter for screening mammogram for malignant neoplasm of breast: Secondary | ICD-10-CM

## 2022-02-22 ENCOUNTER — Other Ambulatory Visit (HOSPITAL_COMMUNITY): Payer: Self-pay | Admitting: Nurse Practitioner

## 2022-02-22 DIAGNOSIS — M545 Low back pain, unspecified: Secondary | ICD-10-CM

## 2022-02-28 ENCOUNTER — Encounter: Payer: Self-pay | Admitting: Gastroenterology

## 2022-02-28 ENCOUNTER — Ambulatory Visit (INDEPENDENT_AMBULATORY_CARE_PROVIDER_SITE_OTHER): Payer: 59 | Admitting: Gastroenterology

## 2022-02-28 ENCOUNTER — Telehealth: Payer: Self-pay | Admitting: *Deleted

## 2022-02-28 ENCOUNTER — Other Ambulatory Visit: Payer: Self-pay | Admitting: *Deleted

## 2022-02-28 VITALS — BP 125/83 | HR 103 | Temp 97.7°F | Ht 61.0 in | Wt 156.6 lb

## 2022-02-28 DIAGNOSIS — K581 Irritable bowel syndrome with constipation: Secondary | ICD-10-CM | POA: Diagnosis not present

## 2022-02-28 DIAGNOSIS — K642 Third degree hemorrhoids: Secondary | ICD-10-CM | POA: Diagnosis not present

## 2022-02-28 DIAGNOSIS — R1310 Dysphagia, unspecified: Secondary | ICD-10-CM | POA: Diagnosis not present

## 2022-02-28 DIAGNOSIS — K219 Gastro-esophageal reflux disease without esophagitis: Secondary | ICD-10-CM | POA: Diagnosis not present

## 2022-02-28 NOTE — Progress Notes (Signed)
GI Office Note    Referring Provider: Leslie Andrea, MD Primary Care Physician:  Leslie Andrea, MD Primary Gastroenterologist: Dr. Abbey Chatters  Date:  02/28/2022  ID:  [name], DOB 1960/11/01, MRN [id number]   Chief Complaint   Chief Complaint  Patient presents with   Hemorrhoids    Hemorrhoids are bleeding a lot. They bleed when she has a BM and also when she doesn't.     History of Present Illness  [name] is a 61 y.o. female with a history of anxiety, diabetes, degenerative disk disease, fibromyalgia, GERD, HLD, and IBS presenting today for follow up.  Last seen in clinic 10/27/2021: She was seen for follow-up regarding chronic idiopathic constipation.  She was previously maintained on Linzess 72 mcg daily.  She has a family history of colon cancer in her brother and her father.  2 weeks prior to her appointment she reported sudden onset severe abdominal pain, nausea, vomiting, and diarrhea and had lab work and CT that were unremarkable.  She reported taking metronidazole for 5 days and feeling better but still was having 1-2 loose bowel movements daily.  She also noted diffuse rectal bleeding for 2 weeks filling up the toilet bowl with blood.  Chronic GERD, taking omeprazole 20 mg 2-3 times daily and noted chronic esophageal dysphagia.  Reported she was being choked on food regularly again and requested EGD with dilation.  She was given prescription for Linzess 145 mcg daily to begin taking after her diarrhea resolved.  Last EGD and colonoscopy 11/07/2021.  EGD with 2 cm hiatal hernia, mild Schatzki's ring s/p dilation, gastritis and duodenitis with benign biopsies, negative for H. Pylori.  Colonoscopy with hemorrhoids on perianal exam, nonbleeding internal hemorrhoids, four 1-2 millimeter polyps in the transverse colon. Transverse colon polyp biopsy confirming 2 fragments of tubular adenoma, 3 fragments of benign mucosa.  Recommend repeat in 5 years.  Today: Reports  2 large hemorrhoids, sometimes they go in by themselves and sometimes she has to reduce them. She keeps having bleeding but no pain or itching currently. She reports sometimes spending an hour on the commode.  Taking the Linzess 145 mcg but not daily due to cost. Takes dulcolax at home and has tried enemas as well to help with management (fleet) and suppositories.   Still getting chocked on pills. Pills sit at the base of her neck. Has to take them with applesauce.   Sometimes takes the omeprazole twice daily. Has an over the counter antacid she uses as well for breatkhrough   Current Outpatient Medications  Medication Sig Dispense Refill   atorvastatin (LIPITOR) 10 MG tablet Take 10 mg by mouth daily.     Ca Carbonate-Mag Hydroxide (ROLAIDS PO) Take by mouth.     cefdinir (OMNICEF) 300 MG capsule Take 1 capsule (300 mg total) by mouth 2 (two) times daily. 20 capsule 0   cyclobenzaprine (FLEXERIL) 10 MG tablet SMARTSIG:1 Tablet(s) By Mouth 1-3 Times Daily     FEROSUL 325 (65 Fe) MG tablet Take 325 mg by mouth daily.     furosemide (LASIX) 40 MG tablet Take 40 mg by mouth daily as needed.     gabapentin (NEURONTIN) 100 MG capsule Take 100 mg by mouth 3 (three) times daily.     gabapentin (NEURONTIN) 300 MG capsule Take 300 mg by mouth 3 (three) times daily.     linaclotide (LINZESS) 145 MCG CAPS capsule Take 1 capsule (145 mcg total) by mouth daily before breakfast. 90 capsule  1   metFORMIN (GLUCOPHAGE-XR) 500 MG 24 hr tablet Take 500 mg by mouth daily.     nitroGLYCERIN (NITROSTAT) 0.4 MG SL tablet Place under the tongue.     omeprazole (PRILOSEC) 20 MG capsule Take 20 mg by mouth as needed (acid reflux).     Potassium Chloride ER 20 MEQ TBCR Take 1 tablet by mouth daily.     promethazine-dextromethorphan (PROMETHAZINE-DM) 6.25-15 MG/5ML syrup Take 5 mLs by mouth 4 (four) times daily as needed for cough. 118 mL 0   Rimegepant Sulfate (NURTEC) 75 MG TBDP Take 75 mg by mouth once.      simethicone (MYLICON) 885 MG chewable tablet Chew 125 mg by mouth every 6 (six) hours as needed for flatulence.     fluticasone (FLONASE) 50 MCG/ACT nasal spray Place 2 sprays into both nostrils daily. (Patient not taking: Reported on 02/28/2022) 16 g 0   methocarbamol (ROBAXIN) 750 MG tablet Take 750 mg by mouth daily as needed for muscle spasms. (Patient not taking: Reported on 02/28/2022)     No current facility-administered medications for this visit.    Past Medical History:  Diagnosis Date   Anxiety    DDD (degenerative disc disease)    spinal injections   Diabetes mellitus without complication ([place])    Diverticular disease    Dysrhythmia    Fibromyalgia    GERD (gastroesophageal reflux disease)    Horseshoe kidney    Hypercholesterolemia    Irritable bowel syndrome     Past Surgical History:  Procedure Laterality Date   BALLOON DILATION N/A 11/07/2021   Procedure: BALLOON DILATION;  Surgeon: [name], DO;  [facility];  Service: Endoscopy;  Laterality: N/A;   BIOPSY  11/07/2021   Procedure: BIOPSY;  Surgeon: [name], DO;  [facility];  Service: Endoscopy;;   cervical dysplasia     COLONOSCOPY  07/01/2009   OYD:[id number] seen in the ascending and sigmoid colon/small internal hemorrhoids/6-mm sessile polyp/4-mm sessile polyp.3-mm sessile polyp removed. simple adenomas   COLONOSCOPY N/A 03/25/2013   Procedure: COLONOSCOPY;  Surgeon: Danie Binder, MD;  [facility];  Service: Endoscopy;  Laterality: N/A;  10:15   COLONOSCOPY WITH PROPOFOL N/A 12/10/2018   Dr. Oneida Alar: 75m serrated colon polyp, normal TI, hemorrhoids, diverticulosis. next TCS in five years.    COLONOSCOPY WITH PROPOFOL N/A 11/07/2021   Procedure: COLONOSCOPY WITH PROPOFOL;  Surgeon: CEloise Harman DO;  [facility];  Service: Endoscopy;  Laterality: N/A;  1:30pm   ESOPHAGEAL DILATION N/A 02/04/2015   Procedure: ESOPHAGEAL DILATION;  Surgeon: SDanie Binder MD;  [facility];  Service: Endoscopy;  Laterality: N/A;   ESOPHAGOGASTRODUODENOSCOPY  07/01/2009   SMCN:[id number]/dilation to 16 mm possible proximal cervical web   ESOPHAGOGASTRODUODENOSCOPY N/A 02/04/2015   Procedure: ESOPHAGOGASTRODUODENOSCOPY (EGD);  Surgeon: SDanie Binder MD;  [facility];  Service: Endoscopy;  Laterality: N/A;  0800-moved to 845 Office to notify pt   ESOPHAGOGASTRODUODENOSCOPY (EGD) WITH ESOPHAGEAL DILATION N/A 03/25/2013   Procedure: ESOPHAGOGASTRODUODENOSCOPY (EGD) WITH ESOPHAGEAL DILATION;  Surgeon: SDanie Binder MD;  [facility];  Service: Endoscopy;  Laterality: N/A;   ESOPHAGOGASTRODUODENOSCOPY (EGD) WITH PROPOFOL  12/10/2018   Dr. FOneida Alar moderate Schatzki ring s/p dilation, gastritis, nonbleeding duodenal diverticulum   ESOPHAGOGASTRODUODENOSCOPY (EGD) WITH PROPOFOL N/A 11/07/2021   Procedure: ESOPHAGOGASTRODUODENOSCOPY (EGD) WITH PROPOFOL;  Surgeon: CEloise Harman DO;  [facility];  Service: Endoscopy;  Laterality: N/A;  herniated disc     POLYPECTOMY  12/10/2018   Procedure: POLYPECTOMY;  Surgeon: Danie Binder, MD;  [facility];  Service: Endoscopy;;  cold snare sigmoid polyp   POLYPECTOMY  11/07/2021   Procedure: POLYPECTOMY;  Surgeon: [name], DO;  [facility];  Service: Endoscopy;;   SAVORY DILATION  12/10/2018   Procedure: SAVORY DILATION;  Surgeon: Danie Binder, MD;  [facility];  Service: Endoscopy;;   TONSILLECTOMY     TUBAL LIGATION     WRIST SURGERY      Family History  Problem Relation Age of Onset   Colon cancer Brother        mid 9s, deceased   Colon cancer Father        diagnosed at 45, deceased   Hypertension Father    Hypertension Mother    Hyperlipidemia Mother    COPD Mother    Cancer Mother        lung   Heart disease Mother    Thyroid disease Mother    Irritable bowel syndrome Mother    Diabetes Sister     Irritable bowel syndrome Sister    COPD Brother     Allergies as of 02/28/2022 - Review Complete 02/28/2022  Allergen Reaction Noted   Penicillins Anaphylaxis and Itching 07/24/2011   Codeine Nausea And Vomiting 09/22/2014    Social History   Socioeconomic History   Marital status: Legally Separated    Spouse name: Not on file   Number of children: Not on file   Years of education: Not on file   Highest education level: Not on file  Occupational History   Occupation: Counsellor: [employer]    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  CNA at [employer].     Social Determinants of Health   Financial Resource Strain: Not on file  Food Insecurity: Not on file  Transportation Needs: Not on file  Physical Activity: Not on file  Stress: Not on file  Social Connections: Not on file     Review of Systems   Gen: Denies fever, chills, anorexia. Denies fatigue, weakness, weight loss.  CV: Denies chest pain, palpitations, syncope, peripheral edema, and claudication. Resp: Denies dyspnea at rest, cough, wheezing, coughing up blood, and pleurisy. GI: See HPI Derm: Denies rash, itching, dry skin Psych: Denies depression, anxiety, memory loss, confusion. No homicidal or suicidal ideation.  Heme: Denies bruising, bleeding, and enlarged lymph nodes.   Physical Exam   BP 125/83 (BP Location: Right Arm, Patient Position: Sitting, Cuff Size: Normal)   Pulse (!) 103   Temp 97.7 F (36.5 C) (Temporal)   Ht '5\' 1"'$  (1.549 m)   Wt  156 lb 9.6 oz (71 kg)   BMI 29.59 kg/m   General:   Alert and oriented. No distress noted. Pleasant and cooperative.  Head:  Normocephalic and atraumatic. Eyes:  Conjuctiva clear without scleral icterus. Mouth:  Oral mucosa pink and moist. Good dentition. No lesions. Lungs:  Clear to auscultation bilaterally. No wheezes, rales, or rhonchi. No distress.  Heart:  S1, S2 present without murmurs appreciated.  Abdomen:  +BS, soft, non-tender and non-distended. No rebound or guarding. No HSM or masses noted. Rectal: Deferred today Msk:  Symmetrical without gross deformities. Normal posture. Extremities:  Without edema. Neurologic:  Alert and  oriented x4 Psych:  Alert and cooperative. Normal mood and affect.   Assessment  [name] is a 61 y.o. female with a history of anxiety, diabetes, degenerative disk disease, fibromyalgia, GERD, HLD, and IBS presenting today for follow up  IBS-Constipation: Has been on Linzess 145 mcg as needed due to cost, symptoms have been uncontrolled.  She sometimes takes Dulcolax, enemas, and suppositories to help with relief.  She reports stools are generally soft but has issues getting the stool to come out.  Will increase dose of Linzess to 290 mcg samples, she reports she will be having change of insurance soon.  If issues getting Linzess, she has been instructed to start MiraLAX 1 capful twice daily.  We will get process started for patient assistance.  Samples provided today for new dose.  Hemorrhoids: Patient reports longstanding history of hemorrhoids.  Hemorrhoids described as grade 2/3.  Sometimes they will reduce on their own sometimes she has to manually reduce them.  Currently denying any itching or pain, significantly is having intermittent bleeding.  This is likely exacerbated by the time that she spends on the commode which is about an hour sometimes at a time due to her constipation.  We discussed hemorrhoid banding and anoscopy today.  We will  see her back next week for an anoscopy and attempt hemorrhoid banding to give her relief.  She is not on any blood thinners.  GERD/Dysphagia: Maintained on omeprazole 20 mg once daily, sometimes twice daily.  EGD in May 2023 with mild Schatzki's ring s/p dilation. Also takes over the counter antacid as well. Has pill dysphagia and has been taking them with applesauce to help it slid down. Has trouble with more rigid solid food as well even with chewing well.  Given ongoing issues despite dilation, she may have motility disorder.  We will get post endoscopy BPE to rule out any stricturing or narrowing not observed on EGD and to evaluate for reflux and her motility.  We discussed possible need for manometry if BPE normal.  PLAN   BPE Hemorrhoid banding next week MiraLAX 17 g or 1 capful twice daily if unable to get Linzess.  Linzess 290 mcg samples, we will submit for patient assistance.   [name], MSN, FNP-BC, AGACNP-BC [facility]

## 2022-02-28 NOTE — Patient Instructions (Signed)
Continue to avoid straining. Limit toilet time to 2-3 minutes at the most if possible. Avoid constipation. Take 2 tablespoons of natural wheat bran, natural oat bran, flax, Benefiber or any over the counter fiber supplement and increase your water intake to 7-8 glasses daily. You may take miralax 1 capful twice daily to help with constipation.   I am going to provide you with some samples of Linzess today.  We will also get paperwork together to submit for patient assistance since the medication has been unaffordable.  While you work towards getting new insurance and getting patient assistance for your Linzess I want you to take the MiraLAX as prescribed above.  For hemorrhoids, we will get you in next week for anoscopy and hemorrhoid banding.  To further assess your swallowing issues even after dilation, we will get you scheduled for a barium pill esophagram.  If you need to reschedule due to change of insurance once a day is given to you please call our office and let us know.  I will see you next week for banding.  It was a pleasure to see you today. I want to create trusting relationships with patients. If you receive a survey regarding your visit,  I greatly appreciate you taking time to fill this out on paper or through your MyChart. I value your feedback.  [name], MSN, FNP-BC, AGACNP-BC [facility]

## 2022-02-28 NOTE — Telephone Encounter (Signed)
Patient informed that the BPE is scheduled for 03/06/22 at 9:00 am, be at [facility] at 8:45 am for check in. Nothing to eat or drink 3 hours prior to procedure.

## 2022-03-06 ENCOUNTER — Ambulatory Visit (HOSPITAL_COMMUNITY)
Admission: RE | Admit: 2022-03-06 | Discharge: 2022-03-06 | Disposition: A | Discharge status: Home / Self Care | Payer: 59 | Source: Ambulatory Visit | Attending: Gastroenterology | Admitting: Gastroenterology

## 2022-03-06 DIAGNOSIS — R1310 Dysphagia, unspecified: Secondary | ICD-10-CM | POA: Diagnosis present

## 2022-03-06 NOTE — Progress Notes (Unsigned)
   [place] Banding Procedure Note:   [name] is a 61 y.o. female presenting today for consideration of hemorrhoid banding. Last colonoscopy 11/07/21: no-bleeding internal hemorrhoids, four 1-2 mm polyps in transverse colon.   She was seen in the office last week regarding her hemorrhoids. They do protrude when I observed on exam today and are easily reducible but they do come back out shortly after. She spends 15 minutes to 1 hour on the commode at a time. Her primary complaint with her hemorrhoids is the bleeding but does have leaking/soilage at times and pressure which she reports today.   The patient presents with symptomatic grade 2/3 hemorrhoids, unresponsive to maximal medical therapy, requesting rubber band ligation of his/her hemorrhoidal disease. All risks, benefits, and alternative forms of therapy were described and informed consent was obtained.  The decision was made to band the right anterior internal hemorrhoid, and the [place] was used to perform band ligation without complication. Digital anorectal examination was then performed to assure proper positioning of the band, and to adjust the banded tissue as required. The patient was discharged home without pain or other issues. Dietary and behavioral recommendations were given and (if necessary prescriptions were given), along with follow-up instructions. The patient will return in about 1 month for followup and possible additional banding as required.  Complications -  Right anterior hemorrhoid column banded, patient began experiencing mild pain sensation to her left side radiating partially down her left leg. She had some discomfort with manipulating the band. [name], NP also evaluated the patient and checked band placement. She does have chronic back pain with a herniated disk and given that she is not having pain on the side that was banded we will treat for anal spasm. Advised her to take tylenol and alternate with  ibuprofen and we discussed return precautions.   Sending in compounded hemorrhoid cream with lidocaine and 0.2% nitroglycerin for her to use 2-3 times daily for rectal spasm.   She will follow up once she has established new insurance. We also discussed her BPE results during the visit today.    [name], MSN, FNP-BC, AGACNP-BC [facility]

## 2022-03-07 ENCOUNTER — Encounter: Payer: Self-pay | Admitting: Gastroenterology

## 2022-03-07 ENCOUNTER — Ambulatory Visit (INDEPENDENT_AMBULATORY_CARE_PROVIDER_SITE_OTHER): Payer: 59 | Admitting: Gastroenterology

## 2022-03-07 VITALS — BP 119/79 | HR 98 | Temp 97.3°F | Ht 61.0 in | Wt 158.8 lb

## 2022-03-07 DIAGNOSIS — K642 Third degree hemorrhoids: Secondary | ICD-10-CM | POA: Diagnosis not present

## 2022-03-07 NOTE — Patient Instructions (Addendum)
Continue to avoid straining. Limit toilet time to 2-3 minutes at the most.   Avoid constipation. Take 2 tablespoons of natural wheat bran, natural oat bran, flax, Benefiber or any over the counter fiber supplement and increase your water intake to 7-8 glasses daily.  Occasionally, you may have more bleeding than usual after the banding procedure. This is often from the untreated hemorrhoids rather than the treated one. Don't be concerned if there is a tablespoon or so of blood. If there is more blood than this, lie flat with your bottom higher than your head and apply an ice pack to the area. If the bleeding does not stop within a half an hour or if you feel faint, have severe pain, chills, fever or difficulty passing urine (very rare) or other problems, you should call us at [phone] or report to the nearest emergency room.call our office at [phone] or go to the emergency room.  Please call me with any concerns!  The procedure you have had should have been relatively painless since the banding of the area involved does not have nerve endings and there is no pain sensation. The rubber band cuts off the blood supply to the hemorrhoid and the band may fall off as soon as 48 hours after the banding (the band may occasionally be seen in the toilet bowl following a bowel movement). You may notice a temporary feeling of fullness in the rectum which should respond adequately to plain Tylenol or Motrin.  I will see you back in follow-up and/or for additional banding once you have obtained your new insurance.  I am calling in a cream at [place] to help treat a possible anal spasm. Use this 3 times a day for the next week. It does contain nitroglycerin so please monitor for dizziness and headaches and only use the amount prescribed. You may alternate ibuprofen and tylenol for the pain.   [name], MSN, FNP-BC, AGACNP-BC [facility]

## 2022-03-27 NOTE — Progress Notes (Unsigned)
   [place] Banding Procedure Note:   NAYLENE FOELL is a 61 y.o. female presenting today for consideration of hemorrhoid banding. Last colonoscopy 11/07/21 with non bleeding internal hemorrhoids and four 1-2 mm colon polyps in the transverse colon. After initial hemorrhoid banding she began to have radiating pain from her left buttocks and partially down her left leg. She was treated for anal spasm with compounded hemorrhoid cream. This treatment has been ***   The patient presents with symptomatic grade 3 hemorrhoids, unresponsive to maximal medical therapy, requesting rubber band ligation of her hemorrhoidal disease. All risks, benefits, and alternative forms of therapy were described and informed consent was obtained.  In the left lateral decubitus position (if anoscopy is performed) anoscopic examination revealed grade *** hemorrhoids in the *** position (s).  The decision was made to band the *** internal hemorrhoid, and the [place] was used to perform band ligation without complication. Digital anorectal examination was then performed to assure proper positioning of the band, and to adjust the banded tissue as required. The patient was discharged home without pain or other issues. Dietary and behavioral recommendations were given and (if necessary prescriptions were given), along with follow-up instructions. The patient will return in *** for followup and possible additional banding as required.  No complications were encountered and the patient tolerated the procedure well.    [name], MSN, FNP-BC, AGACNP-BC [facility]

## 2022-03-28 ENCOUNTER — Ambulatory Visit (INDEPENDENT_AMBULATORY_CARE_PROVIDER_SITE_OTHER): Payer: 59 | Admitting: Gastroenterology

## 2022-03-28 ENCOUNTER — Encounter: Payer: Self-pay | Admitting: Gastroenterology

## 2022-03-28 VITALS — BP 142/87 | HR 106 | Temp 98.1°F | Ht 61.0 in | Wt 161.2 lb

## 2022-03-28 DIAGNOSIS — K5900 Constipation, unspecified: Secondary | ICD-10-CM | POA: Diagnosis not present

## 2022-03-28 DIAGNOSIS — K642 Third degree hemorrhoids: Secondary | ICD-10-CM

## 2022-03-28 MED ORDER — LINACLOTIDE 145 MCG PO CAPS: 145.0000 ug | ORAL_CAPSULE | Freq: Every day | ORAL | 3 refills | Status: DC

## 2022-03-28 NOTE — Patient Instructions (Signed)
Continue to avoid straining.   Limit toilet time to 2-3 minutes at the most.   Avoid constipation. Take 2 tablespoons of natural wheat bran, natural oat bran, flax, Benefiber or any over the counter fiber supplement and increase your water intake to 7-8 glasses daily.  Occasionally, you may have more bleeding than usual after the banding procedure. This is often from the untreated hemorrhoids rather than the treated one. Don't be concerned if there is a tablespoon or so of blood. If there is more blood than this, lie flat with your bottom higher than your head and apply an ice pack to the area. If the bleeding does not stop within a half an hour or if you feel faint, have severe pain, chills, fever or difficulty passing urine (very rare) or other problems, you should call us at [phone] or report to the nearest emergency room.call our office at [phone] or go to the emergency room.  Please call me with any concerns!  The procedure you have had should have been relatively painless since the banding of the area involved does not have nerve endings and there is no pain sensation. The rubber band cuts off the blood supply to the hemorrhoid and the band may fall off as soon as 48 hours after the banding (the band may occasionally be seen in the toilet bowl following a bowel movement). You may notice a temporary feeling of fullness in the rectum which should respond adequately to plain Tylenol or Motrin.  I will see you back in follow-up for additional banding in 2-3 weeks.  I am providing you samples of Linzess today.  I will send a prescription for Linzess 145 mcg daily to your pharmacy.  We can also submit for patient assistance if needed.   [name], MSN, FNP-BC, AGACNP-BC [facility]

## 2022-03-29 ENCOUNTER — Other Ambulatory Visit: Payer: Self-pay | Admitting: Gastroenterology

## 2022-03-29 ENCOUNTER — Telehealth: Payer: Self-pay | Admitting: *Deleted

## 2022-03-29 ENCOUNTER — Encounter: Payer: Self-pay | Admitting: Gastroenterology

## 2022-03-29 MED ORDER — LUBIPROSTONE 8 MCG PO CAPS: 8.0000 ug | ORAL_CAPSULE | Freq: Two times a day (BID) | ORAL | 2 refills | Status: DC

## 2022-03-29 NOTE — Telephone Encounter (Signed)
Received letter denying Linzess 174mg. Insurance suggest she try Amitza.

## 2022-03-30 NOTE — Telephone Encounter (Signed)
Spoke to pt, informed her that Amitza was sent to the pharmacy. Pt voiced understanding.

## 2022-03-31 ENCOUNTER — Telehealth: Payer: Self-pay | Admitting: *Deleted

## 2022-03-31 NOTE — Telephone Encounter (Signed)
Received approval letter for Lubiprostone. Sent to be scanned in patients chart.

## 2022-04-04 ENCOUNTER — Encounter: Payer: 59 | Admitting: Gastroenterology

## 2022-04-07 NOTE — Progress Notes (Unsigned)
   [place] Banding Procedure Note:   [name] is a 61 y.o. female presenting today for consideration of hemorrhoid banding. Last colonoscopy 11/07/21 with non bleeding internal hemorrhoids and for 1-2 mm polyps in the transverse colon. Had some initial rectal spasms and radiation down her left leg after first hemorrhoid banding that resolved after 30 minutes. She had improvement after first banding with significantly less BRBPR, only have toilet tissue hematochezia. She has noted *** improvement since last banding...***. She had noted good improvement of constipation with Linzess however insurance denied and required her to try Amitiza. ***   The patient presents with symptomatic grade III*** hemorrhoids, unresponsive to maximal medical therapy, requesting rubber band ligation of his/her hemorrhoidal disease. All risks, benefits, and alternative forms of therapy were described and informed consent was obtained.  In the left lateral decubitus position (if anoscopy is performed) anoscopic examination revealed grade *** hemorrhoids in the *** position (s).  The decision was made to band the left lateral*** internal hemorrhoid, and the [place] was used to perform band ligation without complication. Digital anorectal examination was then performed to assure proper positioning of the band, and to adjust the banded tissue as required. The patient was discharged home without pain or other issues. Dietary and behavioral recommendations were given and (if necessary prescriptions were given), along with follow-up instructions. The patient will return in *** for followup and possible additional banding as required.  No complications were encountered and the patient tolerated the procedure well.    [name], MSN, FNP-BC, AGACNP-BC [facility]

## 2022-04-11 ENCOUNTER — Ambulatory Visit (INDEPENDENT_AMBULATORY_CARE_PROVIDER_SITE_OTHER): Payer: 59 | Admitting: Gastroenterology

## 2022-04-11 ENCOUNTER — Encounter: Payer: Self-pay | Admitting: Gastroenterology

## 2022-04-11 VITALS — BP 118/73 | HR 90 | Temp 97.9°F | Ht 61.0 in | Wt 163.0 lb

## 2022-04-11 DIAGNOSIS — K642 Third degree hemorrhoids: Secondary | ICD-10-CM

## 2022-04-11 NOTE — Patient Instructions (Signed)
Continue to avoid straining.   Limit toilet time to 2-3 minutes at the most.   Avoid constipation. Take 2 tablespoons of natural wheat bran, natural oat bran, flax, Benefiber or any over the counter fiber supplement and increase your water intake to 7-8 glasses daily.  Occasionally, you may have more bleeding than usual after the banding procedure. This is often from the untreated hemorrhoids rather than the treated one. Don't be concerned if there is a tablespoon or so of blood. If there is more blood than this, lie flat with your bottom higher than your head and apply an ice pack to the area. If the bleeding does not stop within a half an hour or if you feel faint, have severe pain, chills, fever or difficulty passing urine (very rare) or other problems, you should call us at [phone] or report to the nearest emergency room.call our office at [phone] or go to the emergency room.  Please call me with any concerns!  You have compounded hemorrhoid cream available at [facility] if needed.  Please do not hesitate to call the office if you have any concerns, I can fit you in for manipulation of the hemorrhoid band this afternoon if discomfort from spasm continues.  The procedure you have had should have been relatively painless since the banding of the area involved does not have nerve endings and there is no pain sensation. The rubber band cuts off the blood supply to the hemorrhoid and the band may fall off as soon as 48 hours after the banding (the band may occasionally be seen in the toilet bowl following a bowel movement). You may notice a temporary feeling of fullness in the rectum which should respond adequately to plain Tylenol or Motrin.  I will see you back in follow-up and/or for additional banding.   [name], MSN, FNP-BC, AGACNP-BC [facility]

## 2022-04-13 ENCOUNTER — Encounter: Payer: 59 | Admitting: Gastroenterology

## 2022-04-18 ENCOUNTER — Encounter (HOSPITAL_COMMUNITY): Payer: Self-pay

## 2022-04-18 ENCOUNTER — Ambulatory Visit (HOSPITAL_COMMUNITY): Payer: 59

## 2022-04-26 ENCOUNTER — Ambulatory Visit: Payer: 59 | Admitting: Cardiology

## 2022-04-26 ENCOUNTER — Encounter: Payer: Self-pay | Admitting: Cardiology

## 2022-04-26 NOTE — Progress Notes (Deleted)
Cardiology Office Note  Date: 04/26/2022   ID: [name], DOB 02-15-1961, MRN [id number]  PCP:  Raven Andrea, MD  Cardiologist:  Raven Breeding, MD Electrophysiologist:  None   No chief complaint on file.   History of Present Illness: [name] is a 61 y.o. female patient of [name] last seen in 2020, referred back to the office by [name] for evaluation of shortness of breath and chest pain.  I reviewed her records and updated the chart.  Echocardiogram from April 2020 reported LVEF 60 to 65% with mild diastolic dysfunction, normal RV contraction, no significant valvular abnormalities.  I cannot locate any prior ischemic testing.  Past Medical History:  Diagnosis Date   Anxiety    DDD (degenerative disc disease)    Diverticular disease    Fibromyalgia    GERD (gastroesophageal reflux disease)    Horseshoe kidney    Hypercholesterolemia    Irritable bowel syndrome    Type 2 diabetes mellitus ([place])     Past Surgical History:  Procedure Laterality Date   BALLOON DILATION N/A 11/07/2021   Procedure: BALLOON DILATION;  Surgeon: [name], DO;  [facility];  Service: Endoscopy;  Laterality: N/A;   BIOPSY  11/07/2021   Procedure: BIOPSY;  Surgeon: [name], DO;  [facility];  Service: Endoscopy;;   cervical dysplasia     COLONOSCOPY  07/01/2009   UKG:[id number] seen in the ascending and sigmoid colon/small internal hemorrhoids/6-mm sessile polyp/4-mm sessile polyp.3-mm sessile polyp removed. simple adenomas   COLONOSCOPY N/A 03/25/2013   Procedure: COLONOSCOPY;  Surgeon: Danie Binder, MD;  [facility];  Service: Endoscopy;  Laterality: N/A;  10:15   COLONOSCOPY WITH PROPOFOL N/A 12/10/2018   Dr. Oneida Alar: 16m serrated colon polyp, normal TI, hemorrhoids, diverticulosis. next TCS in five years.    COLONOSCOPY WITH PROPOFOL N/A 11/07/2021   Procedure: COLONOSCOPY WITH PROPOFOL;  Surgeon: CEloise Harman DO;  [facility];  Service: Endoscopy;  Laterality: N/A;  1:30pm   ESOPHAGEAL DILATION N/A 02/04/2015   Procedure: ESOPHAGEAL DILATION;  Surgeon: SDanie Binder MD;  [facility];  Service: Endoscopy;  Laterality: N/A;   ESOPHAGOGASTRODUODENOSCOPY  07/01/2009   SEGB:TDVVOHesophagus/dilation to 16 mm possible proximal cervical web   ESOPHAGOGASTRODUODENOSCOPY N/A 02/04/2015   Procedure: ESOPHAGOGASTRODUODENOSCOPY (EGD);  Surgeon: SDanie Binder MD;  [facility];  Service: Endoscopy;  Laterality: N/A;  0800-moved to 845 Office to notify pt   ESOPHAGOGASTRODUODENOSCOPY (EGD) WITH ESOPHAGEAL DILATION N/A 03/25/2013   Procedure: ESOPHAGOGASTRODUODENOSCOPY (EGD) WITH ESOPHAGEAL DILATION;  Surgeon: SDanie Binder MD;  [facility];  Service: Endoscopy;  Laterality: N/A;   ESOPHAGOGASTRODUODENOSCOPY (EGD) WITH PROPOFOL  12/10/2018   Dr. FOneida Alar moderate Schatzki ring s/p dilation, gastritis, nonbleeding duodenal diverticulum   ESOPHAGOGASTRODUODENOSCOPY (EGD) WITH PROPOFOL N/A 11/07/2021   Procedure: ESOPHAGOGASTRODUODENOSCOPY (EGD) WITH PROPOFOL;  Surgeon: CEloise Harman DO;  [facility];  Service: Endoscopy;  Laterality: N/A;   herniated disc     POLYPECTOMY  12/10/2018   Procedure: POLYPECTOMY;  Surgeon: FDanie Binder MD;  [facility];  Service: Endoscopy;;  cold snare sigmoid polyp   POLYPECTOMY  11/07/2021   Procedure: POLYPECTOMY;  Surgeon: CEloise Harman DO;  [facility];  Service: Endoscopy;;   SAVORY DILATION  12/10/2018   Procedure: SAVORY DILATION;  Surgeon: FDanie Binder MD;  [facility];  Service: Endoscopy;;  TONSILLECTOMY     TUBAL LIGATION     WRIST SURGERY      Current Outpatient Medications  Medication Sig Dispense Refill   albuterol (VENTOLIN HFA) 108 (90 Base) MCG/ACT inhaler Inhale into the lungs.     amitriptyline (ELAVIL) 10 MG tablet Take 10 mg by mouth at  bedtime.     atorvastatin (LIPITOR) 10 MG tablet Take 10 mg by mouth daily.     Ca Carbonate-Mag Hydroxide (ROLAIDS PO) Take by mouth.     cyclobenzaprine (FLEXERIL) 10 MG tablet SMARTSIG:1 Tablet(s) By Mouth 1-3 Times Daily     doxycycline (VIBRAMYCIN) 100 MG capsule Take 100 mg by mouth 2 (two) times daily.     FEROSUL 325 (65 Fe) MG tablet Take 325 mg by mouth daily.     fluticasone (FLONASE) 50 MCG/ACT nasal spray Place 2 sprays into both nostrils daily. 16 g 0   furosemide (LASIX) 40 MG tablet Take 40 mg by mouth daily as needed.     gabapentin (NEURONTIN) 600 MG tablet SMARTSIG:1 Tablet(s) By Mouth 1-3 Times Daily     lubiprostone (AMITIZA) 8 MCG capsule Take 1 capsule (8 mcg total) by mouth 2 (two) times daily with a meal. 60 capsule 2   metFORMIN (GLUCOPHAGE-XR) 500 MG 24 hr tablet Take 500 mg by mouth daily.     methocarbamol (ROBAXIN) 750 MG tablet Take 750 mg by mouth daily as needed for muscle spasms. (Patient not taking: Reported on 04/11/2022)     omeprazole (PRILOSEC) 20 MG capsule Take 20 mg by mouth as needed (acid reflux).     Potassium Chloride ER 20 MEQ TBCR Take 1 tablet by mouth daily.     Rimegepant Sulfate (NURTEC) 75 MG TBDP Take 75 mg by mouth once.     simethicone (MYLICON) 025 MG chewable tablet Chew 125 mg by mouth every 6 (six) hours as needed for flatulence.     No current facility-administered medications for this visit.   Allergies:  Penicillins and Codeine   Social History: The patient  reports that she quit smoking about 24 years ago. Her smoking use included cigarettes. She has a 25.00 pack-year smoking history. She has never used smokeless tobacco. She reports that she does not drink alcohol and does not use drugs.   Family History: The patient's family history includes COPD in her brother and mother; Cancer in her mother; Colon cancer in her brother and father; Diabetes in her sister; Heart disease in her mother; Hyperlipidemia in her mother; Hypertension  in her father and mother; Irritable bowel syndrome in her mother and sister; Thyroid disease in her mother.   ROS:  Please see the history of present illness. Otherwise, complete review of systems is positive for {NONE DEFAULTED:18576}.  All other systems are reviewed and negative.   Physical Exam: VS:  There were no vitals taken for this visit., BMI There is no height or weight on file to calculate BMI.  Wt Readings from Last 3 Encounters:  04/11/22 163 lb (73.9 kg)  03/28/22 161 lb 3.2 oz (73.1 kg)  03/07/22 158 lb 12.8 oz (72 kg)    General: Patient appears comfortable at rest. HEENT: Conjunctiva and lids normal, oropharynx clear with moist mucosa. Neck: Supple, no elevated JVP or carotid bruits, no thyromegaly. Lungs: Clear to auscultation, nonlabored breathing at rest. Cardiac: Regular rate and rhythm, no S3 or significant systolic murmur, no pericardial rub. Abdomen: Soft, nontender, no hepatomegaly, bowel sounds present, no guarding or rebound. Extremities: No pitting  edema, distal pulses 2+. Skin: Warm and dry. Musculoskeletal: No kyphosis. Neuropsychiatric: Alert and oriented x3, affect grossly appropriate.  ECG:  An ECG dated 11/04/2021 was personally reviewed today and demonstrated:  Sinus rhythm.  Recent Labwork: 10/15/2021: ALT 18; AST 19; BUN 20; Creatinine, Ser 1.11; Hemoglobin 15.6; Platelets 209; Potassium 3.6; Sodium 137   Other Studies Reviewed Today:  Echocardiogram 10/25/2018:  1. The left ventricle has normal systolic function with an ejection  fraction of 60-65%. The cavity size was normal. Left ventricular diastolic  Doppler parameters are consistent with impaired relaxation.   2. The right ventricle has normal systolic function. The cavity was  normal. There is no increase in right ventricular wall thickness.   3. No evidence of mitral valve stenosis.   4. The aortic valve is tricuspid. No stenosis of the aortic valve.   5. The aortic root is normal in size  and structure.   Assessment and Plan:   Medication Adjustments/Labs and Tests Ordered: Current medicines are reviewed at length with the patient today.  Concerns regarding medicines are outlined above.   Tests Ordered: No orders of the defined types were placed in this encounter.   Medication Changes: No orders of the defined types were placed in this encounter.   Disposition:  Follow up {follow up:15908}  Signed, Satira Sark, MD, [facility] 04/26/2022 10:25 AM    [facility] at [facility] [address], [place], [place] [address] Phone: [phone]; Fax: [phone]

## 2022-05-04 ENCOUNTER — Encounter: Payer: 59 | Admitting: Gastroenterology

## 2022-05-23 ENCOUNTER — Encounter: Payer: Self-pay | Admitting: Internal Medicine

## 2022-05-23 ENCOUNTER — Ambulatory Visit: Payer: 59 | Attending: Cardiology | Admitting: Internal Medicine

## 2022-05-23 ENCOUNTER — Encounter: Payer: Self-pay | Admitting: *Deleted

## 2022-05-23 VITALS — BP 124/70 | HR 84 | Ht 62.0 in | Wt 165.0 lb

## 2022-05-23 DIAGNOSIS — E785 Hyperlipidemia, unspecified: Secondary | ICD-10-CM | POA: Insufficient documentation

## 2022-05-23 DIAGNOSIS — E7849 Other hyperlipidemia: Secondary | ICD-10-CM

## 2022-05-23 DIAGNOSIS — R0609 Other forms of dyspnea: Secondary | ICD-10-CM

## 2022-05-23 NOTE — Patient Instructions (Signed)
Medication Instructions:  Your physician recommends that you continue on your current medications as directed. Please refer to the Current Medication list given to you today.  *If you need a refill on your cardiac medications before your next appointment, please call your pharmacy*   Lab Work: NONE   If you have labs (blood work) drawn today and your tests are completely normal, you will receive your results only by: [place] (if you have MyChart) OR A paper copy in the mail If you have any lab test that is abnormal or we need to change your treatment, we will call you to review the results.   Testing/Procedures: Your physician has requested that you have an echocardiogram. Echocardiography is a painless test that uses sound waves to create images of your heart. It provides your doctor with information about the size and shape of your heart and how well your heart's chambers and valves are working. This procedure takes approximately one hour. There are no restrictions for this procedure. Please do NOT wear cologne, perfume, aftershave, or lotions (deodorant is allowed). Please arrive 15 minutes prior to your appointment time.  Your physician has requested that you have a lexiscan myoview. For further information please visit [email or web address]. Please follow instruction sheet, as given.    Follow-Up: At [facility], you and your health needs are our priority.  As part of our continuing mission to provide you with exceptional heart care, we have created designated Provider Care Teams.  These Care Teams include your primary Cardiologist (physician) and Advanced Practice Providers (APPs -  Physician Assistants and Nurse Practitioners) who all work together to provide you with the care you need, when you need it.  We recommend signing up for the patient portal called "MyChart".  Sign up information is provided on this After Visit Summary.  MyChart is used to connect with  patients for Virtual Visits (Telemedicine).  Patients are able to view lab/test results, encounter notes, upcoming appointments, etc.  Non-urgent messages can be sent to your provider as well.   To learn more about what you can do with MyChart, go to [email or web address].    Your next appointment:   3 month(s)  The format for your next appointment:   In Person  Provider:   Dr. Dellia Cloud      Other Instructions Thank you for choosing [facility]!    Important Information About Sugar

## 2022-05-23 NOTE — Progress Notes (Signed)
Cardiology Office Note  Date: 05/23/2022   ID: [name], DOB 22-Jun-1961, MRN [id number]  PCP:  Leslie Andrea, MD  Cardiologist:  Chalmers Guest, MD Electrophysiologist:  None   Reason for Office Visit: Dyspnea on exertion at the request of Dr. Karie Kirks   History of Present Illness: [name] is a 61 y.o. female known to have HLD, DM 2 was referred to the cardiology clinic for evaluation of dyspnea on exertion with request of Dr. Karie Kirks.  Patient stated that she has been having dyspnea on exertion for more than 1 year associated with LE swelling which is currently being resolved after taking p.o. Lasix. However she stated she has been noticing DOE and LE swelling worsening recently.Reported occasional chest discomfort (unable to recall the frequency). Denied any lightheadedness, dizziness, syncope, palpitations. Denies smoking cigarettes, alcohol use or illicit drug abuse. No prior ischemic evaluation.  No prior history of MI/CABG/PCI. No family history of premature ASCVD.  Past Medical History:  Diagnosis Date   Anxiety    DDD (degenerative disc disease)    Diverticular disease    Fibromyalgia    GERD (gastroesophageal reflux disease)    Horseshoe kidney    Hypercholesterolemia    Irritable bowel syndrome    Type 2 diabetes mellitus ([place])     Past Surgical History:  Procedure Laterality Date   BALLOON DILATION N/A 11/07/2021   Procedure: BALLOON DILATION;  Surgeon: [name], DO;  [facility];  Service: Endoscopy;  Laterality: N/A;   BIOPSY  11/07/2021   Procedure: BIOPSY;  Surgeon: [name], DO;  [facility];  Service: Endoscopy;;   cervical dysplasia     COLONOSCOPY  07/01/2009   IRJ:[id number] seen in the ascending and sigmoid colon/small internal hemorrhoids/6-mm sessile polyp/4-mm sessile polyp.3-mm sessile polyp removed. simple adenomas   COLONOSCOPY N/A 03/25/2013   Procedure: COLONOSCOPY;  Surgeon:  Danie Binder, MD;  [facility];  Service: Endoscopy;  Laterality: N/A;  10:15   COLONOSCOPY WITH PROPOFOL N/A 12/10/2018   Dr. Oneida Alar: 72m serrated colon polyp, normal TI, hemorrhoids, diverticulosis. next TCS in five years.    COLONOSCOPY WITH PROPOFOL N/A 11/07/2021   Procedure: COLONOSCOPY WITH PROPOFOL;  Surgeon: CEloise Harman DO;  [facility];  Service: Endoscopy;  Laterality: N/A;  1:30pm   ESOPHAGEAL DILATION N/A 02/04/2015   Procedure: ESOPHAGEAL DILATION;  Surgeon: SDanie Binder MD;  [facility];  Service: Endoscopy;  Laterality: N/A;   ESOPHAGOGASTRODUODENOSCOPY  07/01/2009   SZSW:FUXNATesophagus/dilation to 16 mm possible proximal cervical web   ESOPHAGOGASTRODUODENOSCOPY N/A 02/04/2015   Procedure: ESOPHAGOGASTRODUODENOSCOPY (EGD);  Surgeon: SDanie Binder MD;  [facility];  Service: Endoscopy;  Laterality: N/A;  0800-moved to 845 Office to notify pt   ESOPHAGOGASTRODUODENOSCOPY (EGD) WITH ESOPHAGEAL DILATION N/A 03/25/2013   Procedure: ESOPHAGOGASTRODUODENOSCOPY (EGD) WITH ESOPHAGEAL DILATION;  Surgeon: SDanie Binder MD;  [facility];  Service: Endoscopy;  Laterality: N/A;   ESOPHAGOGASTRODUODENOSCOPY (EGD) WITH PROPOFOL  12/10/2018   Dr. FOneida Alar moderate Schatzki ring s/p dilation, gastritis, nonbleeding duodenal diverticulum   ESOPHAGOGASTRODUODENOSCOPY (EGD) WITH PROPOFOL N/A 11/07/2021   Procedure: ESOPHAGOGASTRODUODENOSCOPY (EGD) WITH PROPOFOL;  Surgeon: CEloise Harman DO;  [facility];  Service: Endoscopy;  Laterality: N/A;   herniated disc     POLYPECTOMY  12/10/2018   Procedure: POLYPECTOMY;  Surgeon: FDanie Binder MD;  [facility];  Service: Endoscopy;;  cold snare  sigmoid polyp   POLYPECTOMY  11/07/2021   Procedure: POLYPECTOMY;  Surgeon: [name], DO;  [facility];  Service: Endoscopy;;   SAVORY DILATION  12/10/2018   Procedure: SAVORY DILATION;   Surgeon: Danie Binder, MD;  [facility];  Service: Endoscopy;;   TONSILLECTOMY     TUBAL LIGATION     WRIST SURGERY      Current Outpatient Medications  Medication Sig Dispense Refill   albuterol (VENTOLIN HFA) 108 (90 Base) MCG/ACT inhaler Inhale into the lungs.     amitriptyline (ELAVIL) 10 MG tablet Take 10 mg by mouth at bedtime.     atorvastatin (LIPITOR) 10 MG tablet Take 10 mg by mouth daily.     Ca Carbonate-Mag Hydroxide (ROLAIDS PO) Take by mouth.     cyclobenzaprine (FLEXERIL) 10 MG tablet SMARTSIG:1 Tablet(s) By Mouth 1-3 Times Daily     doxycycline (VIBRAMYCIN) 100 MG capsule Take 100 mg by mouth 2 (two) times daily.     FEROSUL 325 (65 Fe) MG tablet Take 325 mg by mouth daily.     fluticasone (FLONASE) 50 MCG/ACT nasal spray Place 2 sprays into both nostrils daily. 16 g 0   furosemide (LASIX) 40 MG tablet Take 40 mg by mouth daily as needed.     gabapentin (NEURONTIN) 600 MG tablet SMARTSIG:1 Tablet(s) By Mouth 1-3 Times Daily     lubiprostone (AMITIZA) 8 MCG capsule Take 1 capsule (8 mcg total) by mouth 2 (two) times daily with a meal. 60 capsule 2   metFORMIN (GLUCOPHAGE-XR) 500 MG 24 hr tablet Take 500 mg by mouth daily.     methocarbamol (ROBAXIN) 750 MG tablet Take 750 mg by mouth daily as needed for muscle spasms.     omeprazole (PRILOSEC) 20 MG capsule Take 20 mg by mouth as needed (acid reflux).     Potassium Chloride ER 20 MEQ TBCR Take 1 tablet by mouth daily.     Rimegepant Sulfate (NURTEC) 75 MG TBDP Take 75 mg by mouth once.     simethicone (MYLICON) 053 MG chewable tablet Chew 125 mg by mouth every 6 (six) hours as needed for flatulence.     No current facility-administered medications for this visit.   Allergies:  Penicillins and Codeine   Social History: The patient  reports that she quit smoking about 24 years ago. Her smoking use included cigarettes. She has a 25.00 pack-year smoking history. She has never used smokeless tobacco. She  reports that she does not drink alcohol and does not use drugs.   Family History: The patient's family history includes COPD in her brother and mother; Cancer in her mother; Colon cancer in her brother and father; Diabetes in her sister; Heart disease in her mother; Hyperlipidemia in her mother; Hypertension in her father and mother; Irritable bowel syndrome in her mother and sister; Thyroid disease in her mother.   ROS:  Please see the history of present illness. Otherwise, complete review of systems is positive for none.  All other systems are reviewed and negative.   Physical Exam: VS:  BP 124/70   Pulse 84   Ht '5\' 2"'$  (1.575 m)   Wt 165 lb (74.8 kg)   SpO2 98%   BMI 30.18 kg/m , BMI Body mass index is 30.18 kg/m.  Wt Readings from Last 3 Encounters:  05/23/22 165 lb (74.8 kg)  04/11/22 163 lb (73.9 kg)  03/28/22 161 lb 3.2 oz (73.1 kg)    General: Patient appears  comfortable at rest. HEENT: Conjunctiva and lids normal, oropharynx clear with moist mucosa. Neck: Supple, no elevated JVP or carotid bruits, no thyromegaly. Lungs: Clear to auscultation, nonlabored breathing at rest. Cardiac: Regular rate and rhythm, no S3 or significant systolic murmur, no pericardial rub. Abdomen: Soft, nontender, no hepatomegaly, bowel sounds present, no guarding or rebound. Extremities: No pitting edema, distal pulses 2+. Skin: Warm and dry. Musculoskeletal: No kyphosis. Neuropsychiatric: Alert and oriented x3, affect grossly appropriate.  EKG : NSR  Recent Labwork: 10/15/2021: ALT 18; AST 19; BUN 20; Creatinine, Ser 1.11; Hemoglobin 15.6; Platelets 209; Potassium 3.6; Sodium 137  No results found for: "CHOL", "TRIG", "HDL", "CHOLHDL", "VLDL", "LDLCALC", "LDLDIRECT"  Other Studies Reviewed Today: Echo from 2020 LVEF preserved  Assessment and Plan: Patient is a 61 year old F known to have HLD, DM 2 was referred to cardiology clinic for evaluation of DOE at the request of Dr.  Karie Kirks.  #Dyspnea on exertion, rule out HFpEF -Patient has been having DOE with leg swelling for more than a year but has noticed recent worsening of her symptoms. I will obtain 2D echo to assess for any change in the LVEF and diastology. -DOE being anginal equivalent, I will obtain pharmacological nuclear stress test. Patient did not prefer exercise test.  #HLD, unknown values -Continue atorvastatin 10 mg nightly.  Follow-up with PCP for lipid values.  Goal LDL less than 100.  I have spent a total of 45 minutes with patient reviewing chart , EKGs, labs and examining patient as well as establishing an assessment and plan that was discussed with the patient.  > 50% of time was spent in direct patient care.     Medication Adjustments/Labs and Tests Ordered: Current medicines are reviewed at length with the patient today.  Concerns regarding medicines are outlined above.   Tests Ordered: Orders Placed This Encounter  Procedures   NM Myocar Multi W/Spect W/Wall Motion / EF   EKG 12-Lead   ECHOCARDIOGRAM COMPLETE    Medication Changes: No orders of the defined types were placed in this encounter.   Disposition:  Follow up  3 months  Signed, Laurie Lovejoy Fidel Levy, MD, 05/23/2022 5:38 PM    [facility] [address] [place], [place] [address]

## 2022-05-31 ENCOUNTER — Ambulatory Visit (HOSPITAL_COMMUNITY)
Admission: RE | Admit: 2022-05-31 | Discharge: 2022-05-31 | Disposition: A | Discharge status: Home / Self Care | Payer: 59 | Source: Ambulatory Visit | Attending: Internal Medicine | Admitting: Internal Medicine

## 2022-05-31 ENCOUNTER — Telehealth: Payer: Self-pay

## 2022-05-31 DIAGNOSIS — R0609 Other forms of dyspnea: Secondary | ICD-10-CM | POA: Diagnosis not present

## 2022-05-31 LAB — ECHOCARDIOGRAM COMPLETE
AR max vel: 1.9 cm2
AV Area VTI: 1.91 cm2
AV Area mean vel: 1.91 cm2
AV Mean grad: 4 mmHg
AV Peak grad: 8.2 mmHg
Ao pk vel: 1.43 m/s
Area-P 1/2: 5.06 cm2
MV VTI: 2.22 cm2
S' Lateral: 2.8 cm

## 2022-05-31 NOTE — Progress Notes (Signed)
*  PRELIMINARY RESULTS* Echocardiogram 2D Echocardiogram has been performed.  Elpidio Anis 05/31/2022, 9:37 AM

## 2022-05-31 NOTE — Telephone Encounter (Signed)
-----   Message from Chalmers Guest, MD sent at 05/31/2022 10:20 AM EST ----- Normal pumping function of the heart and grade 1 diastolic dysfunction. No major valvular abnormalities. Take Lasix 40 mg once daily (instructed as needed) and add potassium supplements 40 Eq once daily.

## 2022-05-31 NOTE — Telephone Encounter (Signed)
Patient notified and verbalized understanding. PCP copied.   

## 2022-06-08 ENCOUNTER — Encounter (HOSPITAL_COMMUNITY): Payer: Self-pay

## 2022-06-08 ENCOUNTER — Ambulatory Visit (HOSPITAL_COMMUNITY)
Admission: RE | Admit: 2022-06-08 | Discharge: 2022-06-08 | Disposition: A | Discharge status: Home / Self Care | Payer: 59 | Source: Ambulatory Visit | Attending: Internal Medicine | Admitting: Internal Medicine

## 2022-06-08 ENCOUNTER — Encounter (HOSPITAL_COMMUNITY): Payer: 59

## 2022-06-08 DIAGNOSIS — R0609 Other forms of dyspnea: Secondary | ICD-10-CM | POA: Insufficient documentation

## 2022-06-08 HISTORY — DX: Essential (primary) hypertension: I10

## 2022-06-08 HISTORY — DX: Heart failure, unspecified: I50.9

## 2022-06-08 LAB — NM MYOCAR MULTI W/SPECT W/WALL MOTION / EF
Estimated workload: 1
Exercise duration (min): 5 min
Exercise duration (sec): 28 s
LV dias vol: 54 mL (ref 46–106)
LV sys vol: 11 mL
MPHR: 159 {beats}/min
Nuc Stress EF: 80 %
Peak HR: 109 {beats}/min
Percent HR: 68 %
RATE: 0.6
Rest HR: 96 {beats}/min
Rest Nuclear Isotope Dose: 11 mCi
SDS: 0
SRS: 2
SSS: 2
ST Depression (mm): 0 mm
Stress Nuclear Isotope Dose: 31 mCi
TID: 1.29

## 2022-06-08 MED ORDER — TECHNETIUM TC 99M TETROFOSMIN IV KIT
30.0000 | PACK | Freq: Once | INTRAVENOUS | Status: AC | PRN
  Administered 2022-06-08: 31 via INTRAVENOUS

## 2022-06-08 MED ORDER — REGADENOSON 0.4 MG/5ML IV SOLN
INTRAVENOUS | Status: AC
  Administered 2022-06-08: 0.4 mg via INTRAVENOUS
  Filled 2022-06-08: qty 5

## 2022-06-08 MED ORDER — TECHNETIUM TC 99M TETROFOSMIN IV KIT
10.0000 | PACK | Freq: Once | INTRAVENOUS | Status: AC | PRN
  Administered 2022-06-08: 11 via INTRAVENOUS

## 2022-06-08 MED ORDER — SODIUM CHLORIDE FLUSH 0.9 % IV SOLN
INTRAVENOUS | Status: AC
  Administered 2022-06-08: 10 mL via INTRAVENOUS
  Filled 2022-06-08: qty 10

## 2022-08-02 DIAGNOSIS — M545 Low back pain, unspecified: Secondary | ICD-10-CM | POA: Diagnosis not present

## 2022-08-28 ENCOUNTER — Encounter: Payer: Self-pay | Admitting: Internal Medicine

## 2022-08-28 ENCOUNTER — Ambulatory Visit: Payer: 59 | Admitting: Internal Medicine

## 2022-08-28 ENCOUNTER — Ambulatory Visit: Payer: 59 | Attending: Internal Medicine | Admitting: Internal Medicine

## 2022-08-28 VITALS — BP 124/72 | HR 84 | Ht 61.0 in | Wt 156.0 lb

## 2022-08-28 DIAGNOSIS — R9431 Abnormal electrocardiogram [ECG] [EKG]: Secondary | ICD-10-CM | POA: Diagnosis not present

## 2022-08-28 MED ORDER — APIXABAN 5 MG PO TABS: 5.0000 mg | ORAL_TABLET | Freq: Two times a day (BID) | ORAL | 11 refills | Status: DC

## 2022-08-28 MED ORDER — FUROSEMIDE 40 MG PO TABS: 40.0000 mg | ORAL_TABLET | Freq: Every day | ORAL | 11 refills | Status: DC | PRN

## 2022-08-28 NOTE — Patient Instructions (Addendum)
Medication Instructions:  Your physician recommends that you continue on your current medications as directed. Please refer to the Current Medication list given to you today.    Labwork: None   Testing/Procedures: None  Follow-Up: Follow up in 1 year with Dr. Dellia Cloud.   Any Other Special Instructions Will Be Listed Below (If Applicable).     If you need a refill on your cardiac medications before your next appointment, please call your pharmacy.

## 2022-08-28 NOTE — Progress Notes (Signed)
Cardiology Office Note  Date: 08/28/2022   ID: ZYLIA TURSKI, DOB [birth year], MRN [id number]  PCP:  Leslie Andrea, MD  Cardiologist:  Chalmers Guest, MD Electrophysiologist:  None   Reason for Office Visit: DOE   History of Present Illness: [name] is a 62 y.o. female known to have HLD, DM 2, dysphagia likely secondary to esophageal stricture s/p dilatation presented to cardiology clinic for follow-up visit.  Patient was referred to cardiology clinic for evaluation of DOE in 05/2022 when she had DOE for more than 1 year associated with LE swelling which was resolved after taking p.o. Lasix. Echocardiogram and Lexiscan were performed in 05/2022 which showed normal LVEF, no valve abnormalities and no evidence of ischemia.  She presents today for follow-up visit.  Denies any chest pain, DOE, leg swelling, dizziness, lightness, syncope.  She has herniated disc from which she has excruciating pain in her lower extremities and has been affecting her quality of life. She also has rotator cuff tear, send 5% in her right shoulder for which she take steroid shots. She is scheduled to get outpatient physical therapy to see for any improvement in her pain. Denies smoking cigarettes, alcohol use or illicit drug abuse. No prior history of MI/CABG/PCI. No family history of premature ASCVD.  Past Medical History:  Diagnosis Date   Anxiety    CHF (congestive heart failure) (HCC)    DDD (degenerative disc disease)    Diverticular disease    Fibromyalgia    GERD (gastroesophageal reflux disease)    Horseshoe kidney    Hypercholesterolemia    Hypertension    Irritable bowel syndrome    Type 2 diabetes mellitus ([place])     Past Surgical History:  Procedure Laterality Date   BALLOON DILATION N/A 11/07/2021   Procedure: BALLOON DILATION;  Surgeon: [name], DO;  [facility];  Service: Endoscopy;  Laterality: N/A;   BIOPSY  11/07/2021   Procedure: BIOPSY;   Surgeon: [name], DO;  [facility];  Service: Endoscopy;;   cervical dysplasia     COLONOSCOPY  07/01/2009   [id number] seen in the ascending and sigmoid colon/small internal hemorrhoids/6-mm sessile polyp/4-mm sessile polyp.3-mm sessile polyp removed. simple adenomas   COLONOSCOPY N/A 03/25/2013   Procedure: COLONOSCOPY;  Surgeon: Danie Binder, MD;  [facility];  Service: Endoscopy;  Laterality: N/A;  10:15   COLONOSCOPY WITH PROPOFOL N/A 12/10/2018   Dr. Oneida Alar: 44m serrated colon polyp, normal TI, hemorrhoids, diverticulosis. next TCS in five years.    COLONOSCOPY WITH PROPOFOL N/A 11/07/2021   Procedure: COLONOSCOPY WITH PROPOFOL;  Surgeon: CEloise Harman DO;  [facility];  Service: Endoscopy;  Laterality: N/A;  1:30pm   ESOPHAGEAL DILATION N/A 02/04/2015   Procedure: ESOPHAGEAL DILATION;  Surgeon: SDanie Binder MD;  [facility];  Service: Endoscopy;  Laterality: N/A;   ESOPHAGOGASTRODUODENOSCOPY  07/01/2009   [id number]/dilation to 16 mm possible proximal cervical web   ESOPHAGOGASTRODUODENOSCOPY N/A 02/04/2015   Procedure: ESOPHAGOGASTRODUODENOSCOPY (EGD);  Surgeon: SDanie Binder MD;  [facility];  Service: Endoscopy;  Laterality: N/A;  0800-moved to 845 Office to notify pt   ESOPHAGOGASTRODUODENOSCOPY (EGD) WITH ESOPHAGEAL DILATION N/A 03/25/2013   Procedure: ESOPHAGOGASTRODUODENOSCOPY (EGD) WITH ESOPHAGEAL DILATION;  Surgeon: SDanie Binder MD;  [facility];  Service: Endoscopy;  Laterality: N/A;   ESOPHAGOGASTRODUODENOSCOPY (EGD) WITH PROPOFOL  12/10/2018   Dr. FOneida Alar moderate Schatzki ring s/p dilation,  gastritis, nonbleeding duodenal diverticulum   ESOPHAGOGASTRODUODENOSCOPY (EGD) WITH PROPOFOL N/A 11/07/2021   Procedure: ESOPHAGOGASTRODUODENOSCOPY (EGD) WITH PROPOFOL;  Surgeon: [name], DO;  [facility];  Service: Endoscopy;  Laterality: N/A;   herniated  disc     POLYPECTOMY  12/10/2018   Procedure: POLYPECTOMY;  Surgeon: Danie Binder, MD;  [facility];  Service: Endoscopy;;  cold snare sigmoid polyp   POLYPECTOMY  11/07/2021   Procedure: POLYPECTOMY;  Surgeon: [name], DO;  [facility];  Service: Endoscopy;;   SAVORY DILATION  12/10/2018   Procedure: SAVORY DILATION;  Surgeon: Danie Binder, MD;  [facility];  Service: Endoscopy;;   TONSILLECTOMY     TUBAL LIGATION     WRIST SURGERY      Current Outpatient Medications  Medication Sig Dispense Refill   albuterol (VENTOLIN HFA) 108 (90 Base) MCG/ACT inhaler Inhale into the lungs.     amitriptyline (ELAVIL) 10 MG tablet Take 10 mg by mouth at bedtime.     atorvastatin (LIPITOR) 10 MG tablet Take 10 mg by mouth daily.     Ca Carbonate-Mag Hydroxide (ROLAIDS PO) Take by mouth.     cyclobenzaprine (FLEXERIL) 10 MG tablet SMARTSIG:1 Tablet(s) By Mouth 1-3 Times Daily     doxycycline (VIBRAMYCIN) 100 MG capsule Take 100 mg by mouth 2 (two) times daily.     FEROSUL 325 (65 Fe) MG tablet Take 325 mg by mouth daily.     fluticasone (FLONASE) 50 MCG/ACT nasal spray Place 2 sprays into both nostrils daily. 16 g 0   gabapentin (NEURONTIN) 600 MG tablet SMARTSIG:1 Tablet(s) By Mouth 1-3 Times Daily     lubiprostone (AMITIZA) 8 MCG capsule Take 1 capsule (8 mcg total) by mouth 2 (two) times daily with a meal. 60 capsule 2   metFORMIN (GLUCOPHAGE-XR) 500 MG 24 hr tablet Take 500 mg by mouth daily.     methocarbamol (ROBAXIN) 750 MG tablet Take 750 mg by mouth daily as needed for muscle spasms.     omeprazole (PRILOSEC) 20 MG capsule Take 20 mg by mouth as needed (acid reflux).     Potassium Chloride ER 20 MEQ TBCR Take 1 tablet by mouth daily.     Rimegepant Sulfate (NURTEC) 75 MG TBDP Take 75 mg by mouth once.     simethicone (MYLICON) 0000000 MG chewable tablet Chew 125 mg by mouth every 6 (six) hours as needed for flatulence.     furosemide (LASIX) 40 MG  tablet Take 1 tablet (40 mg total) by mouth daily as needed. 30 tablet 11   No current facility-administered medications for this visit.   Allergies:  Penicillins and Codeine   Social History: The patient  reports that she quit smoking about 25 years ago. Her smoking use included cigarettes. She has a 25.00 pack-year smoking history. She has never used smokeless tobacco. She reports that she does not drink alcohol and does not use drugs.   Family History: The patient's family history includes COPD in her brother and mother; Cancer in her mother; Colon cancer in her brother and father; Diabetes in her sister; Heart disease in her mother; Hyperlipidemia in her mother; Hypertension in her father and mother; Irritable bowel syndrome in her mother and sister; Thyroid disease in her mother.   ROS:  Please see the history of present illness. Otherwise, complete review of systems is positive for none.  All other systems are reviewed and negative.   Physical Exam:  VS:  BP 124/72   Pulse 84   Ht 5' 1"$  (1.549 m)   Wt 156 lb (70.8 kg)   SpO2 99%   BMI 29.48 kg/m , BMI Body mass index is 29.48 kg/m.  Wt Readings from Last 3 Encounters:  08/28/22 156 lb (70.8 kg)  05/23/22 165 lb (74.8 kg)  04/11/22 163 lb (73.9 kg)    General: Patient appears comfortable at rest. HEENT: Conjunctiva and lids normal, oropharynx clear with moist mucosa. Neck: Supple, no elevated JVP or carotid bruits, no thyromegaly. Lungs: Clear to auscultation, nonlabored breathing at rest. Cardiac: Regular rate and rhythm, no S3 or significant systolic murmur, no pericardial rub. Abdomen: Soft, nontender, no hepatomegaly, bowel sounds present, no guarding or rebound. Extremities: No pitting edema, distal pulses 2+. Skin: Warm and dry. Musculoskeletal: No kyphosis. Neuropsychiatric: Alert and oriented x3, affect grossly appropriate.  EKG : NSR  Recent Labwork: 10/15/2021: ALT 18; AST 19; BUN 20; Creatinine, Ser 1.11;  Hemoglobin 15.6; Platelets 209; Potassium 3.6; Sodium 137  No results found for: "CHOL", "TRIG", "HDL", "CHOLHDL", "VLDL", "LDLCALC", "LDLDIRECT"  Other Studies Reviewed Today: Echo from 2020 LVEF preserved  Assessment and Plan: Patient is a 62 year old F known to have HLD, DM 2, dysphagia likely secondary to esophageal stricture s/p dilatation presented to cardiology clinic for follow-up visit.  # DOE/HFpEF -DOE symptoms currently resolved after Lasix administration as needed for Sob/LE swelling. Continue Lasix 40 mg daily as needed. Lexiscan from 05/2022 showed no evidence of ischemia. Echocardiogram from 05/2022 showed normal LVEF, G1 DD and no valve abnormalities.  #HLD, unknown values -Continue atorvastatin 10 mg nightly.  Follow-up with PCP for lipid values.  Goal LDL less than 100.  I have spent a total of 33 minutes with patient reviewing chart , EKGs, labs and examining patient as well as establishing an assessment and plan that was discussed with the patient.  > 50% of time was spent in direct patient care.     Medication Adjustments/Labs and Tests Ordered: Current medicines are reviewed at length with the patient today.  Concerns regarding medicines are outlined above.   Tests Ordered: No orders of the defined types were placed in this encounter.   Medication Changes: Meds ordered this encounter  Medications   DISCONTD: apixaban (ELIQUIS) 5 MG TABS tablet    Sig: Take 1 tablet (5 mg total) by mouth 2 (two) times daily.    Dispense:  60 tablet    Refill:  11   furosemide (LASIX) 40 MG tablet    Sig: Take 1 tablet (40 mg total) by mouth daily as needed.    Dispense:  30 tablet    Refill:  11    Disposition:  Follow up 1 year  Signed, Damarie Schoolfield Fidel Levy, MD, 08/28/2022 9:00 AM    [facility] [place]. [address], [place], [place] [address]

## 2022-11-21 ENCOUNTER — Encounter: Payer: Self-pay | Admitting: Gastroenterology

## 2022-11-21 ENCOUNTER — Ambulatory Visit (INDEPENDENT_AMBULATORY_CARE_PROVIDER_SITE_OTHER): Payer: 59 | Admitting: Gastroenterology

## 2022-11-21 VITALS — BP 136/73 | HR 93 | Temp 97.8°F | Ht 61.5 in | Wt 156.7 lb

## 2022-11-21 DIAGNOSIS — K625 Hemorrhage of anus and rectum: Secondary | ICD-10-CM

## 2022-11-21 DIAGNOSIS — K5900 Constipation, unspecified: Secondary | ICD-10-CM

## 2022-11-21 DIAGNOSIS — K642 Third degree hemorrhoids: Secondary | ICD-10-CM | POA: Diagnosis not present

## 2022-11-21 MED ORDER — LUBIPROSTONE 8 MCG PO CAPS: 8.0000 ug | ORAL_CAPSULE | Freq: Two times a day (BID) | ORAL | 2 refills | Status: DC
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
## 2022-11-21 NOTE — Patient Instructions (Addendum)
Use over the coutner hemorrhoid cream (preparation H) 2-3 times daily for 7 days. If no improvement in rectal bleeding please call the office and make an appointment for banding of your right sided hemorrhoids.   Please let me know if it worsens or if you develop any significant fatigue and lightheadedness as we would need to recheck your hemoglobin.   Start amitiza 8 mcg BID with food.  It was a pleasure to see you today. I want to create trusting relationships with patients. If you receive a survey regarding your visit,  I greatly appreciate you taking time to fill this out on paper or through your MyChart. I value your feedback.  Brooke Bonito, MSN, FNP-BC, AGACNP-BC [facility]

## 2022-11-29 DIAGNOSIS — M25511 Pain in right shoulder: Secondary | ICD-10-CM | POA: Diagnosis not present

## 2022-11-29 DIAGNOSIS — G4700 Insomnia, unspecified: Secondary | ICD-10-CM | POA: Diagnosis not present

## 2022-11-29 DIAGNOSIS — M545 Low back pain, unspecified: Secondary | ICD-10-CM | POA: Diagnosis not present

## 2022-11-29 DIAGNOSIS — Z79891 Long term (current) use of opiate analgesic: Secondary | ICD-10-CM | POA: Diagnosis not present

## 2022-12-18 ENCOUNTER — Ambulatory Visit: Payer: 59 | Admitting: Orthopedic Surgery

## 2022-12-18 ENCOUNTER — Encounter: Payer: Self-pay | Admitting: Orthopedic Surgery

## 2022-12-18 ENCOUNTER — Ambulatory Visit (INDEPENDENT_AMBULATORY_CARE_PROVIDER_SITE_OTHER): Payer: 59

## 2022-12-18 VITALS — BP 168/89 | HR 118 | Ht 62.0 in | Wt 154.0 lb

## 2022-12-18 DIAGNOSIS — M75111 Incomplete rotator cuff tear or rupture of right shoulder, not specified as traumatic: Secondary | ICD-10-CM

## 2022-12-18 DIAGNOSIS — G8929 Other chronic pain: Secondary | ICD-10-CM | POA: Diagnosis not present

## 2022-12-18 DIAGNOSIS — M25511 Pain in right shoulder: Secondary | ICD-10-CM | POA: Diagnosis not present
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
## 2022-12-18 NOTE — Progress Notes (Signed)
Call [facility] for more records from therapy sessions--TL

## 2022-12-19 ENCOUNTER — Ambulatory Visit: Payer: 59 | Admitting: Gastroenterology

## 2023-01-18 ENCOUNTER — Ambulatory Visit: Payer: 59 | Admitting: Orthopedic Surgery

## 2023-09-10 ENCOUNTER — Ambulatory Visit: Payer: 59 | Admitting: Internal Medicine

## 2023-09-20 ENCOUNTER — Ambulatory Visit: Payer: Self-pay | Admitting: Internal Medicine

## 2023-12-11 ENCOUNTER — Ambulatory Visit: Payer: Self-pay

## 2023-12-11 VITALS — BP 137/88 | HR 92 | Ht 62.0 in | Wt 155.1 lb

## 2023-12-11 DIAGNOSIS — E7849 Other hyperlipidemia: Secondary | ICD-10-CM | POA: Diagnosis not present

## 2023-12-11 DIAGNOSIS — M7989 Other specified soft tissue disorders: Secondary | ICD-10-CM

## 2023-12-11 DIAGNOSIS — R739 Hyperglycemia, unspecified: Secondary | ICD-10-CM | POA: Diagnosis not present

## 2023-12-11 DIAGNOSIS — M5441 Lumbago with sciatica, right side: Secondary | ICD-10-CM

## 2023-12-11 DIAGNOSIS — M5442 Lumbago with sciatica, left side: Secondary | ICD-10-CM

## 2023-12-11 DIAGNOSIS — M542 Cervicalgia: Secondary | ICD-10-CM | POA: Diagnosis not present

## 2023-12-11 DIAGNOSIS — G8929 Other chronic pain: Secondary | ICD-10-CM

## 2023-12-11 DIAGNOSIS — K581 Irritable bowel syndrome with constipation: Secondary | ICD-10-CM

## 2023-12-11 DIAGNOSIS — F5101 Primary insomnia: Secondary | ICD-10-CM

## 2023-12-11 DIAGNOSIS — D508 Other iron deficiency anemias: Secondary | ICD-10-CM

## 2023-12-11 DIAGNOSIS — K219 Gastro-esophageal reflux disease without esophagitis: Secondary | ICD-10-CM

## 2023-12-11 DIAGNOSIS — M549 Dorsalgia, unspecified: Secondary | ICD-10-CM

## 2023-12-11 DIAGNOSIS — E876 Hypokalemia: Secondary | ICD-10-CM

## 2023-12-11 MED ORDER — AMITRIPTYLINE HCL 10 MG PO TABS
10.0000 mg | ORAL_TABLET | Freq: Every day | ORAL | 1 refills | Status: AC
Start: 2023-12-11 — End: ?

## 2023-12-11 MED ORDER — GABAPENTIN 600 MG PO TABS
600.0000 mg | ORAL_TABLET | Freq: Every day | ORAL | 1 refills | Status: AC
Start: 2023-12-11 — End: ?

## 2023-12-11 MED ORDER — DICLOFENAC SODIUM 75 MG PO TBEC
75.0000 mg | DELAYED_RELEASE_TABLET | Freq: Two times a day (BID) | ORAL | 5 refills | Status: AC | PRN
Start: 2023-12-11 — End: ?

## 2023-12-11 MED ORDER — FUROSEMIDE 40 MG PO TABS: 40.0000 mg | ORAL_TABLET | Freq: Every day | ORAL | 5 refills | Status: AC | PRN

## 2023-12-11 MED ORDER — OMEPRAZOLE 20 MG PO CPDR: 20.0000 mg | DELAYED_RELEASE_CAPSULE | Freq: Every day | ORAL | 3 refills | Status: AC

## 2023-12-11 MED ORDER — ATORVASTATIN CALCIUM 10 MG PO TABS
10.0000 mg | ORAL_TABLET | Freq: Every day | ORAL | 1 refills | Status: AC
Start: 2023-12-11 — End: ?

## 2023-12-11 MED ORDER — LUBIPROSTONE 8 MCG PO CAPS
8.0000 ug | ORAL_CAPSULE | Freq: Two times a day (BID) | ORAL | 5 refills | Status: AC
Start: 2023-12-11 — End: ?

## 2023-12-11 MED ORDER — POTASSIUM CHLORIDE ER 20 MEQ PO TBCR: 1.0000 | EXTENDED_RELEASE_TABLET | Freq: Every day | ORAL | 1 refills | Status: AC

## 2023-12-11 MED ORDER — METHOCARBAMOL 750 MG PO TABS
750.0000 mg | ORAL_TABLET | Freq: Three times a day (TID) | ORAL | 5 refills | Status: AC | PRN
Start: 2023-12-11 — End: ?

## 2023-12-11 MED ORDER — CYCLOBENZAPRINE HCL 10 MG PO TABS
10.0000 mg | ORAL_TABLET | Freq: Three times a day (TID) | ORAL | 2 refills | Status: AC | PRN
Start: 2023-12-11 — End: ?

## 2023-12-11 NOTE — Progress Notes (Unsigned)
 New Patient Office Visit  Subjective    Patient ID: [name], female    DOB: Feb 21, 1961  Age: 63 y.o. MRN: [id number]  CC:  Chief Complaint  Patient presents with   Establish Care    Pt here to establish care    HPI [name] presents to establish care Get refills on chronic medications and recheck levels.   Outpatient Encounter Medications as of 12/11/2023  Medication Sig   Ca Carbonate-Mag Hydroxide (ROLAIDS PO) Take by mouth.   omeprazole  (PRILOSEC) 20 MG capsule Take 20 mg by mouth daily.   albuterol  (VENTOLIN  HFA) 108 (90 Base) MCG/ACT inhaler Inhale into the lungs. (Patient not taking: Reported on 12/11/2023)   amitriptyline (ELAVIL) 10 MG tablet Take 10 mg by mouth at bedtime. (Patient not taking: Reported on 12/11/2023)   atorvastatin (LIPITOR) 10 MG tablet Take 10 mg by mouth daily. (Patient not taking: Reported on 12/11/2023)   cyclobenzaprine  (FLEXERIL ) 10 MG tablet SMARTSIG:1 Tablet(s) By Mouth 1-3 Times Daily (Patient not taking: Reported on 12/11/2023)   FEROSUL 325 (65 Fe) MG tablet Take 325 mg by mouth daily. (Patient not taking: Reported on 12/11/2023)   furosemide  (LASIX ) 40 MG tablet Take 1 tablet (40 mg total) by mouth daily as needed. (Patient not taking: Reported on 12/11/2023)   gabapentin (NEURONTIN) 600 MG tablet SMARTSIG:1 Tablet(s) By Mouth 1-3 Times Daily (Patient not taking: Reported on 12/11/2023)   HYDROcodone -acetaminophen  (NORCO/VICODIN) 5-325 MG tablet SMARTSIG:1 Tablet(s) By Mouth 1-3 Times Daily (Patient not taking: Reported on 12/11/2023)   LORazepam (ATIVAN) 1 MG tablet Take 0.5-1 mg by mouth at bedtime. (Patient not taking: Reported on 12/11/2023)   lubiprostone  (AMITIZA ) 8 MCG capsule Take 1 capsule (8 mcg total) by mouth 2 (two) times daily with a meal. (Patient not taking: Reported on 12/11/2023)   metFORMIN  (GLUCOPHAGE -XR) 500 MG 24 hr tablet Take 500 mg by mouth daily. (Patient not taking: Reported on 12/11/2023)   methocarbamol (ROBAXIN) 750 MG  tablet Take 750 mg by mouth daily as needed for muscle spasms. (Patient not taking: Reported on 12/11/2023)   OVER THE COUNTER MEDICATION Hydrocodone  5-325 mg tid prn (Patient not taking: Reported on 12/11/2023)   Potassium Chloride  ER 20 MEQ TBCR Take 1 tablet by mouth daily. (Patient not taking: Reported on 12/11/2023)   Rimegepant Sulfate (NURTEC) 75 MG TBDP Take 75 mg by mouth once. (Patient not taking: Reported on 12/11/2023)   simethicone  (MYLICON) 125 MG chewable tablet Chew 125 mg by mouth every 6 (six) hours as needed for flatulence. (Patient not taking: Reported on 12/11/2023)   No facility-administered encounter medications on file as of 12/11/2023.    Past Medical History:  Diagnosis Date   Anxiety    CHF (congestive heart failure) (HCC)    DDD (degenerative disc disease)    Diverticular disease    Fibromyalgia    GERD (gastroesophageal reflux disease)    Horseshoe kidney    Hypercholesterolemia    Hypertension    Irritable bowel syndrome    Type 2 diabetes mellitus (HCC)     Past Surgical History:  Procedure Laterality Date   BALLOON DILATION N/A 11/07/2021   Procedure: BALLOON DILATION;  Surgeon: Vinetta Greening, DO;  [facility]: [facility];  Service: Endoscopy;  Laterality: N/A;   BIOPSY  11/07/2021   Procedure: BIOPSY;  Surgeon: Vinetta Greening, DO;  [facility]: [facility];  Service: Endoscopy;;   cervical dysplasia     COLONOSCOPY  07/01/2009   [id number] seen  in the ascending and sigmoid colon/small internal hemorrhoids/6-mm sessile polyp/4-mm sessile polyp.3-mm sessile polyp removed. simple adenomas   COLONOSCOPY N/A 03/25/2013   Procedure: COLONOSCOPY;  Surgeon: Alyce Jubilee, MD;  Location: [facility];  Service: Endoscopy;  Laterality: N/A;  10:15   COLONOSCOPY WITH PROPOFOL  N/A 12/10/2018   Dr. Nolene Baumgarten: 4mm serrated colon polyp, normal TI, hemorrhoids, diverticulosis. next TCS in five years.    COLONOSCOPY WITH PROPOFOL  N/A 11/07/2021   Procedure: COLONOSCOPY  WITH PROPOFOL ;  Surgeon: Vinetta Greening, DO;  [facility]: [facility];  Service: Endoscopy;  Laterality: N/A;  1:30pm   ESOPHAGEAL DILATION N/A 02/04/2015   Procedure: ESOPHAGEAL DILATION;  Surgeon: Alyce Jubilee, MD;  [facility]: [facility];  Service: Endoscopy;  Laterality: N/A;   ESOPHAGOGASTRODUODENOSCOPY  07/01/2009   ZOX:WRUEAV esophagus/dilation to 16 mm possible proximal cervical web   ESOPHAGOGASTRODUODENOSCOPY N/A 02/04/2015   Procedure: ESOPHAGOGASTRODUODENOSCOPY (EGD);  Surgeon: Alyce Jubilee, MD;  [facility]: [facility];  Service: Endoscopy;  Laterality: N/A;  0800-moved to 845 Office to notify pt   ESOPHAGOGASTRODUODENOSCOPY (EGD) WITH ESOPHAGEAL DILATION N/A 03/25/2013   Procedure: ESOPHAGOGASTRODUODENOSCOPY (EGD) WITH ESOPHAGEAL DILATION;  Surgeon: Alyce Jubilee, MD;  [facility]: [facility];  Service: Endoscopy;  Laterality: N/A;   ESOPHAGOGASTRODUODENOSCOPY (EGD) WITH PROPOFOL   12/10/2018   Dr. Nolene Baumgarten: moderate Schatzki ring s/p dilation, gastritis, nonbleeding duodenal diverticulum   ESOPHAGOGASTRODUODENOSCOPY (EGD) WITH PROPOFOL  N/A 11/07/2021   Procedure: ESOPHAGOGASTRODUODENOSCOPY (EGD) WITH PROPOFOL ;  Surgeon: Vinetta Greening, DO;  [facility]: [facility];  Service: Endoscopy;  Laterality: N/A;   herniated disc     POLYPECTOMY  12/10/2018   Procedure: POLYPECTOMY;  Surgeon: Alyce Jubilee, MD;  [facility];  Service: Endoscopy;;  cold snare sigmoid polyp   POLYPECTOMY  11/07/2021   Procedure: POLYPECTOMY;  Surgeon: Vinetta Greening, DO;  [facility];  Service: Endoscopy;;   SAVORY DILATION  12/10/2018   Procedure: SAVORY DILATION;  Surgeon: Alyce Jubilee, MD;  [facility]: [facility];  Service: Endoscopy;;   TONSILLECTOMY     TUBAL LIGATION     WRIST SURGERY      Family History  Problem Relation Age of Onset   Colon cancer Brother        mid 31s, deceased   Colon cancer Father        diagnosed at 90, deceased   Hypertension  Father    Hypertension Mother    Hyperlipidemia Mother    COPD Mother    Cancer Mother        lung   Heart disease Mother    Thyroid disease Mother    Irritable bowel syndrome Mother    Diabetes Sister    Irritable bowel syndrome Sister    COPD Brother     Social History   Socioeconomic History   Marital status: Legally Separated    Spouse name: Not on file   Number of children: Not on file   Years of education: Not on file   Highest education level: Not on file  Occupational History   Occupation: Musician: [employer]    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  CNA at [employer].     Social Drivers of Corporate investment banker Strain: Not on file  Food Insecurity: Not on file  Transportation Needs: Not on file  Physical Activity: Not on file  Stress: Not on file  Social Connections: Not on file  Intimate Partner Violence: Not on file    ROS      Objective    BP 137/88   Pulse 92   Ht 5\' 2"  (1.575 m)   Wt 155 lb 1.3 oz (70.3 kg)   SpO2 96%   BMI 28.36 kg/m   Physical Exam  {Labs (Optional):23779}    Assessment & Plan:   Problem List Items Addressed This Visit   None   No follow-ups on file.   Alison Irvine, FNP

## 2023-12-12 ENCOUNTER — Ambulatory Visit: Payer: Self-pay

## 2023-12-12 DIAGNOSIS — D508 Other iron deficiency anemias: Secondary | ICD-10-CM | POA: Insufficient documentation

## 2023-12-12 DIAGNOSIS — F5101 Primary insomnia: Secondary | ICD-10-CM | POA: Insufficient documentation

## 2023-12-12 LAB — CBC
Hematocrit: 40.4 % (ref 34.0–46.6)
Hemoglobin: 13.6 g/dL (ref 11.1–15.9)
MCH: 31.3 pg (ref 26.6–33.0)
MCHC: 33.7 g/dL (ref 31.5–35.7)
MCV: 93 fL (ref 79–97)
Platelets: 179 10*3/uL (ref 150–450)
RBC: 4.34 x10E6/uL (ref 3.77–5.28)
RDW: 14.5 % (ref 11.7–15.4)
WBC: 6.9 10*3/uL (ref 3.4–10.8)

## 2023-12-12 LAB — CMP14+EGFR
ALT: 20 IU/L (ref 0–32)
AST: 17 IU/L (ref 0–40)
Albumin: 4.6 g/dL (ref 3.9–4.9)
Alkaline Phosphatase: 138 IU/L — ABNORMAL HIGH (ref 44–121)
BUN/Creatinine Ratio: 10 — ABNORMAL LOW (ref 12–28)
BUN: 7 mg/dL — ABNORMAL LOW (ref 8–27)
Bilirubin Total: 0.4 mg/dL (ref 0.0–1.2)
CO2: 21 mmol/L (ref 20–29)
Calcium: 10.3 mg/dL (ref 8.7–10.3)
Chloride: 100 mmol/L (ref 96–106)
Creatinine, Ser: 0.72 mg/dL (ref 0.57–1.00)
Globulin, Total: 2.5 g/dL (ref 1.5–4.5)
Glucose: 182 mg/dL — ABNORMAL HIGH (ref 70–99)
Potassium: 4 mmol/L (ref 3.5–5.2)
Sodium: 139 mmol/L (ref 134–144)
Total Protein: 7.1 g/dL (ref 6.0–8.5)
eGFR: 94 mL/min/{1.73_m2} (ref >59)

## 2023-12-12 LAB — HEMOGLOBIN A1C
Est. average glucose Bld gHb Est-mCnc: 137 mg/dL
Hgb A1c MFr Bld: 6.4 % — ABNORMAL HIGH (ref 4.8–5.6)

## 2023-12-12 LAB — LIPID PANEL
Chol/HDL Ratio: 4.2 ratio (ref 0.0–4.4)
Cholesterol, Total: 237 mg/dL — ABNORMAL HIGH (ref 100–199)
HDL: 56 mg/dL (ref >39)
LDL Chol Calc (NIH): 135 mg/dL — ABNORMAL HIGH (ref 0–99)
Triglycerides: 257 mg/dL — ABNORMAL HIGH (ref 0–149)
VLDL Cholesterol Cal: 46 mg/dL — ABNORMAL HIGH (ref 5–40)

## 2023-12-12 LAB — IRON,TIBC AND FERRITIN PANEL
Ferritin: 49 ng/mL (ref 15–150)
Iron Saturation: 19 % (ref 15–55)
Iron: 77 ug/dL (ref 27–139)
Total Iron Binding Capacity: 407 ug/dL (ref 250–450)
UIBC: 330 ug/dL (ref 118–369)

## 2023-12-12 NOTE — Assessment & Plan Note (Signed)
 Will check labs today

## 2023-12-12 NOTE — Assessment & Plan Note (Signed)
 Stable with current medications CONTINUE OMEPRAZOLE .  TAKE 30 MINUTES PRIOR TO YOUR FIRST MEAL.   PEPCID  HELPS MOST WHEN USED AS NEEDED. USE WHEN YOU HAVE BREAKTHROUGH HEARTBURN/REFLUX. FOLLOW UP IN 6 months

## 2023-12-12 NOTE — Assessment & Plan Note (Signed)
 Stable with Amitiza .  Recommend increasing fiber and water  intake and getting regular exercise.

## 2023-12-12 NOTE — Assessment & Plan Note (Signed)
 Chronic in nature.  She was told by neurosurgeon that she needed to have surgery but she is terrified to do so.  Symptoms are managed with current medications.  Continue dosages.  Recommend follow-up with neurosurgeon to see if nonsurgical treatment options are available

## 2023-12-12 NOTE — Assessment & Plan Note (Signed)
 Agreed to refill Lasix  for as needed swelling and lower legs.  Recommend she wear compression socks while at work to help with swelling.

## 2023-12-12 NOTE — Assessment & Plan Note (Signed)
 Check labs for further evaluation.  Follow-up according to lab results.

## 2023-12-12 NOTE — Assessment & Plan Note (Signed)
 Check labs to determine effectiveness of current treatment.  Discussed need for healthy, low-fat diet.  Continue with current dose of Lipitor.  Adjust dose if labs indicate.

## 2023-12-12 NOTE — Assessment & Plan Note (Signed)
 Stable with current dose of amitriptyline.

## 2024-04-25 ENCOUNTER — Other Ambulatory Visit: Payer: Self-pay

## 2024-04-25 ENCOUNTER — Telehealth: Payer: Self-pay

## 2024-04-25 DIAGNOSIS — R748 Abnormal levels of other serum enzymes: Secondary | ICD-10-CM

## 2024-04-25 NOTE — Telephone Encounter (Signed)
Ultrasound reordered.

## 2024-04-25 NOTE — Telephone Encounter (Signed)
 Copied from CRM [id number]. Topic: Clinical - Request for Lab/Test Order >> Apr 25, 2024  8:12 AM [name] wrote: Reason for CRM: patient calling stating she did not know an US  for her liver was ordered in 12/12/23. Patient did not know about this order until she got onto MyChart.  Patient would like to have the US  ordered,   Note on 12/12/23   Also, your alkaline phosphatase level was elevated, therefore I would like to get an ultrasound of your liver for further evaluation of this.  I will put in an order for the ultrasound and someone should reach out to you to schedule.   Written by Leita Longs, FNP on 12/12/2023  1:11 PM EDT  Patient was informed they will receive a call back within 1 business day.

## 2024-04-28 NOTE — Telephone Encounter (Signed)
 Noted

## 2024-05-05 ENCOUNTER — Ambulatory Visit (HOSPITAL_COMMUNITY)
Admission: RE | Admit: 2024-05-05 | Discharge: 2024-05-05 | Disposition: A | Discharge status: Home / Self Care | Source: Ambulatory Visit

## 2024-05-05 DIAGNOSIS — R748 Abnormal levels of other serum enzymes: Secondary | ICD-10-CM | POA: Diagnosis present

## 2024-05-21 ENCOUNTER — Ambulatory Visit: Payer: Self-pay
